# Patient Record
Sex: Male | Born: 1965 | Race: White | Hispanic: Yes | Marital: Married | Smoking: Former smoker
Health system: Southern US, Community
[De-identification: ages and names within clinical notes are randomized; demographics above are authoritative.]

## PROBLEM LIST (undated history)

## (undated) DIAGNOSIS — E785 Hyperlipidemia, unspecified: Secondary | ICD-10-CM

## (undated) DIAGNOSIS — Z952 Presence of prosthetic heart valve: Secondary | ICD-10-CM

## (undated) DIAGNOSIS — I350 Nonrheumatic aortic (valve) stenosis: Secondary | ICD-10-CM

## (undated) DIAGNOSIS — R011 Cardiac murmur, unspecified: Secondary | ICD-10-CM

## (undated) DIAGNOSIS — I209 Angina pectoris, unspecified: Secondary | ICD-10-CM

## (undated) DIAGNOSIS — E7800 Pure hypercholesterolemia, unspecified: Secondary | ICD-10-CM

## (undated) DIAGNOSIS — E119 Type 2 diabetes mellitus without complications: Secondary | ICD-10-CM

## (undated) DIAGNOSIS — E669 Obesity, unspecified: Secondary | ICD-10-CM

## (undated) HISTORY — DX: Presence of prosthetic heart valve: Z95.2

## (undated) HISTORY — DX: Cardiac murmur, unspecified: R01.1

## (undated) HISTORY — PX: CARDIAC CATHETERIZATION: SHX172

## (undated) HISTORY — DX: Type 2 diabetes mellitus without complications: E11.9

## (undated) HISTORY — PX: LEG SURGERY: SHX1003

## (undated) HISTORY — DX: Hyperlipidemia, unspecified: E78.5

## (undated) HISTORY — DX: Nonrheumatic aortic (valve) stenosis: I35.0

## (undated) HISTORY — DX: Obesity, unspecified: E66.9

---
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
## 2003-10-30 ENCOUNTER — Emergency Department (HOSPITAL_COMMUNITY): Admission: EM | Admit: 2003-10-30 | Discharge: 2003-10-30 | Payer: Self-pay | Admitting: Emergency Medicine

## 2012-10-26 ENCOUNTER — Encounter: Payer: Self-pay | Admitting: Cardiovascular Disease

## 2012-10-26 ENCOUNTER — Ambulatory Visit (INDEPENDENT_AMBULATORY_CARE_PROVIDER_SITE_OTHER): Payer: Self-pay | Admitting: Cardiovascular Disease

## 2012-10-26 VITALS — BP 100/62 | HR 62 | Ht 65.0 in | Wt 203.2 lb

## 2012-10-26 DIAGNOSIS — R011 Cardiac murmur, unspecified: Secondary | ICD-10-CM

## 2012-10-26 DIAGNOSIS — I359 Nonrheumatic aortic valve disorder, unspecified: Secondary | ICD-10-CM

## 2012-10-26 DIAGNOSIS — R079 Chest pain, unspecified: Secondary | ICD-10-CM | POA: Insufficient documentation

## 2012-10-26 DIAGNOSIS — I350 Nonrheumatic aortic (valve) stenosis: Secondary | ICD-10-CM

## 2012-10-26 NOTE — Assessment & Plan Note (Signed)
Somewhat worrisime in setting of AS but quality is atypical.  If AS not servere on echo can f/u with ETT since ECG is normal

## 2012-10-26 NOTE — Assessment & Plan Note (Signed)
Likely bicuspid valve. Moderate AS on exam  F/U echo Used model to discuss diagnosis with patient through intepreter

## 2012-10-26 NOTE — Progress Notes (Signed)
Patient ID: Kevin Barron, male   DOB: 09/06/1965, 47 y.o.   MRN: [id number] 47 [facility] and history of murmur.  SSCP last 8 months. No rhyme or reason to onset. Not always exertional. Center of chest and radiates to arms and neck.  Sharp quality.  No dyspnea. Occasional dizzyness.  Indicates history of murmur but no w/u or [place].  Seen at clinic 10/21/12 and given nitro and started on beta blocker.  Pains can be intermitant most of the day. 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  History done through spanish intepreter.  No syncope He enjoys golf and can do this without difficulty.  Has not tried anything to relieve pain  ROS: Denies fever, malais, weight loss, blurry vision, decreased visual acuity, cough, sputum, SOB, hemoptysis, pleuritic pain, palpitaitons, heartburn, abdominal pain, melena, lower extremity edema, claudication, or rash.  All other systems reviewed and negative   General: Affect appropriate Healthy:  appears stated age HEENT: normal Neck supple with no adenopathy JVP normal no bruits no thyromegaly Lungs clear with no wheezing and good diaphragmatic motion Heart:  S1/S2 oreserved niderate AS  murmur,rub, gallop or click PMI normal Abdomen: benighn, BS positve, no tenderness, no AAA no bruit.  No HSM or HJR Distal pulses intact with no bruits No edema Neuro non-focal Skin warm and dry No muscular weakness  Medications Current Outpatient Prescriptions  Medication Sig Dispense Refill  . metoprolol tartrate (LOPRESSOR) 25 MG tablet Take 25 mg by mouth 2 (two) times daily.      . nitroGLYCERIN (NITROSTAT) 0.4 MG SL tablet Place 0.4 mg under the tongue every 5 (five) minutes as needed for chest pain.       No current facility-administered medications for this visit.    Allergies Review of patient's allergies indicates not on file.  Family History: No family history on file.  Social History: History   Social  History  . Marital Status: Married    Spouse Name: N/A    Number of Children: N/A  . Years of Education: N/A   Occupational History  . Not on file.   Social History Main Topics  . Smoking status: Never Smoker   . Smokeless tobacco: Not on file  . Alcohol Use: Not on file  . Drug Use: Not on file  . Sexually Active: Not on file   Other Topics Concern  . Not on file   Social History Narrative  . No narrative on file    Electrocardiogram:  10/21/12  SR rate 69  normal  Assessment and Plan

## 2012-10-26 NOTE — Patient Instructions (Addendum)
Your physician has requested that you have an echocardiogram. Echocardiography is a painless test that uses sound waves to create images of your heart. It provides your doctor with information about the size and shape of your heart and how well your heart's chambers and valves are working. This procedure takes approximately one hour. There are no restrictions for this procedure.  Schedule treadmill 2 days after echo   Your physician wants you to follow-up in: 6months. You will receive a reminder letter in the mail two months in advance. If you don't receive a letter, please call our office to schedule the follow-up appointment.

## 2012-10-28 ENCOUNTER — Encounter (HOSPITAL_COMMUNITY): Payer: Self-pay

## 2012-10-28 ENCOUNTER — Ambulatory Visit (HOSPITAL_COMMUNITY)
Admission: RE | Admit: 2012-10-28 | Discharge: 2012-10-28 | Disposition: A | Discharge status: Home / Self Care | Payer: Self-pay | Source: Ambulatory Visit | Attending: Cardiovascular Disease | Admitting: Cardiovascular Disease

## 2012-10-28 DIAGNOSIS — I359 Nonrheumatic aortic valve disorder, unspecified: Secondary | ICD-10-CM

## 2012-10-28 DIAGNOSIS — I350 Nonrheumatic aortic (valve) stenosis: Secondary | ICD-10-CM

## 2012-10-28 DIAGNOSIS — R011 Cardiac murmur, unspecified: Secondary | ICD-10-CM | POA: Insufficient documentation

## 2012-10-28 DIAGNOSIS — Z87891 Personal history of nicotine dependence: Secondary | ICD-10-CM | POA: Insufficient documentation

## 2012-10-28 DIAGNOSIS — R079 Chest pain, unspecified: Secondary | ICD-10-CM | POA: Insufficient documentation

## 2012-10-28 NOTE — Progress Notes (Signed)
*  PRELIMINARY RESULTS* Echocardiogram 2D Echocardiogram  has been performed.  Kevin Barron 10/28/2012, 9:13 AM   Veda Canning, RDCS

## 2012-10-30 ENCOUNTER — Ambulatory Visit (HOSPITAL_COMMUNITY)
Admission: RE | Admit: 2012-10-30 | Discharge: 2012-10-30 | Disposition: A | Discharge status: Home / Self Care | Payer: Self-pay | Source: Ambulatory Visit | Attending: Cardiovascular Disease | Admitting: Cardiovascular Disease

## 2012-10-30 DIAGNOSIS — R079 Chest pain, unspecified: Secondary | ICD-10-CM

## 2012-10-30 DIAGNOSIS — I350 Nonrheumatic aortic (valve) stenosis: Secondary | ICD-10-CM

## 2012-10-30 DIAGNOSIS — R011 Cardiac murmur, unspecified: Secondary | ICD-10-CM | POA: Insufficient documentation

## 2012-10-30 NOTE — Progress Notes (Addendum)
Stress Lab Nurses Notes - Aniketh Huberty Hernandez-Garcia 10/30/2012 Reason for doing test: Chest Pain and & Murmur Type of test: Regular GTX Nurse performing test: Parke Poisson, RN Nuclear Medicine Tech: Not Applicable Echo Tech: Not Applicable MD performing test: R. Keely Drennan & Joni Reining NP Family [facility]: [facility] Test explained and consent signed: yes IV started: No IV started Symptoms: left leg pain Treatment/Intervention: None Reason test stopped: fatigue & Left leg pain After recovery IV was: na Patient to return to Nuc. Med at : NA Patient discharged: Home Patient's Condition upon discharge was: stable Comments: During test peak BP 160/79 & HR 133.  Recovery BP 121/66 & HR 89.  Symptoms resolved in recovery. Erskine Speed T  Graded Exercise Test-Interpretation      Graded exercise performed to a work load of 10.1 METs and a heart rate of 133, 77 % of age-predicted maximum.  Exercise discontinued due to generalized and leg fatigue; no chest discomfort reported.  Blood pressure increased from a resting value of 100/70 to 160/80 at peak exercise.  No significant arrhythmias noted-few PACs and PVCs.  EKG: Normal sinus rhythm; right ventricular conduction delay; modest J-point elevation; otherwise normal. Stress EKG:  No significant change Impression:  Negative graded exercise test at a somewhat submaximal heart rate but fairly good level of exercise  Other findings as noted.  Sabana Eneas Bing, M.D.

## 2013-05-20 ENCOUNTER — Encounter: Payer: Self-pay | Admitting: *Deleted

## 2013-05-24 ENCOUNTER — Encounter: Payer: Self-pay | Admitting: Cardiovascular Disease

## 2013-05-24 ENCOUNTER — Encounter: Payer: Self-pay | Admitting: *Deleted

## 2013-05-24 ENCOUNTER — Ambulatory Visit (INDEPENDENT_AMBULATORY_CARE_PROVIDER_SITE_OTHER): Payer: Self-pay | Admitting: Cardiovascular Disease

## 2013-05-24 VITALS — BP 110/80 | HR 66 | Ht 69.0 in | Wt 189.0 lb

## 2013-05-24 DIAGNOSIS — R011 Cardiac murmur, unspecified: Secondary | ICD-10-CM

## 2013-05-24 DIAGNOSIS — I350 Nonrheumatic aortic (valve) stenosis: Secondary | ICD-10-CM

## 2013-05-24 DIAGNOSIS — R079 Chest pain, unspecified: Secondary | ICD-10-CM

## 2013-05-24 DIAGNOSIS — I359 Nonrheumatic aortic valve disorder, unspecified: Secondary | ICD-10-CM

## 2013-05-24 NOTE — Progress Notes (Signed)
Patient ID: Kevin Barron, male   DOB: 1966/04/13, 47 y.o.   MRN: [id number] 46 yo [facility] April 2014  for chest pain and history of murmur. SSCP last 8 months. No rhyme or reason to onset. Not always exertional. Center of chest and radiates to arms and neck. Sharp quality. No dyspnea. Occasional dizzyness. Indicates history of murmur but no w/u or [place]. Seen at clinic 10/21/12 and given nitro and started on beta blocker. Pains can be intermitant most of the day. 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 History done through spanish intepreter. No syncope He enjoys golf and can do this without difficulty. Has not tried anything to relieve pain  F/U echo : with bicuspid AV with moderate AS mean gradient 16 and peak 31 4"14 10/30/12 Had normal ETT    - Left ventricle: The cavity size was normal. Wall thickness was at the upper limits of normal. Systolic function was normal. The estimated ejection fraction was in the range of 60% to 65%. Wall motion was normal; there were no regional wall motion abnormalities. Features are consistent with a pseudonormal left ventricular filling pattern, with concomitant abnormal relaxation and increased filling pressure (grade 2 diastolic dysfunction). - Aortic valve: Moderately calcified annulus. Bicuspid. There was moderate stenosis. Trivial regurgitation. Mean gradient: 16mm Hg (S). Peak gradient: 31mm Hg (S). Valve area: 0.91cm^2(VTI). Valve area 1.3 cm2 by planimetry - difficult to visualize. The LVOT/AV VTI ratio is 0.30. No prior study for comparison. - Mitral valve: Calcified annulus. Trivial regurgitation. - Left atrium: The atrium was at the upper limits of normal in size. - Right atrium: The atrium was mildly dilated. Central venous pressure: 8mm Hg (est). - Tricuspid valve: Trivial regurgitation. - Pulmonary arteries: PA peak pressure: 27mm Hg (S). - Pericardium, extracardiac: There was no  pericardial Effusion.   Needs f/u echo in April discussed natural history of bicuspid AV and AS   Has not had CXR to screen mediastinum and assess aortic root size   ROS: Denies fever, malais, weight loss, blurry vision, decreased visual acuity, cough, sputum, SOB, hemoptysis, pleuritic pain, palpitaitons, heartburn, abdominal pain, melena, lower extremity edema, claudication, or rash.  All other systems reviewed and negative  General: Affect appropriate Healthy:  appears stated age HEENT: normal Neck supple with no adenopathy JVP normal no bruits no thyromegaly Lungs clear with no wheezing and good diaphragmatic motion Heart:  S1/S2 preserved moderate AS  murmur, no rub, gallop or click PMI normal Abdomen: benighn, BS positve, no tenderness, no AAA no bruit.  No HSM or HJR Distal pulses intact with no bruits No edema Neuro non-focal Skin warm and dry No muscular weakness   Current Outpatient Prescriptions  Medication Sig Dispense Refill  . metoprolol tartrate (LOPRESSOR) 25 MG tablet Take 25 mg by mouth 2 (two) times daily.      . nitroGLYCERIN (NITROSTAT) 0.4 MG SL tablet Place 0.4 mg under the tongue every 5 (five) minutes as needed for chest pain.       No current facility-administered medications for this visit.    Allergies  Review of patient's allergies indicates not on file.  Electrocardiogram: 10/21/12 SR rate 66 LAD nonspecific ST/T wave changes  Assessment and Plan

## 2013-05-24 NOTE — Assessment & Plan Note (Signed)
F/U stress myovue given ST/ Twave changes and LAD.  AS not bad enough to need lexiscan

## 2013-05-24 NOTE — Assessment & Plan Note (Signed)
F/U echo in April S 2 still preserved.  CXR today to screen ascending root.

## 2013-05-24 NOTE — Patient Instructions (Signed)
Your physician recommends that you schedule a follow-up appointment in: 6 months in the [facility] OFFICE WITH Dr. Eden Emms   A chest x-ray takes a picture of the organs and structures inside the chest, including the heart, lungs, and blood vessels. This test can show several things, including, whether the heart is enlarges; whether fluid is building up in the lungs; and whether pacemaker / defibrillator leads are still in place.  Your physician has requested that you have an echocardiogram. Echocardiography is a painless test that uses sound waves to create images of your heart. It provides your doctor with information about the size and shape of your heart and how well your heart's chambers and valves are working. This procedure takes approximately one hour. There are no restrictions for this procedure.IN April WE WILL CALL YOU WITH THE APT DATE AND TIME TO BE SCHEDULED FOR THIS TEST  Your physician has requested that you have en exercise stress myoview. For further information please visit [email or web address] Please follow instruction sheet, as given.  WE WILL CALL YOU WITH YOUR TEST RESULTS/INSTRUCTIONS/NEXT STEPS ONCE RECEIVED BY THE PROVIDER

## 2013-05-26 ENCOUNTER — Encounter (HOSPITAL_COMMUNITY)
Admission: RE | Admit: 2013-05-26 | Discharge: 2013-05-26 | Disposition: A | Discharge status: Home / Self Care | Payer: Self-pay | Source: Ambulatory Visit | Attending: Cardiovascular Disease | Admitting: Cardiovascular Disease

## 2013-05-26 ENCOUNTER — Encounter (HOSPITAL_COMMUNITY): Payer: Self-pay

## 2013-05-26 DIAGNOSIS — R079 Chest pain, unspecified: Secondary | ICD-10-CM | POA: Insufficient documentation

## 2013-05-26 DIAGNOSIS — R011 Cardiac murmur, unspecified: Secondary | ICD-10-CM

## 2013-05-26 DIAGNOSIS — I350 Nonrheumatic aortic (valve) stenosis: Secondary | ICD-10-CM

## 2013-05-26 MED ORDER — SODIUM CHLORIDE 0.9 % IJ SOLN
INTRAMUSCULAR | Status: AC
  Administered 2013-05-26: 10 mL via INTRAVENOUS
  Filled 2013-05-26: qty 10

## 2013-05-26 MED ORDER — TECHNETIUM TC 99M SESTAMIBI - CARDIOLITE
10.0000 | Freq: Once | INTRAVENOUS | Status: AC | PRN
  Administered 2013-05-26: 10 via INTRAVENOUS

## 2013-05-26 MED ORDER — TECHNETIUM TC 99M SESTAMIBI - CARDIOLITE
30.0000 | Freq: Once | INTRAVENOUS | Status: AC | PRN
  Administered 2013-05-26: 12:00:00 30 via INTRAVENOUS

## 2013-05-26 MED ORDER — REGADENOSON 0.4 MG/5ML IV SOLN
INTRAVENOUS | Status: AC
  Filled 2013-05-26: qty 5

## 2013-05-26 NOTE — Progress Notes (Signed)
Stress Lab Nurses Notes - Brnadon Eoff Hernandez-Garcia 05/26/2013 Reason for doing test: Chest Pain Type of test: Stress Cardiolite Nurse performing test: Parke Poisson, RN Nuclear Medicine Tech: Lyndel Pleasure Echo Tech: Not Applicable MD performing test: Ival Bible /M. Geni Bers PA Family MD: Jacquelin Hawking PA Test explained and consent signed: yes IV started: 22g jelco, Saline lock flushed, No redness or edema and Saline lock started in radiology Symptoms: None Treatment/Intervention: None Reason test stopped: fatigue After recovery IV was: Discontinued via X-ray tech and No redness or edema Patient to return to Nuc. Med at : 1:15 Patient discharged: Home Patient's Condition upon discharge was: stable Comments: During test peak BP 165/83 & HR 166.  Recovery BP 115/78 & HR 90.  Symptoms resolved in recovery. Erskine Speed T

## 2013-05-27 ENCOUNTER — Encounter: Payer: Self-pay | Admitting: *Deleted

## 2014-03-12 ENCOUNTER — Emergency Department (HOSPITAL_COMMUNITY)
Admission: EM | Admit: 2014-03-12 | Discharge: 2014-03-13 | Disposition: A | Discharge status: Home / Self Care | Payer: Self-pay | Attending: Emergency Medicine | Admitting: Emergency Medicine

## 2014-03-12 ENCOUNTER — Encounter (HOSPITAL_COMMUNITY): Payer: Self-pay | Admitting: Emergency Medicine

## 2014-03-12 DIAGNOSIS — H9209 Otalgia, unspecified ear: Secondary | ICD-10-CM | POA: Insufficient documentation

## 2014-03-12 DIAGNOSIS — J029 Acute pharyngitis, unspecified: Secondary | ICD-10-CM | POA: Insufficient documentation

## 2014-03-12 DIAGNOSIS — J039 Acute tonsillitis, unspecified: Secondary | ICD-10-CM | POA: Insufficient documentation

## 2014-03-12 MED ORDER — IBUPROFEN 800 MG PO TABS
800.0000 mg | ORAL_TABLET | Freq: Once | ORAL | Status: AC
  Administered 2014-03-13: 800 mg via ORAL
  Filled 2014-03-12: qty 1

## 2014-03-13 LAB — RAPID STREP SCREEN (MED CTR MEBANE ONLY): Streptococcus, Group A Screen (Direct): NEGATIVE

## 2014-03-13 MED ORDER — AMOXICILLIN 500 MG PO CAPS: 500.0000 mg | ORAL_CAPSULE | Freq: Three times a day (TID) | ORAL | Status: AC

## 2014-03-13 MED ORDER — MAGIC MOUTHWASH W/LIDOCAINE: 10.0000 mL | Freq: Four times a day (QID) | ORAL | Status: DC | PRN

## 2014-03-13 NOTE — Discharge Instructions (Signed)
Tonsillitis °Tonsillitis is an infection of the throat. This infection causes the tonsils to become red, tender, and puffy (swollen). Tonsils are groups of tissue at the back of your throat. If bacteria caused your infection, antibiotic medicine will be given to you. Sometimes symptoms of tonsillitis can be relieved with the use of steroid medicine. If your tonsillitis is severe and happens often, you may need to get your tonsils removed (tonsillectomy). °HOME CARE  °· Rest and sleep often. °· Drink enough fluids to keep your pee (urine) clear or pale yellow. °· While your throat is sore, eat soft or liquid foods like: °¨ Soup. °¨ Ice cream. °¨ Instant breakfast drinks. °· Eat frozen ice pops. °· Gargle with a warm or cold liquid to help soothe the throat. Gargle with a water and salt mix. Mix 1/4 teaspoon of salt and 1/4 teaspoon of baking soda in 1 cup of water. °· Only take medicines as told by your doctor. °· If you are given medicines (antibiotics), take them as told. Finish them even if you start to feel better. °GET HELP IF: °· You have large, tender lumps in your neck. °· You have a rash. °· You cough up green, yellow-brown, or bloody fluid. °· You cannot swallow liquids or food for 24 hours. °· You notice that only one of your tonsils is swollen. °GET HELP RIGHT AWAY IF:  °· You throw up (vomit). °· You have a very bad headache. °· You have a stiff neck. °· You have chest pain. °· You have trouble breathing or swallowing. °· You have bad throat pain, drooling, or your voice changes. °· You have bad pain not helped by medicine. °· You cannot fully open your mouth. °· You have redness, puffiness, or bad pain in the neck. °· You have a fever. °MAKE SURE YOU:  °· Understand these instructions. °· Will watch your condition. °· Will get help right away if you are not doing well or get worse. °Document Released: 12/11/2007 Document Revised: 06/29/2013 Document Reviewed: 12/11/2012 °[employer]® Patient Information  ©2015 [employer]. This information is not intended to replace advice given to you by your health care provider. Make sure you discuss any questions you have with your health care provider. ° °

## 2014-03-14 NOTE — ED Provider Notes (Signed)
CSN: [id number]     Arrival date & time 03/12/14  2241 History   First MD Initiated Contact with Patient 03/12/14 2318     Chief Complaint  Patient presents with  . Sore Throat  . Fever  . Otalgia     (Consider location/radiation/quality/duration/timing/severity/associated sxs/prior Treatment) The history is provided by the patient and the spouse. The history is limited by a language barrier. A language interpreter was used.   Kevin Barron is a 48 y.o. male presenting with a 4 day history of uri type symptoms which includes sore throat, fever to 101 and left bilateral ear pain which is worsened with swallowing.  Symptoms due to not include shortness of breath, chest pain,  Nausea, vomiting or diarrhea.  The patient has taken no medications prior to arrival for his symptoms.  He has not been around any sick contacts to his knowledge, no recent foreign travel.  He works as a Dealer.     History reviewed. No pertinent past medical history. History reviewed. No pertinent past surgical history. History reviewed. No pertinent family history. History  Substance Use Topics  . Smoking status: Never Smoker   . Smokeless tobacco: Not on file  . Alcohol Use: No    Review of Systems  Constitutional: Positive for fever, chills and fatigue.  HENT: Positive for ear pain and sore throat. Negative for congestion, ear discharge, rhinorrhea, sinus pressure, trouble swallowing and voice change.   Eyes: Negative for discharge.  Respiratory: Negative for cough, shortness of breath, wheezing and stridor.   Cardiovascular: Negative for chest pain.  Gastrointestinal: Negative for abdominal pain.  Genitourinary: Negative.   Neurological: Negative for dizziness, facial asymmetry, weakness and headaches.      Allergies  Review of patient's allergies indicates no known allergies.  Home Medications   Prior to Admission medications   Medication Sig Start Date End Date Taking? Authorizing  Provider  Alum & Mag Hydroxide-Simeth (MAGIC MOUTHWASH W/LIDOCAINE) SOLN Take 10 mLs by mouth 4 (four) times daily as needed (throat pain). 03/13/14   Evalee Jefferson, PA-C  amoxicillin (AMOXIL) 500 MG capsule Take 1 capsule (500 mg total) by mouth 3 (three) times daily. 03/13/14 03/23/14  Evalee Jefferson, PA-C   BP 136/77  Pulse 95  Temp(Src) 99.9 F (37.7 C) (Oral)  Resp 20  Ht 5\' 7"  (1.702 m)  Wt 185 lb (83.915 kg)  BMI 28.97 kg/m2  SpO2 95% Physical Exam  Constitutional: He is oriented to person, place, and time. He appears well-developed and well-nourished.  HENT:  Head: Normocephalic and atraumatic.  Right Ear: Tympanic membrane, external ear and ear canal normal.  Left Ear: Tympanic membrane, external ear and ear canal normal.  Nose: No mucosal edema or rhinorrhea. Right sinus exhibits no maxillary sinus tenderness and no frontal sinus tenderness. Left sinus exhibits no maxillary sinus tenderness and no frontal sinus tenderness.  Mouth/Throat: Uvula is midline and mucous membranes are normal. No trismus in the jaw. No uvula swelling. Oropharyngeal exudate, posterior oropharyngeal edema and posterior oropharyngeal erythema present. No tonsillar abscesses.  2+ bilateral tonsillar hypertrophy  Eyes: Conjunctivae are normal.  Cardiovascular: Normal rate and normal heart sounds.   Pulmonary/Chest: Effort normal. No respiratory distress. He has no wheezes. He has no rales.  Abdominal: Soft. There is no tenderness.  Musculoskeletal: Normal range of motion.  Lymphadenopathy:       Head (right side): Tonsillar adenopathy present.       Head (left side): Tonsillar adenopathy present.  Neurological: He is alert  and oriented to person, place, and time.  Skin: Skin is warm and dry. No rash noted.  Psychiatric: He has a normal mood and affect.    ED Course  Procedures (including critical care time) Labs Review Labs Reviewed  RAPID STREP SCREEN  CULTURE, GROUP A STREP    Imaging Review No  results found.   EKG Interpretation None      MDM   Final diagnoses:  Acute tonsillitis    Patients labs and/or radiological studies were viewed and considered during the medical decision making and disposition process. Strep culture pending.  Pt with acute tonsillitis.  Will tx with amoxil, also encouraged ibuprofen for fever, pain relief.  Prescribed magic mouthwash for additonal pain relief.  Recheck here for any worsened sx or if not improving over the weekend.  Rest, increased fluid intake.    Evalee Jefferson, PA-C 03/14/14 2323

## 2014-03-15 LAB — CULTURE, GROUP A STREP

## 2014-03-15 NOTE — ED Provider Notes (Signed)
Medical screening examination/treatment/procedure(s) were performed by non-physician practitioner and as supervising physician I was immediately available for consultation/collaboration.    Delora Fuel, MD 94/07/68 0881

## 2014-03-16 ENCOUNTER — Encounter (HOSPITAL_COMMUNITY): Payer: Self-pay | Admitting: Emergency Medicine

## 2014-03-23 ENCOUNTER — Encounter (HOSPITAL_COMMUNITY): Payer: Self-pay | Admitting: Emergency Medicine

## 2014-03-23 ENCOUNTER — Emergency Department (HOSPITAL_COMMUNITY): Payer: Self-pay

## 2014-03-23 ENCOUNTER — Observation Stay (HOSPITAL_COMMUNITY)
Admission: EM | Admit: 2014-03-23 | Discharge: 2014-03-24 | Disposition: A | Discharge status: Home / Self Care | Payer: Self-pay | Attending: Family Medicine | Admitting: Family Medicine

## 2014-03-23 DIAGNOSIS — R112 Nausea with vomiting, unspecified: Secondary | ICD-10-CM

## 2014-03-23 DIAGNOSIS — E669 Obesity, unspecified: Secondary | ICD-10-CM | POA: Insufficient documentation

## 2014-03-23 DIAGNOSIS — Q251 Coarctation of aorta: Principal | ICD-10-CM | POA: Insufficient documentation

## 2014-03-23 DIAGNOSIS — R42 Dizziness and giddiness: Secondary | ICD-10-CM

## 2014-03-23 DIAGNOSIS — J039 Acute tonsillitis, unspecified: Secondary | ICD-10-CM

## 2014-03-23 DIAGNOSIS — I9589 Other hypotension: Secondary | ICD-10-CM

## 2014-03-23 DIAGNOSIS — Q231 Congenital insufficiency of aortic valve: Secondary | ICD-10-CM

## 2014-03-23 DIAGNOSIS — I350 Nonrheumatic aortic (valve) stenosis: Secondary | ICD-10-CM

## 2014-03-23 DIAGNOSIS — R55 Syncope and collapse: Secondary | ICD-10-CM | POA: Diagnosis present

## 2014-03-23 DIAGNOSIS — E860 Dehydration: Secondary | ICD-10-CM

## 2014-03-23 DIAGNOSIS — I959 Hypotension, unspecified: Secondary | ICD-10-CM | POA: Diagnosis present

## 2014-03-23 DIAGNOSIS — Q2529 Other atresia of aorta: Principal | ICD-10-CM

## 2014-03-23 DIAGNOSIS — R011 Cardiac murmur, unspecified: Secondary | ICD-10-CM

## 2014-03-23 LAB — COMPREHENSIVE METABOLIC PANEL
ALT: 30 U/L (ref 0–53)
AST: 19 U/L (ref 0–37)
Albumin: 4.1 g/dL (ref 3.5–5.2)
Alkaline Phosphatase: 73 U/L (ref 39–117)
Anion gap: 14 (ref 5–15)
BUN: 17 mg/dL (ref 6–23)
CALCIUM: 9.4 mg/dL (ref 8.4–10.5)
CHLORIDE: 98 meq/L (ref 96–112)
CO2: 26 meq/L (ref 19–32)
CREATININE: 0.81 mg/dL (ref 0.50–1.35)
GLUCOSE: 124 mg/dL — AB (ref 70–99)
Potassium: 4.4 mEq/L (ref 3.7–5.3)
Sodium: 138 mEq/L (ref 137–147)
Total Bilirubin: 0.8 mg/dL (ref 0.3–1.2)
Total Protein: 8.7 g/dL — ABNORMAL HIGH (ref 6.0–8.3)

## 2014-03-23 LAB — CBC WITH DIFFERENTIAL/PLATELET
Basophils Absolute: 0 10*3/uL (ref 0.0–0.1)
Basophils Relative: 0 % (ref 0–1)
EOS PCT: 0 % (ref 0–5)
Eosinophils Absolute: 0 10*3/uL (ref 0.0–0.7)
HEMATOCRIT: 43.4 % (ref 39.0–52.0)
HEMOGLOBIN: 15.1 g/dL (ref 13.0–17.0)
LYMPHS ABS: 1.3 10*3/uL (ref 0.7–4.0)
LYMPHS PCT: 13 % (ref 12–46)
MCH: 31.3 pg (ref 26.0–34.0)
MCHC: 34.8 g/dL (ref 30.0–36.0)
MCV: 90 fL (ref 78.0–100.0)
MONO ABS: 0.6 10*3/uL (ref 0.1–1.0)
MONOS PCT: 6 % (ref 3–12)
NEUTROS ABS: 8.3 10*3/uL — AB (ref 1.7–7.7)
Neutrophils Relative %: 81 % — ABNORMAL HIGH (ref 43–77)
Platelets: 500 10*3/uL — ABNORMAL HIGH (ref 150–400)
RBC: 4.82 MIL/uL (ref 4.22–5.81)
RDW: 13.1 % (ref 11.5–15.5)
WBC: 10.3 10*3/uL (ref 4.0–10.5)

## 2014-03-23 LAB — TROPONIN I

## 2014-03-23 LAB — LIPASE, BLOOD: Lipase: 52 U/L (ref 11–59)

## 2014-03-23 MED ORDER — SODIUM CHLORIDE 0.9 % IV SOLN
INTRAVENOUS | Status: DC
  Administered 2014-03-23: via INTRAVENOUS

## 2014-03-23 NOTE — ED Provider Notes (Signed)
CSN: [id number]     Arrival date & time 03/23/14  1858 History   First MD Initiated Contact with Patient 03/23/14 2300     Chief Complaint  Patient presents with  . Fatigue  . Nausea  . Emesis      HPI Pt was seen at 2300. Per pt and his family, c/o gradual onset and persistence of constant "dizziness" that began this morning. Pt describes his dizziness as "feeling like I'm going to pass out." Has been associated with several episodes of N/V. Pt states he was recently treated for a "sore throat with PCN" which has improved. Denies fevers, no diarrhea, no black or blood in emesis, no CP/palpitations, no cough/SOB, no abd pain, no back pain, no neck pain, no syncope, no focal motor weakness, no tingling/numbness in extremities.    Past Medical History  Diagnosis Date  . Murmur   . Obesity   . Hypertension    History reviewed. No pertinent past surgical history.  History  Substance Use Topics  . Smoking status: Never Smoker   . Smokeless tobacco: Not on file  . Alcohol Use: No    Review of Systems ROS: Statement: All systems negative except as marked or noted in the HPI; Constitutional: Negative for fever and chills. +generalized weakness/fatigue. ; ; Eyes: Negative for eye pain, redness and discharge. ; ; ENMT: Negative for ear pain, hoarseness, nasal congestion, sinus pressure and sore throat. ; ; Cardiovascular: Negative for chest pain, palpitations, diaphoresis, dyspnea and peripheral edema. ; ; Respiratory: Negative for cough, wheezing and stridor. ; ; Gastrointestinal: +N/V. Negative for diarrhea, abdominal pain, blood in stool, hematemesis, jaundice and rectal bleeding. . ; ; Genitourinary: Negative for dysuria, flank pain and hematuria. ; ; Musculoskeletal: Negative for back pain and neck pain. Negative for swelling and trauma.; ; Skin: Negative for pruritus, rash, abrasions, blisters, bruising and skin lesion.; ; Neuro: Negative for headache and neck stiffness. Negative for  altered level of consciousness , altered mental status, extremity weakness, paresthesias, involuntary movement, seizure and syncope.     Allergies  Review of patient's allergies indicates no known allergies.  Home Medications   Prior to Admission medications   Medication Sig Start Date End Date Taking? Authorizing Provider  Alum & Mag Hydroxide-Simeth (MAGIC MOUTHWASH W/LIDOCAINE) SOLN Take 10 mLs by mouth 4 (four) times daily as needed (throat pain). 03/13/14   Evalee Jefferson, PA-C  amoxicillin (AMOXIL) 500 MG capsule Take 1 capsule (500 mg total) by mouth 3 (three) times daily. 03/13/14 03/23/14  Evalee Jefferson, PA-C  nitroGLYCERIN (NITROSTAT) 0.4 MG SL tablet Place 0.4 mg under the tongue every 5 (five) minutes as needed for chest pain.    Historical Provider, MD   BP 101/73  Pulse 79  Temp(Src) 98.1 F (36.7 C) (Oral)  Resp 16  Wt 183 lb (83.008 kg)  SpO2 97% Physical Exam 2305: Physical examination:  Nursing notes reviewed; Vital signs and O2 SAT reviewed;  Constitutional: Well developed, Well nourished, In no acute distress; Head:  Normocephalic, atraumatic; Eyes: EOMI, PERRL, No scleral icterus; ENMT: TM's clear bilat. +edemetous nasal turbinates bilat with clear rhinorrhea. Mouth and pharynx without lesions. No tonsillar exudates. No intra-oral edema. No submandibular or sublingual edema. No hoarse voice, no drooling, no stridor. No pain with manipulation of larynx. No trismus. Mouth and pharynx normal, Mucous membranes dry; Neck: Supple, No meningeal signs. Full range of motion, No lymphadenopathy; Cardiovascular: Regular rate and rhythm, No gallop; Respiratory: Breath sounds clear & equal bilaterally, No  rales, rhonchi, wheezes.  Speaking full sentences with ease, Normal respiratory effort/excursion; Chest: Nontender, Movement normal; Abdomen: Soft, Nontender, Nondistended, Normal bowel sounds; Genitourinary: No CVA tenderness; Extremities: Pulses normal, No tenderness, No edema, No calf edema or  asymmetry.; Neuro: AA&Ox3, Major CN grossly intact.  Speech clear. No gross focal motor or sensory deficits in extremities.; Skin: Color normal, Warm, Dry.   ED Course  Procedures     EKG Interpretation None      MDM  MDM Reviewed: previous chart, nursing note and vitals Reviewed previous: labs and ECG Interpretation: labs, x-ray and ECG     Results for orders placed during the hospital encounter of 03/23/14  CBC WITH DIFFERENTIAL      Result Value Ref Range   WBC 10.3  4.0 - 10.5 K/uL   RBC 4.82  4.22 - 5.81 MIL/uL   Hemoglobin 15.1  13.0 - 17.0 g/dL   HCT 43.4  39.0 - 52.0 %   MCV 90.0  78.0 - 100.0 fL   MCH 31.3  26.0 - 34.0 pg   MCHC 34.8  30.0 - 36.0 g/dL   RDW 13.1  11.5 - 15.5 %   Platelets 500 (*) 150 - 400 K/uL   Neutrophils Relative % 81 (*) 43 - 77 %   Neutro Abs 8.3 (*) 1.7 - 7.7 K/uL   Lymphocytes Relative 13  12 - 46 %   Lymphs Abs 1.3  0.7 - 4.0 K/uL   Monocytes Relative 6  3 - 12 %   Monocytes Absolute 0.6  0.1 - 1.0 K/uL   Eosinophils Relative 0  0 - 5 %   Eosinophils Absolute 0.0  0.0 - 0.7 K/uL   Basophils Relative 0  0 - 1 %   Basophils Absolute 0.0  0.0 - 0.1 K/uL  COMPREHENSIVE METABOLIC PANEL      Result Value Ref Range   Sodium 138  137 - 147 mEq/L   Potassium 4.4  3.7 - 5.3 mEq/L   Chloride 98  96 - 112 mEq/L   CO2 26  19 - 32 mEq/L   Glucose, Bld 124 (*) 70 - 99 mg/dL   BUN 17  6 - 23 mg/dL   Creatinine, Ser 0.81  0.50 - 1.35 mg/dL   Calcium 9.4  8.4 - 10.5 mg/dL   Total Protein 8.7 (*) 6.0 - 8.3 g/dL   Albumin 4.1  3.5 - 5.2 g/dL   AST 19  0 - 37 U/L   ALT 30  0 - 53 U/L   Alkaline Phosphatase 73  39 - 117 U/L   Total Bilirubin 0.8  0.3 - 1.2 mg/dL   GFR calc non Af Amer >90  >90 mL/min   GFR calc Af Amer >90  >90 mL/min   Anion gap 14  5 - 15  TROPONIN I      Result Value Ref Range   Troponin I <0.30  <0.30 ng/mL  URINALYSIS, ROUTINE W REFLEX MICROSCOPIC      Result Value Ref Range   Color, Urine YELLOW  YELLOW    APPearance CLEAR  CLEAR   Specific Gravity, Urine 1.010  1.005 - 1.030   pH 7.0  5.0 - 8.0   Glucose, UA NEGATIVE  NEGATIVE mg/dL   Hgb urine dipstick NEGATIVE  NEGATIVE   Bilirubin Urine NEGATIVE  NEGATIVE   Ketones, ur NEGATIVE  NEGATIVE mg/dL   Protein, ur NEGATIVE  NEGATIVE mg/dL   Urobilinogen, UA 0.2  0.0 - 1.0 mg/dL  Nitrite NEGATIVE  NEGATIVE   Leukocytes, UA NEGATIVE  NEGATIVE  LIPASE, BLOOD      Result Value Ref Range   Lipase 52  11 - 59 U/L   Dg Chest 2 View 03/24/2014   CLINICAL DATA:  Weakness and severe dizziness.  Nausea.  EXAM: CHEST  2 VIEW  COMPARISON:  None.  FINDINGS: The lungs are well-aerated and clear. There is no evidence of focal opacification, pleural effusion or pneumothorax.  The heart is normal in size; the mediastinal contour is within normal limits. No acute osseous abnormalities are seen.  IMPRESSION: No acute cardiopulmonary process seen.   Electronically Signed   By: Garald Balding M.D.   On: 03/24/2014 00:43    0055:  Pt's BP continues low 100's despite IVF. Unable to stand due to feeling "like I'm going to pass out." Denies vertigo symptoms. Denies CP/palpitations/SOB. Neuro exam continues intact. Pt has hx of AS. Dx and testing d/w pt and family.  Questions answered.  Verb understanding, agreeable to observation admit. T/C to [facility]. [place] Comment, case discussed, including:  HPI, pertinent PM/SHx, VS/PE, dx testing, ED course and treatment:  Agreeable to admit, requests to write temporary orders, obtain tele bed.   Francine Graven, DO 03/26/14 1724

## 2014-03-23 NOTE — ED Notes (Signed)
Pt c/o dizziness and vomited once today.

## 2014-03-24 ENCOUNTER — Encounter (HOSPITAL_COMMUNITY): Payer: Self-pay | Admitting: Internal Medicine

## 2014-03-24 DIAGNOSIS — R112 Nausea with vomiting, unspecified: Secondary | ICD-10-CM

## 2014-03-24 DIAGNOSIS — J039 Acute tonsillitis, unspecified: Secondary | ICD-10-CM

## 2014-03-24 DIAGNOSIS — I359 Nonrheumatic aortic valve disorder, unspecified: Secondary | ICD-10-CM

## 2014-03-24 DIAGNOSIS — R42 Dizziness and giddiness: Secondary | ICD-10-CM

## 2014-03-24 DIAGNOSIS — R55 Syncope and collapse: Secondary | ICD-10-CM | POA: Diagnosis present

## 2014-03-24 DIAGNOSIS — E860 Dehydration: Secondary | ICD-10-CM

## 2014-03-24 DIAGNOSIS — I9589 Other hypotension: Secondary | ICD-10-CM

## 2014-03-24 DIAGNOSIS — I959 Hypotension, unspecified: Secondary | ICD-10-CM | POA: Diagnosis present

## 2014-03-24 DIAGNOSIS — R011 Cardiac murmur, unspecified: Secondary | ICD-10-CM

## 2014-03-24 DIAGNOSIS — Q231 Congenital insufficiency of aortic valve: Secondary | ICD-10-CM

## 2014-03-24 LAB — TROPONIN I

## 2014-03-24 LAB — TSH: TSH: 0.293 u[IU]/mL — ABNORMAL LOW (ref 0.350–4.500)

## 2014-03-24 LAB — URINALYSIS, ROUTINE W REFLEX MICROSCOPIC
Bilirubin Urine: NEGATIVE
GLUCOSE, UA: NEGATIVE mg/dL
Hgb urine dipstick: NEGATIVE
Ketones, ur: NEGATIVE mg/dL
LEUKOCYTES UA: NEGATIVE
Nitrite: NEGATIVE
Protein, ur: NEGATIVE mg/dL
Specific Gravity, Urine: 1.01 (ref 1.005–1.030)
UROBILINOGEN UA: 0.2 mg/dL (ref 0.0–1.0)
pH: 7 (ref 5.0–8.0)

## 2014-03-24 MED ORDER — ONDANSETRON HCL 4 MG/2ML IJ SOLN
4.0000 mg | INTRAMUSCULAR | Status: DC | PRN
  Administered 2014-03-24: 4 mg via INTRAVENOUS
  Filled 2014-03-24: qty 2

## 2014-03-24 MED ORDER — ENOXAPARIN SODIUM 40 MG/0.4ML ~~LOC~~ SOLN
40.0000 mg | SUBCUTANEOUS | Status: DC
  Administered 2014-03-24: 40 mg via SUBCUTANEOUS
  Filled 2014-03-24: qty 0.4

## 2014-03-24 MED ORDER — ACETAMINOPHEN 325 MG PO TABS
650.0000 mg | ORAL_TABLET | Freq: Four times a day (QID) | ORAL | Status: DC | PRN
  Administered 2014-03-24: 650 mg via ORAL
  Filled 2014-03-24: qty 2

## 2014-03-24 MED ORDER — SODIUM CHLORIDE 0.9 % IV SOLN
INTRAVENOUS | Status: AC
  Administered 2014-03-24: 06:00:00 via INTRAVENOUS

## 2014-03-24 MED ORDER — ASPIRIN EC 81 MG PO TBEC
81.0000 mg | DELAYED_RELEASE_TABLET | Freq: Every day | ORAL | Status: DC
  Administered 2014-03-24: 81 mg via ORAL
  Filled 2014-03-24: qty 1

## 2014-03-24 MED ORDER — SODIUM CHLORIDE 0.9 % IV SOLN
INTRAVENOUS | Status: AC
  Administered 2014-03-24: 02:00:00 via INTRAVENOUS

## 2014-03-24 NOTE — Progress Notes (Signed)
Utilization Review Completed.Chonda Baney T9/17/2015  

## 2014-03-24 NOTE — H&P (Signed)
[facility] History and Physical  Kevin Barron YQI:[id number] DOB: [birth year]    PCP:   [name], PA-C   Chief Complaint:  Lightheadedness.  HPI: Kevin Barron is an 48 y.o. male with hx of known moderate AS (ECHO March 14, Mean gradient 49mmHg, Peak 25mmHg, valve area 0.91 by VTI, hx of HTN, not on any meds, presents to the ER with lightheadedness and near syncope.  He did not have any chest pain or SOB.  Denied taking diuretics or having diarrhea.  Last week, he had URI and was placed on Amoxicillin.  Evalaution in the ER included a clear CXR and unremarkable serology.  His BP was a little low in the ER at 100.  He felt better with IVF.  EKG showed NSR with no acute ST T changes.  Rewiew of Systems:  Constitutional: Negative for malaise, fever and chills. No significant weight loss or weight gain Eyes: Negative for eye pain, redness and discharge, diplopia, visual changes, or flashes of light. ENMT: Negative for ear pain, hoarseness, nasal congestion, sinus pressure and sore throat. No headaches; tinnitus, drooling, or problem swallowing. Cardiovascular: Negative for chest pain, palpitations, diaphoresis, dyspnea and peripheral edema. ; No orthopnea, PND Respiratory: Negative for cough, hemoptysis, wheezing and stridor. No pleuritic chestpain. Gastrointestinal: Negative for nausea, vomiting, diarrhea, constipation, abdominal pain, melena, blood in stool, hematemesis, jaundice and rectal bleeding.    Genitourinary: Negative for frequency, dysuria, incontinence,flank pain and hematuria; Musculoskeletal: Negative for back pain and neck pain. Negative for swelling and trauma.;  Skin: . Negative for pruritus, rash, abrasions, bruising and skin lesion.; ulcerations Neuro: Negative for headache,  and neck stiffness. Negative for weakness, altered level of consciousness , altered mental status, extremity weakness, burning feet, involuntary movement, seizure and  syncope.  Psych: negative for anxiety, depression, insomnia, tearfulness, panic attacks, hallucinations, paranoia, suicidal or homicidal ideation    Past Medical History  Diagnosis Date  . Murmur   . Obesity   . Hypertension     History reviewed. No pertinent past surgical history.  Medications:  HOME MEDS: Prior to Admission medications   Medication Sig Start Date End Date Taking? Authorizing Provider  Alum & Mag Hydroxide-Simeth (MAGIC MOUTHWASH W/LIDOCAINE) SOLN Take 10 mLs by mouth 4 (four) times daily as needed (throat pain). 03/13/14   Evalee Jefferson, PA-C  nitroGLYCERIN (NITROSTAT) 0.4 MG SL tablet Place 0.4 mg under the tongue every 5 (five) minutes as needed for chest pain.    Historical Provider, MD     Allergies:  No Known Allergies  Social History:   reports that he has never smoked. He does not have any smokeless tobacco history on file. He reports that he does not drink alcohol or use illicit drugs.  Family History: No family history on file.   Physical Exam: Filed Vitals:   03/23/14 2200 03/23/14 2230 03/23/14 2300 03/23/14 2330  BP: 107/78 101/73 107/76 109/72  Pulse: 81 79 82 80  Temp:      TempSrc:      Resp:      Weight:      SpO2: 98% 97% 96% 95%   Blood pressure 109/72, pulse 80, temperature 98.1 F (36.7 C), temperature source Oral, resp. rate 16, weight 83.008 kg (183 lb), SpO2 95.00%.  GEN:  Pleasant  patient lying in the stretcher in no acute distress; cooperative with exam. PSYCH:  alert and oriented x4; does not appear anxious or depressed; affect is appropriate. HEENT: Mucous membranes pink and anicteric; PERRLA; EOM  intact; no cervical lymphadenopathy nor thyromegaly or carotid bruit; no JVD; There were no stridor. Neck is very supple. Breasts:: Not examined CHEST WALL: No tenderness CHEST: Normal respiration, clear to auscultation bilaterally.  HEART: Regular rate and rhythm.  There is a mid cycle crescendo decrescendo murmur, with audible  S2. BACK: No kyphosis or scoliosis; no CVA tenderness ABDOMEN: soft and non-tender; no masses, no organomegaly, normal abdominal bowel sounds; no pannus; no intertriginous candida. There is no rebound and no distention. Rectal Exam: Not done EXTREMITIES: No bone or joint deformity; age-appropriate arthropathy of the hands and knees; no edema; no ulcerations.  There is no calf tenderness. Genitalia: not examined PULSES: 2+ and symmetric SKIN: Normal hydration no rash or ulceration CNS: Cranial nerves 2-12 grossly intact no focal lateralizing neurologic deficit.  Speech is fluent; uvula elevated with phonation, facial symmetry and tongue midline. DTR are normal bilaterally, cerebella exam is intact, barbinski is negative and strengths are equaled bilaterally.  No sensory loss.   Labs on Admission:  Basic Metabolic Panel:  Recent Labs Lab 03/23/14 1938  NA 138  K 4.4  CL 98  CO2 26  GLUCOSE 124*  BUN 17  CREATININE 0.81  CALCIUM 9.4   Liver Function Tests:  Recent Labs Lab 03/23/14 1938  AST 19  ALT 30  ALKPHOS 73  BILITOT 0.8  PROT 8.7*  ALBUMIN 4.1    Recent Labs Lab 03/23/14 2321  LIPASE 52   No results found for this basename: AMMONIA,  in the last 168 hours CBC:  Recent Labs Lab 03/23/14 1938  WBC 10.3  NEUTROABS 8.3*  HGB 15.1  HCT 43.4  MCV 90.0  PLT 500*   Cardiac Enzymes:  Recent Labs Lab 03/23/14 1938  TROPONINI <0.30    CBG: No results found for this basename: GLUCAP,  in the last 168 hours   Radiological Exams on Admission: Dg Chest 2 View  03/24/2014   CLINICAL DATA:  Weakness and severe dizziness.  Nausea.  EXAM: CHEST  2 VIEW  COMPARISON:  None.  FINDINGS: The lungs are well-aerated and clear. There is no evidence of focal opacification, pleural effusion or pneumothorax.  The heart is normal in size; the mediastinal contour is within normal limits. No acute osseous abnormalities are seen.  IMPRESSION: No acute cardiopulmonary process  seen.   Electronically Signed   By: Garald Balding M.D.   On: 03/24/2014 00:43    Assessment/Plan Present on Admission:  . Near syncope . Aortic stenosis . Hypotension  PLAN:  Will admit him for IVF, r/out.  I am concerned about the moderate aortic stenosis causing the lightheadedness.  WIll repeat ECHO and consult cardiology.  Will give him an ASA 81mg  per day.  He had no LVH or CHF with his last ECHO.  Will avoid hypotension.  He is stable, full code, and will be admitted to hospitalist service. Thank you for allowing me to participate in his care.  Other plans as per orders.  Code Status: FULL Haskel Khan, MD. [facility] Pager [phone] 7pm to 7am.  03/24/2014, 1:35 AM

## 2014-03-24 NOTE — Progress Notes (Signed)
  Echocardiogram 2D Echocardiogram has been performed.  Kevin Barron, [place] 03/24/2014, 12:24 PM

## 2014-03-24 NOTE — Consult Note (Signed)
CARDIOLOGY CONSULT NOTE   Patient ID: Kevin Barron MRN: [id number] DOB/AGE: 50/31/1967 50 y.o.  Admit Date: 03/23/2014 Referring Physician: PTH Primary Physician: Kevin Mau, PA-C Consulting Cardiologist: Kevin Sable MD Primary Cardiologist Kevin Rouge MD Reason for Consultation: Syncope  Clinical Summary Kevin Barron is a non-English speaking Hispanic  48 y.o.male with known history of bicuspid AoV, moderate AoV stenosis, hypertension who presented to the ER with complaints of near syncope, staggering, weakness and diaphoresis. Hx is obtained via family memeber interpretor at the bedside.   He was recently treated for tonsillitis, group A strep via ER on 03/14/2014 with antibiotics, amoxicillin 500 mg TID for one week. Since recovering from the infection, he continued to feel weak. Last two days prior to admission he had worsening fatigue with periods of near syncope prompting ER evaluation. He denies chest pain,DOE, NVD, but has had episodes of racing heart rate,  excessive diaphoresis, and dizziness with headache. He states that in the past, he felt dizzy with exertion, i.e. Lifting heavy objects with heart racing. Now this occurs at rest.   We are asked to evaluate bicuspid AoV as etiology for his symptoms. He is not on any Rx for hypertension, bur review of past office visit with Kevin Barron on 05/24/2013, he was listed as taking metoprolol 25 mg  BID.  In ER, BP 143/83. HR 84, afebrile. Labs essentially unremarkable, with the exception of elevated platelets at 500, no leukocytosis. Troponin negative. CXR was without abnormalities, no mention of aortic root enlargement. EKG NSR without evidence of ACS   He is being treated with IV fluids. He continues to have headache and dizziness on exam.   No Known Allergies  Medications Scheduled Medications: . aspirin EC  81 mg Oral Daily  . enoxaparin (LOVENOX) injection  40 mg Subcutaneous Q24H       PRN Medications: acetaminophen, ondansetron (ZOFRAN) IV   Past Medical History  Diagnosis Date  . Murmur   . Obesity   . Hypertension     History reviewed. No pertinent past surgical history.  Family History  Problem Relation Age of Onset  . Diabetes Mother   . Diabetes Father     Social History Kevin Barron reports that he has never smoked. He does not have any smokeless tobacco history on file. Kevin Barron reports that he does not drink alcohol.  Review of Systems Otherwise reviewed and negative except as outlined.  Physical Examination Blood pressure 121/61, pulse 84, temperature 98.4 F (36.9 C), temperature source Oral, resp. rate 20, height 5\' 7"  (1.702 m), weight 182 lb 1.6 oz (82.6 kg), SpO2 98.00%.   GEN:Ill appearing  HEENT: Conjunctiva and lids normal, oropharynx clear with moist mucosa. Neck: Supple, no elevated JVP or carotid bruits, no thyromegaly. Lungs: Clear to auscultation, nonlabored breathing at rest. Cardiac: Regular rate and rhythm,holosystolic murmur, intermittent S2, radiating into the abdomen, , no pericardial rub. Abdomen: Soft, nontender, no hepatomegaly, bowel sounds present, no guarding or rebound. Extremities: No pitting edema, distal pulses 2+. Skin: Warm and dry. Musculoskeletal: No kyphosis. Neuropsychiatric: Alert and oriented x3, affect grossly appropriate.  Prior Cardiac Testing/Procedures  1. NM Stress Test 05/26/2014 Low risk exercise Cardiolite. No diagnostic ST segment changes were  seen at a maximum work load of 13.4 METS, no chest pain reported.  Perfusion imaging is most consistent with soft tissue attenuation  affecting the mid to basal inferolateral wall, no ischemia. LVEF is  66% with normal volumes and no wall motion abnormalities.  2.Echo 10/28/2012 Left ventricle: The cavity size was normal. Wall thickness was at the upper limits of normal. Systolic function was normal. The estimated ejection  fraction was in the range of 60% to 65%. Wall motion was normal; there were no regional wall motion abnormalities. Features are consistent with a pseudonormal left ventricular filling pattern, with concomitant abnormal relaxation and increased filling pressure (grade 2 diastolic dysfunction). - Aortic valve: Moderately calcified annulus. Bicuspid. There was moderate stenosis. Trivial regurgitation. Mean gradient: 85mm Hg (S). Peak gradient: 53mm Hg (S). Valve area: 0.91cm^2(VTI). Valve area 1.3 cm2 by planimetry - difficult to visualize. The LVOT/AV VTI ratio is 0.30. No prior study for comparison. - Mitral valve: Calcified annulus. Trivial regurgitation. - Left atrium: The atrium was at the upper limits of normal in size. - Right atrium: The atrium was mildly dilated. Central venous pressure: 36mm Hg (est). - Tricuspid valve: Trivial regurgitation. - Pulmonary arteries: PA peak pressure: 60mm Hg (S). - Pericardium, extracardiac: There was no pericardial effusion.   Lab Results  Basic Metabolic Panel:  Recent Labs Lab 03/23/14 1938  NA 138  K 4.4  CL 98  CO2 26  GLUCOSE 124*  BUN 17  CREATININE 0.81  CALCIUM 9.4    Liver Function Tests:  Recent Labs Lab 03/23/14 1938  AST 19  ALT 30  ALKPHOS 73  BILITOT 0.8  PROT 8.7*  ALBUMIN 4.1    CBC:  Recent Labs Lab 03/23/14 1938  WBC 10.3  NEUTROABS 8.3*  HGB 15.1  HCT 43.4  MCV 90.0  PLT 500*    Cardiac Enzymes:  Recent Labs Lab 03/23/14 1938 03/24/14 0221 03/24/14 0804  TROPONINI <0.30 <0.30 <0.30   Radiology: Dg Chest 2 View  03/24/2014   CLINICAL DATA:  Weakness and severe dizziness.  Nausea.  EXAM: CHEST  2 VIEW  COMPARISON:  None.  FINDINGS: The lungs are well-aerated and clear. There is no evidence of focal opacification, pleural effusion or pneumothorax.  The heart is normal in size; the mediastinal contour is within normal limits. No acute osseous abnormalities are seen.  IMPRESSION: No  acute cardiopulmonary process seen.   Electronically Signed   By: Kevin Barron M.D.   On: 03/24/2014 00:43     ECG: NSR with incomplete RBBB.    Impression and Recommendations  1.AoV stenosis with Bicuspid AoV: This was noted on echo in April of 2014. He has not been seen by cardiology since that time. He denies chest pain or DOE, but has overall sense of weakness and dizziness with near syncope. Agree with repeating echocardiogram for re-evaluation of AoV. I will start him back on low dose metoprolol 12.5 mg BID, as he was not found to be orthostatic or bradycardic.  Concerns that dizziness in related to worsening stenosis. More recommendation per Dr. Bronson Ing after review of echo.  2.  Hypertension: Currently well controlled ranging between 140's and 120's. He is not orthostatic on recorded vital signs.        Signed: Phill Myron. [place] NP  03/24/2014, 10:16 AM Co-Sign MD

## 2014-03-24 NOTE — Consult Note (Signed)
The patient was seen and examined, and I agree with the assessment and plan as documented above, with modifications as noted below. Pt admitted with dizziness, vomiting and near syncope. Recently diagnosed with tonsillitis with amoxicillin. BP noted to be low normal (101/73) in ED, and with symptom improvement after IV fluid repletion. Denies exertional chest pain, shortness of breath and dizziness in past year. Has h/o bicuspid aortic valve with moderate stenosis reported in 10/2012. Normal nuclear MPI study in 05/2013. While S2 is somewhat diminished, it is appreciated. I suspect his near syncopal episode was secondary to low normal BP and dehydration from secondary fluid losses due to infection (platelets and neutrophils elevated, the former reflective of a reactive thrombocytosis), in the context of AV stenosis. Currently hemodynamically stable. Will await repeat echocardiogram for further recommendations.

## 2014-03-24 NOTE — Progress Notes (Signed)
  PROGRESS NOTE  Kevin Barron VZC:[id number] DOB: 1966-02-28 DOA: 03/23/2014 PCP: [name], PA-C Primary Cardiologist Jenkins Rouge MD  Summary: 48 year old man with history of moderate aortic stenosis presented with lightheadedness and near syncope.   Assessment/Plan: 1. Near syncope. Thought to be secondary to a low normal blood pressure, dehydration. No orthostasis. Vertigo has resolved. No focal deficits. Consider BPPV based on history of vertigo.  2. Bicuspid aortic valve, moderate aortic stenosis. No significant change echocardiogram. Discussed with cardiology, no further evaluation at this time. Dr. [name] recommends not continuing metoprolol and can discharge home. 3. Recent treatment for tonsillitis.   Discharge home today.  Followup mildly depressed TSH with PCP.  I discussed results of testing, cardiology recommendations, further possible thyroid testing with the patient and his wife via Spanish interpreter by telephone.  Interview time 54 minutes  [name], MD  [facility]  Pager [phone] If 7PM-7AM, please contact night-coverage at [email or web address], password [facility] 03/24/2014, 3:09 PM  LOS: 1 day   Consultants:  Cardiology  Procedures:  2-D echocardiogram: LVEF 60-65%. Normal wall motion. Indeterminate diastolic function. Moderate aortic stenosis.  Antibiotics:    HPI/Subjective: History, examination, assessment and plan obtained and discussed via Spanish interpreter via telephone, wife present.  Patient feeling much better today. He has mild residual lightheadedness but vertigo he had yesterday has resolved. He has no focal weakness and no other complaints.   Objective: Filed Vitals:   03/23/14 2300 03/23/14 2330 03/24/14 0211 03/24/14 1502  BP: 107/76 109/72 121/61 110/70  Pulse: 82 80 84 80  Temp:   98.4 F (36.9 C) 98.5 F (36.9 C)  TempSrc:   Oral Oral  Resp:   20 20  Height:   5\' 7"  (1.702 m)   Weight:   82.6  kg (182 lb 1.6 oz)   SpO2: 96% 95% 98% 95%    Intake/Output Summary (Last 24 hours) at 03/24/14 1509 Last data filed at 03/24/14 1200  Gross per 24 hour  Intake   1110 ml  Output    375 ml  Net    735 ml     Filed Weights   03/23/14 1906 03/24/14 0211  Weight: 83.008 kg (183 lb) 82.6 kg (182 lb 1.6 oz)    Exam:     Afebrile, vital signs are stable. No hypoxia. Orthostatics were negative. Gen. Appears calm, comfortable.  Psych. Alert.  Cardiovascular. Regular rate and rhythm. 3/6 systolic murmur. No rub or gallop.  Respiratory. Clear to auscultation bilaterally. No wheezes, rales or rhonchi. Normal respiratory effort.  Musculoskeletal. Moves all extremities well. Ambulates without difficulty independently with normal gait, turns without difficulty. No focal deficits noted  Neurologic. Grossly unremarkable.  Data Reviewed:  Complete metabolic panel unremarkable on admission, lipase normal. Troponins negative. Mild thrombocytosis seen.  TSH slightly low.  Urinalysis negative.  Chest x-ray no acute disease.  Scheduled Meds: . aspirin EC  81 mg Oral Daily  . enoxaparin (LOVENOX) injection  40 mg Subcutaneous Q24H   Continuous Infusions:   Active Problems:   Aortic stenosis   Near syncope   Hypotension

## 2014-03-24 NOTE — Discharge Summary (Signed)
Physician Discharge Summary  Kevin Barron PYP:[id number] DOB: Oct 23, 1965 DOA: 03/23/2014  PCP: Kevin Mau, PA-C  Admit date: 03/23/2014 Discharge date: 03/24/2014  Recommendations for Outpatient Follow-up:  1. Near syncope, see discussion below 2. Mildly depressed TSH, consider T3, T4 testing as an outpatient.    Follow-up Information   Follow up with Kevin Mau, PA-C. Schedule an appointment as soon as possible for a visit in 1 week.   Specialty:  Physician Assistant   Contact information:   [facility] [place] [address] [phone]      Discharge Diagnoses:  1. Near syncope 2. Bicuspid aortic valve with moderate aortic stenosis 3. Mildly depressed TSH  Discharge Condition: improved Disposition: home  Diet recommendation: regular  Filed Weights   03/23/14 1906 03/24/14 0211  Weight: 83.008 kg (183 lb) 82.6 kg (182 lb 1.6 oz)    History of present illness:  48 year old man with history of moderate aortic stenosis presented with vertigo, lightheadedness and near syncope.   Hospital Course:  Patient was observed overnight. Telemetry was unremarkable, and troponins were negative, EKG nonacute. Repeat echocardiogram without significant change. In discussion with cardiology no further evaluation was recommended, no medications on discharge. In discussion with the patient he had an episode yesterday of feeling weak, lightheaded and had significant vertigo. Today the vertigo has resolved he is ambulating without difficulty. No focal neurologic deficits.   1. Near syncope. Thought to be secondary to a low normal blood pressure, dehydration. No orthostasis. Vertigo has resolved. No focal deficits. Consider BPPV based on history of vertigo.  2. Bicuspid aortic valve, moderate aortic stenosis. No significant change echocardiogram. Discussed with cardiology, no further evaluation at this time. Dr. [name]  recommends not continuing metoprolol and can discharge home. 3. Followup mildly depressed TSH with PCP. He is now clear her full symptoms to suggest hyperthyroidism. I discussed results of testing, cardiology recommendations, further possible thyroid testing with the patient and his wife via Spanish interpreter by telephone.   Discharge Instructions  Discharge Instructions   Diet general    Complete by:  As directed      Discharge instructions    Complete by:  As directed   Call your physician or seek immediate medical attention for dizziness, passing out, chest pain, shortness of breath or worsening of condition.     Increase activity slowly    Complete by:  As directed           Current Discharge Medication List    STOP taking these medications     Alum & Mag Hydroxide-Simeth (MAGIC MOUTHWASH W/LIDOCAINE) SOLN      amoxicillin (AMOXIL) 500 MG capsule      nitroGLYCERIN (NITROSTAT) 0.4 MG SL tablet        No Known Allergies  The results of significant diagnostics from this hospitalization (including imaging, microbiology, ancillary and laboratory) are listed below for reference.    Significant Diagnostic Studies: Dg Chest 2 View  03/24/2014   CLINICAL DATA:  Weakness and severe dizziness.  Nausea.  EXAM: CHEST  2 VIEW  COMPARISON:  None.  FINDINGS: The lungs are well-aerated and clear. There is no evidence of focal opacification, pleural effusion or pneumothorax.  The heart is normal in size; the mediastinal contour is within normal limits. No acute osseous abnormalities are seen.  IMPRESSION: No acute cardiopulmonary process seen.   Electronically Signed   By: Garald Balding M.D.   On: 03/24/2014 00:43  Labs: Basic Metabolic Panel:  Recent Labs Lab 03/23/14 1938  NA 138  K 4.4  CL 98  CO2 26  GLUCOSE 124*  BUN 17  CREATININE 0.81  CALCIUM 9.4   Liver Function Tests:  Recent Labs Lab 03/23/14 1938  AST 19  ALT 30  ALKPHOS 73  BILITOT 0.8  PROT 8.7*    ALBUMIN 4.1    Recent Labs Lab 03/23/14 2321  LIPASE 52   CBC:  Recent Labs Lab 03/23/14 1938  WBC 10.3  NEUTROABS 8.3*  HGB 15.1  HCT 43.4  MCV 90.0  PLT 500*   Cardiac Enzymes:  Recent Labs Lab 03/23/14 1938 03/24/14 0221 03/24/14 0804 03/24/14 1413  TROPONINI <0.30 <0.30 <0.30 <0.30     Active Problems:   Aortic stenosis   Near syncope   Hypotension   Time coordinating discharge: 60 minutes  Signed:  [name], MD [facility] 03/24/2014, 6:56 PM

## 2014-03-25 LAB — URINE CULTURE
Colony Count: NO GROWTH
Culture: NO GROWTH

## 2016-12-09 ENCOUNTER — Ambulatory Visit: Payer: Self-pay | Admitting: Physician Assistant

## 2016-12-17 NOTE — Congregational Nurse Program (Signed)
Congregational Nurse Program Note  Date of Encounter: 12/17/2016  Past Medical History: Past Medical History:  Diagnosis Date  . Hypertension   . Murmur   . Obesity     Encounter Details:     CNP Questionnaire - 12/17/16 0900      Patient Demographics   Is this a new or existing patient? New   Patient is considered a/an Immigrant   Race Latino/Hispanic     Patient Assistance   Location of Patient Assistance [employer],    Patient's financial/insurance status Low Income;Self-Pay (Uninsured)   Uninsured Patient ([place]) Yes   Interventions Counseled to make appt. with provider;Assisted patient in making appt.   Patient referred to apply for the following financial assistance Not Applicable   Food insecurities addressed Not Applicable   Transportation assistance No   Assistance securing medications No   Educational health offerings Navigating the healthcare system;Other     Encounter Details   Primary purpose of visit Education/Health Concerns;Navigating the Healthcare System   Was an Emergency Department visit averted? Not Applicable   Does patient have a medical provider? No   Patient referred to Establish PCP;Clinic   Was a mental health screening completed? (GAINS tool) No   Does patient have dental issues? No   Does patient have vision issues? No   Does your patient have an abnormal blood pressure today? No   Since previous encounter, have you referred patient for abnormal blood pressure that resulted in a new diagnosis or medication change? No   Does your patient have an abnormal blood glucose today? No   Since previous encounter, have you referred patient for abnormal blood glucose that resulted in a new diagnosis or medication change? No   Was there a life-saving intervention made? No     Client that had previously been screened and referred into the free clinic > 2 years ago, but client has not been to the [facility] in over 2  years. 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 He has no health insurance.  No Known Drug allergies  Medications : reports he is not taking any at this time including OTC's.  Past surgeries: left leg surgery years ago due to an accident.  Alert and oriented , interview assisted by certified Medical interpreter Demetrius Revel. Client is Spanish speaking. He does not appear anxious or nervous.  His chief concern today is white itchy patches of rash that has appeared on the right cheek area of his face and on his forehead. He reports that he has had this now for a couple weeks and is concerned. He reports he has not changed any shaving creams and that he changes his razor regularly.  Discussed with client that referral would be for primary care and continued preventative care. Client states understanding and verbalizes he would like to be referred back into the [facility]. Referral made and appointment secured for 12/23/16 at 11:00 am . Appointment and contact information given to client.  Will follow as needed.

## 2016-12-23 ENCOUNTER — Encounter: Payer: Self-pay | Admitting: Physician Assistant

## 2016-12-23 ENCOUNTER — Ambulatory Visit: Payer: Self-pay | Admitting: Physician Assistant

## 2016-12-23 VITALS — BP 130/80 | HR 86 | Temp 97.3°F | Ht 63.25 in | Wt 203.5 lb

## 2016-12-23 DIAGNOSIS — E785 Hyperlipidemia, unspecified: Secondary | ICD-10-CM

## 2016-12-23 DIAGNOSIS — D75839 Thrombocytosis, unspecified: Secondary | ICD-10-CM

## 2016-12-23 DIAGNOSIS — R7989 Other specified abnormal findings of blood chemistry: Secondary | ICD-10-CM

## 2016-12-23 DIAGNOSIS — Z131 Encounter for screening for diabetes mellitus: Secondary | ICD-10-CM

## 2016-12-23 DIAGNOSIS — L989 Disorder of the skin and subcutaneous tissue, unspecified: Secondary | ICD-10-CM

## 2016-12-23 DIAGNOSIS — D473 Essential (hemorrhagic) thrombocythemia: Secondary | ICD-10-CM

## 2016-12-23 DIAGNOSIS — Q231 Congenital insufficiency of aortic valve: Secondary | ICD-10-CM

## 2016-12-23 DIAGNOSIS — E669 Obesity, unspecified: Secondary | ICD-10-CM

## 2016-12-23 DIAGNOSIS — I350 Nonrheumatic aortic (valve) stenosis: Secondary | ICD-10-CM

## 2016-12-23 NOTE — Progress Notes (Signed)
BP 130/80 (BP Location: Left Arm, Patient Position: Sitting, Cuff Size: Normal)   Pulse 86   Temp 97.3 F (36.3 C)   Ht 5' 3.25" (1.607 m)   Wt 203 lb 8 oz (92.3 kg)   SpO2 96%   BMI 35.76 kg/m    Subjective:    Patient ID: Ferrel Simington, male    DOB: Nov 19, 1965, 51 y.o.   MRN: [id number]  HPI: Carel [place] is a 51 y.o. male presenting on 12/23/2016 for New Patient (Initial Visit) (Previous patient of [facility] pt states does not recall seeing any other PCP after he stopped coming here)   HPI   Chief Complaint  Patient presents with  . New Patient (Initial Visit)    Previous patient of [facility] pt states does not recall seeing any other PCP after he stopped coming here     Last seen here 03/18/2013 . He was referred to cards and seen due to CP.  Had nl ETT 10/30/12.  Normal cardiolite 05/26/13.  He did not f/u with cards in 6 mo per their rec.   Pt was admitted 2015 for CP and had echo at that time.  Leaflets not reported.    Pt says he came back to office for a place on his face that he is worried about. He says it has been there for a month.  He says he hasn't been picking at it.  He says it sometimes itches a little bit.   Relevant past medical, surgical, family and social history reviewed and updated as indicated. Interim medical history since our last visit reviewed. Allergies and medications reviewed and updated.  No current outpatient prescriptions on file.   Review of Systems  Constitutional: Negative for appetite change, chills, diaphoresis, fatigue, fever and unexpected weight change.  HENT: Negative for congestion, dental problem, drooling, ear pain, facial swelling, hearing loss, mouth sores, sneezing, sore throat, trouble swallowing and voice change.   Eyes: Negative for pain, discharge, redness, itching and visual disturbance.  Respiratory: Negative for cough, choking, shortness of breath and wheezing.   Cardiovascular: Negative for chest pain,  palpitations and leg swelling.  Gastrointestinal: Negative for abdominal pain, blood in stool, constipation, diarrhea and vomiting.  Endocrine: Negative for cold intolerance, heat intolerance and polydipsia.  Genitourinary: Negative for decreased urine volume, dysuria and hematuria.  Musculoskeletal: Negative for arthralgias, back pain and gait problem.  Skin: Negative for rash.  Allergic/Immunologic: Negative for environmental allergies.  Neurological: Negative for seizures, syncope, light-headedness and headaches.  Hematological: Negative for adenopathy.  Psychiatric/Behavioral: Negative for agitation, dysphoric mood and suicidal ideas. The patient is not nervous/anxious.     Per HPI unless specifically indicated above     Objective:    BP 130/80 (BP Location: Left Arm, Patient Position: Sitting, Cuff Size: Normal)   Pulse 86   Temp 97.3 F (36.3 C)   Ht 5' 3.25" (1.607 m)   Wt 203 lb 8 oz (92.3 kg)   SpO2 96%   BMI 35.76 kg/m   Wt Readings from Last 3 Encounters:  12/23/16 203 lb 8 oz (92.3 kg)  03/24/14 182 lb 1.6 oz (82.6 kg)  03/12/14 185 lb (83.9 kg)    Physical Exam  Constitutional: He is oriented to person, place, and time. He appears well-developed and well-nourished.  HENT:  Head: Normocephalic and atraumatic.  Mouth/Throat: Oropharynx is clear and moist. No oropharyngeal exudate.  Eyes: Conjunctivae and EOM are normal. Pupils are equal, round, and reactive to light.  Neck: Neck  supple. No thyromegaly present.  Cardiovascular: Normal rate and regular rhythm.   Pulmonary/Chest: Effort normal and breath sounds normal. He has no wheezes. He has no rales.  Abdominal: Soft. Bowel sounds are normal. He exhibits no mass. There is no hepatosplenomegaly. There is no tenderness.  Musculoskeletal: He exhibits no edema.  Lymphadenopathy:    He has no cervical adenopathy.  Neurological: He is alert and oriented to person, place, and time.  Skin: Skin is warm and dry. No  rash noted.  Psychiatric: He has a normal mood and affect. His behavior is normal. Thought content normal.  Vitals reviewed.        Assessment & Plan:    Encounter Diagnoses  Name Primary?  Marland Kitchen Aortic valve stenosis, etiology of cardiac valve disease unspecified Yes  . Obesity, unspecified classification, unspecified obesity type, unspecified whether serious comorbidity present   . Abnormal thyroid blood test   . Thrombocytosis ([place])   . Abnormality, skin   . Hyperlipidemia, unspecified hyperlipidemia type   . Screening for diabetes mellitus     -pt to get Fasting labs tomorrow morning -pt is given [facility] discount application -will Refer pt back to cardiology for monitoring of bicuspid AV and aortic stenosis -will Refer to dermatology for opinion on facial lesions.  Discussed with pt they appear to be due to sun damage but will get specialist input -pt to follow up in 1 month. RTO sooner prn

## 2016-12-26 ENCOUNTER — Other Ambulatory Visit (HOSPITAL_COMMUNITY)
Admission: RE | Admit: 2016-12-26 | Discharge: 2016-12-26 | Disposition: A | Discharge status: Home / Self Care | Payer: MEDICAID | Source: Ambulatory Visit | Attending: Physician Assistant | Admitting: Physician Assistant

## 2016-12-26 LAB — COMPREHENSIVE METABOLIC PANEL
ALK PHOS: 47 U/L (ref 38–126)
ALT: 32 U/L (ref 17–63)
AST: 24 U/L (ref 15–41)
Albumin: 4 g/dL (ref 3.5–5.0)
Anion gap: 8 (ref 5–15)
BUN: 14 mg/dL (ref 6–20)
CALCIUM: 8.6 mg/dL — AB (ref 8.9–10.3)
CO2: 27 mmol/L (ref 22–32)
CREATININE: 0.78 mg/dL (ref 0.61–1.24)
Chloride: 102 mmol/L (ref 101–111)
GFR calc Af Amer: >60 mL/min (ref >60)
Glucose, Bld: 110 mg/dL — ABNORMAL HIGH (ref 65–99)
POTASSIUM: 3.7 mmol/L (ref 3.5–5.1)
Sodium: 137 mmol/L (ref 135–145)
TOTAL PROTEIN: 7.1 g/dL (ref 6.5–8.1)
Total Bilirubin: 0.5 mg/dL (ref 0.3–1.2)

## 2016-12-26 LAB — CBC WITH DIFFERENTIAL/PLATELET
BASOS ABS: 0 10*3/uL (ref 0.0–0.1)
Basophils Relative: 0 %
EOS ABS: 0.2 10*3/uL (ref 0.0–0.7)
EOS PCT: 3 %
HCT: 42.6 % (ref 39.0–52.0)
Hemoglobin: 14.7 g/dL (ref 13.0–17.0)
LYMPHS PCT: 27 %
Lymphs Abs: 1.9 10*3/uL (ref 0.7–4.0)
MCH: 31.2 pg (ref 26.0–34.0)
MCHC: 34.5 g/dL (ref 30.0–36.0)
MCV: 90.4 fL (ref 78.0–100.0)
MONO ABS: 0.6 10*3/uL (ref 0.1–1.0)
Monocytes Relative: 9 %
Neutro Abs: 4.3 10*3/uL (ref 1.7–7.7)
Neutrophils Relative %: 61 %
PLATELETS: 294 10*3/uL (ref 150–400)
RBC: 4.71 MIL/uL (ref 4.22–5.81)
RDW: 13.2 % (ref 11.5–15.5)
WBC: 7.1 10*3/uL (ref 4.0–10.5)

## 2016-12-26 LAB — LIPID PANEL
CHOLESTEROL: 226 mg/dL — AB (ref 0–200)
HDL: 35 mg/dL — ABNORMAL LOW (ref >40)
LDL Cholesterol: 166 mg/dL — ABNORMAL HIGH (ref 0–99)
Total CHOL/HDL Ratio: 6.5 RATIO
Triglycerides: 127 mg/dL (ref <150)
VLDL: 25 mg/dL (ref 0–40)

## 2016-12-26 LAB — TSH: TSH: 1.998 u[IU]/mL (ref 0.350–4.500)

## 2016-12-27 LAB — HEMOGLOBIN A1C
Hgb A1c MFr Bld: 6.4 % — ABNORMAL HIGH (ref 4.8–5.6)
MEAN PLASMA GLUCOSE: 137 mg/dL

## 2017-01-21 ENCOUNTER — Ambulatory Visit: Payer: Self-pay | Admitting: Physician Assistant

## 2017-02-04 ENCOUNTER — Encounter: Payer: Self-pay | Admitting: Physician Assistant

## 2017-03-31 ENCOUNTER — Encounter: Payer: Self-pay | Admitting: Physician Assistant

## 2017-03-31 ENCOUNTER — Ambulatory Visit: Payer: Self-pay | Admitting: Physician Assistant

## 2017-03-31 VITALS — BP 126/74 | HR 70 | Temp 97.7°F | Ht 63.25 in | Wt 211.8 lb

## 2017-03-31 DIAGNOSIS — E669 Obesity, unspecified: Secondary | ICD-10-CM

## 2017-03-31 DIAGNOSIS — I350 Nonrheumatic aortic (valve) stenosis: Secondary | ICD-10-CM

## 2017-03-31 DIAGNOSIS — E785 Hyperlipidemia, unspecified: Secondary | ICD-10-CM

## 2017-03-31 DIAGNOSIS — R7303 Prediabetes: Secondary | ICD-10-CM

## 2017-03-31 MED ORDER — SIMVASTATIN 20 MG PO TABS: ORAL_TABLET | ORAL | 4 refills | Status: DC

## 2017-03-31 NOTE — Progress Notes (Signed)
BP 126/74 (BP Location: Left Arm, Patient Position: Sitting, Cuff Size: Normal)   Pulse 70   Temp 97.7 F (36.5 C)   Ht 5' 3.25" (1.607 m)   Wt 211 lb 12 oz (96 kg)   SpO2 97%   BMI 37.21 kg/m    Subjective:    Patient ID: Kevin Barron, male    DOB: January 23, 1966, 51 y.o.   MRN: [id number]  HPI: Kevin Barron is a 51 y.o. male presenting on 03/31/2017 for Follow-up (to review labs)   HPI  Pt says he no longer wants to see dermatologist.  He says the place on his face is no longer bothering him.   Pt says he feels good today  Relevant past medical, surgical, family and social history reviewed and updated as indicated. Interim medical history since our last visit reviewed. Allergies and medications reviewed and updated.  No current outpatient prescriptions on file.   Review of Systems  Constitutional: Negative for appetite change, chills, diaphoresis, fatigue, fever and unexpected weight change.  HENT: Negative for congestion, dental problem, drooling, ear pain, facial swelling, hearing loss, mouth sores, sneezing, sore throat, trouble swallowing and voice change.   Eyes: Negative for pain, discharge, redness, itching and visual disturbance.  Respiratory: Negative for cough, choking, shortness of breath and wheezing.   Cardiovascular: Negative for chest pain, palpitations and leg swelling.  Gastrointestinal: Negative for abdominal pain, blood in stool, constipation, diarrhea and vomiting.  Endocrine: Negative for cold intolerance, heat intolerance and polydipsia.  Genitourinary: Negative for decreased urine volume, dysuria and hematuria.  Musculoskeletal: Negative for arthralgias, back pain and gait problem.  Skin: Negative for rash.  Allergic/Immunologic: Negative for environmental allergies.  Neurological: Negative for seizures, syncope, light-headedness and headaches.  Hematological: Negative for adenopathy.  Psychiatric/Behavioral: Negative for  agitation, dysphoric mood and suicidal ideas. The patient is not nervous/anxious.     Per HPI unless specifically indicated above     Objective:    BP 126/74 (BP Location: Left Arm, Patient Position: Sitting, Cuff Size: Normal)   Pulse 70   Temp 97.7 F (36.5 C)   Ht 5' 3.25" (1.607 m)   Wt 211 lb 12 oz (96 kg)   SpO2 97%   BMI 37.21 kg/m   Wt Readings from Last 3 Encounters:  03/31/17 211 lb 12 oz (96 kg)  12/23/16 203 lb 8 oz (92.3 kg)  03/24/14 182 lb 1.6 oz (82.6 kg)    Physical Exam  Constitutional: He is oriented to person, place, and time. He appears well-developed and well-nourished.  HENT:  Head: Normocephalic and atraumatic.  Neck: Neck supple.  Cardiovascular: Normal rate and regular rhythm.   Murmur heard. Pulmonary/Chest: Effort normal and breath sounds normal. He has no wheezes.  Abdominal: Soft. Bowel sounds are normal. There is no hepatosplenomegaly. There is no tenderness.  Musculoskeletal: He exhibits no edema.  Lymphadenopathy:    He has no cervical adenopathy.  Neurological: He is alert and oriented to person, place, and time.  Skin: Skin is warm and dry.  Psychiatric: He has a normal mood and affect. His behavior is normal.  Vitals reviewed.   Results for orders placed or performed during the hospital encounter of 12/26/16  Hemoglobin A1c  Result Value Ref Range   Hgb A1c MFr Bld 6.4 (H) 4.8 - 5.6 %   Mean Plasma Glucose 137 mg/dL  TSH  Result Value Ref Range   TSH 1.998 0.350 - 4.500 uIU/mL  Comprehensive metabolic panel  Result Value Ref  Range   Sodium 137 135 - 145 mmol/L   Potassium 3.7 3.5 - 5.1 mmol/L   Chloride 102 101 - 111 mmol/L   CO2 27 22 - 32 mmol/L   Glucose, Bld 110 (H) 65 - 99 mg/dL   BUN 14 6 - 20 mg/dL   Creatinine, Ser 0.78 0.61 - 1.24 mg/dL   Calcium 8.6 (L) 8.9 - 10.3 mg/dL   Total Protein 7.1 6.5 - 8.1 g/dL   Albumin 4.0 3.5 - 5.0 g/dL   AST 24 15 - 41 U/L   ALT 32 17 - 63 U/L   Alkaline Phosphatase 47 38 - 126  U/L   Total Bilirubin 0.5 0.3 - 1.2 mg/dL   GFR calc non Af Amer >60 >60 mL/min   GFR calc Af Amer >60 >60 mL/min   Anion gap 8 5 - 15  CBC with Differential/Platelet  Result Value Ref Range   WBC 7.1 4.0 - 10.5 K/uL   RBC 4.71 4.22 - 5.81 MIL/uL   Hemoglobin 14.7 13.0 - 17.0 g/dL   HCT 42.6 39.0 - 52.0 %   MCV 90.4 78.0 - 100.0 fL   MCH 31.2 26.0 - 34.0 pg   MCHC 34.5 30.0 - 36.0 g/dL   RDW 13.2 11.5 - 15.5 %   Platelets 294 150 - 400 K/uL   Neutrophils Relative % 61 %   Neutro Abs 4.3 1.7 - 7.7 K/uL   Lymphocytes Relative 27 %   Lymphs Abs 1.9 0.7 - 4.0 K/uL   Monocytes Relative 9 %   Monocytes Absolute 0.6 0.1 - 1.0 K/uL   Eosinophils Relative 3 %   Eosinophils Absolute 0.2 0.0 - 0.7 K/uL   Basophils Relative 0 %   Basophils Absolute 0.0 0.0 - 0.1 K/uL  Lipid panel  Result Value Ref Range   Cholesterol 226 (H) 0 - 200 mg/dL   Triglycerides 127 <150 mg/dL   HDL 35 (L) >40 mg/dL   Total CHOL/HDL Ratio 6.5 RATIO   VLDL 25 0 - 40 mg/dL   LDL Cholesterol 166 (H) 0 - 99 mg/dL      Assessment & Plan:   Encounter Diagnoses  Name Primary?  . Hyperlipidemia, unspecified hyperlipidemia type Yes  . Prediabetes   . Aortic valve stenosis, etiology of cardiac valve disease unspecified   . Obesity, unspecified classification, unspecified obesity type, unspecified whether serious comorbidity present      -rx simvastatin and counseled on low fat diet and regular exercise for lipids -Gave [facility] discount application -will refer to cards for f/u valvular disease- he hasn't been seen for 3 years  -Counseled on prediabetes -pt to follow up 6 wk to check medication tolerance and that he has cards appt. RTO sooner prn

## 2017-03-31 NOTE — Patient Instructions (Signed)
Dieta restringida en grasas y colesterol (Fat and Cholesterol Restricted Diet) Los niveles altos de grasa y colesterol en la sangre pueden causar varios problemas de salud, como enfermedades del corazn, de los vasos sanguneos, de la [place], del hgado y del pncreas. Las grasas son fuentes de Teacher, early years/pre concentrada que existen en varias formas. Consumir en exceso ciertos tipos de grasa, incluidas las grasas saturadas, puede ser perjudicial. El colesterol es una sustancia que el organismo necesita en pequeas cantidades. El cuerpo [place] todo el colesterol que necesita. El exceso de colesterol proviene de los alimentos que come. Si sus niveles de colesterol y grasas saturadas en la sangre son elevados, puede tener problemas de salud, dado que el exceso de grasa y Research officer, trade union se acumula en las paredes de los vasos sanguneos, provocando su Public librarian. Elegir los Retail buyer su ingesta de grasa y [place]. Esto le ayudar a [employer] de estas sustancias en la sangre dentro de los lmites normales y a reducir el riesgo de Public librarian. EN QU CONSISTE EL PLAN? El mdico le recomienda que:  Limite la ingesta de grasas a alrededor del _______% del total de caloras por da.  Limite la cantidad de colesterol en su dieta a menos de _________mg Darlin Coco.  Consuma entre 73 y 20gramos de [employer]. QU TIPOS DE GRASAS DEBO ELEGIR?  Elija grasas saludables con mayor frecuencia. Elija las grasas monoinsaturadas y poliinsaturadas, como el aceite de oliva y canola, las semillas de [place], las nueces, las almendras y las semillas.  Consuma ms grasas omega-3. Las mejores opciones incluyen salmn, caballa, sardinas, atn, aceite de lino y semillas de lino molidas. Trate de consumir pescado al [employer] veces por semana.  Limite el consumo de grasas saturadas. Estas se encuentran principalmente en los productos de origen animal, como las carnes,  la Henry y la crema. Las grasas saturadas de origen vegetal incluyen aceite de palma, de palmiste y de coco.  Evite los alimentos con aceites parcialmente hidrogenados. Estos contienen grasas trans. Entre los ejemplos de alimentos con grasas trans se incluyen margarinas en barra, algunas margarinas untables, galletas dulces o saladas y otros productos horneados.  [place]? Estas pautas para una alimentacin saludable le ayudarn a controlar su ingesta de grasa y colesterol:  Lea las etiquetas de los alimentos detenidamente para identificar los que contienen grasas trans o altas cantidades de grasas saturadas.  Llene la mitad del plato con verduras y ensaladas de hojas verdes.  Llene un cuarto del plato con cereales integrales. Busque la palabra "integral" en Equities trader de la lista de ingredientes.  Llene un cuarto del plato con alimentos con protenas magras.  Mitchell a dos porciones por da. Elija frutas en lugar de jugos.  Coma ms alimentos que contienen fibra, como [place], [place], [place], frijoles, guisantes y [place].  Consuma ms comida casera y menos de restaurante, de buf y comida rpida.  Limite o evite el alcohol.  Limite los alimentos con alto contenido de almidn y Location manager.  Limite el consumo de alimentos fritos.  Cocine los alimentos utilizando mtodos que no sean la fritura. Las opciones de coccin ms [place] son Teacher, music, Adult nurse, Interior and spatial designer y asar a Administrator, arts.  Baje de peso si es necesario. Perder solo del 5 al 10% de su peso inicial puede ayudarle a mejorar su estado de salud general y a Producer, television/film/video, como la diabetes y las enfermedades cardacas. QU ALIMENTOS PUEDO COMER? Cereales Cereales integrales,  como los panes de salvado o [place], las [place], los cereales y las pastas. Avena sin endulzar, trigo, [place], quinua o arroz integral. Tortillas de harina de maz o de salvado. Verduras Verduras  frescas o congeladas (crudas, al vapor, asadas o grilladas). Ensaladas de hojas verdes. Lambert Mody Lambert Mody frescas, en conserva (en su jugo natural) o frutas congeladas. Carnes y otros productos con protenas Carne de res molida (al 85% o ms [place]), carne de res de animales alimentados con pastos o carne de res sin la grasa. Pollo o pavo sin piel. Carne de pollo o de [place]. Cerdo sin la grasa. Todos los pescados y frutos de mar. Huevos. Porotos, guisantes o lentejas secos. Frutos secos o semillas sin sal. Frijoles secos o en lata sin sal. Lcteos Productos lcteos con bajo contenido de grasas, como [place] o al 1%, quesos reducidos en grasas o al 2%, ricota con bajo contenido de grasas o [employer], o yogur natural con bajo contenido de [place]. Grasas y [employer] Express untables que no contengan grasas trans. [place] y condimentos para ensaladas livianos o reducidos en grasas. [place]. Aceites de oliva, canola, ssamo o crtamo. [place] natural de cacahuate o almendra (elija la que no tenga agregado de aceite o azcar). Los artculos mencionados arriba pueden no ser [employer] Foods Company de las bebidas o los alimentos recomendados. Comunquese con el nutricionista para conocer ms opciones. QU ALIMENTOS NO SE RECOMIENDAN? Cereales Pan blanco. Pastas blancas. Arroz blanco. Pan de maz. Bagels, pasteles y croissants. Galletas saladas que contengan grasas trans. Verduras Papas blancas. [place] con crema o fritas. Verduras en [place]. Lambert Mody Frutas secas. Fruta enlatada en almbar liviano o espeso. Jugo de frutas. Carnes y otros productos con protenas [place] de carne con Lobbyist. Costillas, alas de pollo, tocineta, salchicha, mortadela, salame, chinchulines, tocino, perros calientes, salchichas alemanas y embutidos envasados. Hgado y otros rganos. Lcteos Leche entera o al 2%, crema, mezcla de Mack y crema, y queso crema. Quesos enteros. Yogur entero o  endulzado. Quesos con toda su grasa. Cremas no lcteas y coberturas batidas. Quesos procesados, quesos para untar o cuajadas. Dulces y postres Jarabe de maz, azcares, miel y Control and instrumentation engineer. Caramelos. Mermelada y [place]. Chrissie Noa. Cereales endulzados. Galletas, pasteles, bizcochuelos, donas, muffins y helado. Grasas y aceites [place], [place] en barra, [place] de [place], [place], [place] clarificada o grasa de tocino. Aceites de coco, de palmiste o de palma. Bebidas Alcohol. Bebidas endulzadas (como refrescos, limonadas y bebidas frutales o ponches). Los artculos mencionados arriba pueden no ser [employer] Foods Company de las bebidas y los alimentos que se Higher education careers adviser. Comunquese con el nutricionista para obtener ms informacin. Esta informacin no tiene Marine scientist el consejo del mdico. Asegrese de hacerle al mdico cualquier pregunta que tenga. Document Released: 06/24/2005 Document Revised: 07/15/2014 Document Reviewed: 09/22/2013 Elsevier Interactive Patient Education  2017 Elsevier Inc.    Prediabetes (Prediabetes) QU ES LA PREDIABETES? La prediabetes es la enfermedad que presenta un nivel de azcar en la sangre (glucemia) ms alto de lo normal, pero no lo suficientemente alto como para que le diagnostiquen diabetes tipo2. El hecho de ser prediabtico lo pone en riesgo de desarrollar diabetes tipo2 (diabetes mellitus tipo2). La prediabetes tambin se puede llamar intolerancia a la glucosa o glucosa alterada en ayunas. Generalmente, la prediabetes no causa sntomas. El mdico puede diagnosticar esta enfermedad por los anlisis de [place]. Los anlisis para Hydrographic surveyor la prediabetes se pueden realizar si usted tiene sobrepeso y si presenta al menos un factor de riesgo ms  de prediabetes. Entre los factores de riesgo de prediabetes, se incluyen los siguientes:  Best boy un familiar con diabetes tipo2.  Sobrepeso u obesidad.  Tener ms de 39 aos.  Ser descendiente de indgenas  norteamericanos, afroamericanos, hispanos o latinos, o asiticos o isleos del Pacfico.  Tener un estilo de vida inactivo (sedentario).  Tener antecedentes de diabetes gestacional o sndrome de ovario poliqustico (SOP).  Tener niveles bajos del colesterol bueno (HDL-C) o niveles altos de grasas en la sangre (triglicridos).  Tener hipertensin arterial. QU ES LA GLUCEMIA Y CMO SE MIDE? La glucemia hace referencia a la cantidad de glucosa que tiene en el torrente sanguneo. La glucosa proviene de los alimentos que contienen azcar y almidn (carbohidratos) que el organismo descompone para formar glucosa. El nivel de glucemia se puede medir en mg/dl (miligramos por decilitro) o mmol/l (milimoles por litro).La glucemia puede controlarse con uno o ms de los siguientes anlisis de sangre:  Medicin de la glucemia en [place]. No se le permitir comer (tendr que [employer]) durante al menos 8horas antes de que se tome una [place] de [place]. ? Un rango normal de glucemia en ayunas es de 70 a 100mg /dl (de 3,9 a 5,74mmol/l).  Un anlisis de sangre de A1c (hemoglobina A1c). Este anlisis proporciona informacin sobre el control de la glucemia durante los ltimos 2 o 75meses.  Prueba de tolerancia a la glucosa oral (PTGO). Esta prueba mide la glucemia dos veces: ? Despus del ayuno. Este es el valor inicial. ? Dos horas despus de ingerir una bebida que contiene glucosa. Pueden diagnosticarle prediabetes en los siguientes casos:  Si la glucemia en ayunas es de 100 a 125mg /dl (de 5,6 a 6,11mmol/l).  Si el nivel de A1c es del 5,7% al 6,4%.  Si el resultado de la PTGO es de 140 a 199mg /dl (de 7,8 a 41mmol/l). Estos anlisis de sangre se pueden repetir para Physicist, medical diagnstico. QU SUCEDE SI LA GLUCEMIA ES DEMASIADO ALTA? El pncreas produce una hormona (insulina) que ayuda a Civil Service fast streamer glucosa desde el torrente sanguneo hacia las clulas. Cuando las clulas no responden de forma  [place] a la insulina que el organismo produce (resistencia a la insulina), el exceso de glucosa se acumula en la sangre en vez de dirigirse hacia las clulas. Como consecuencia, se puede desarrollar glucemia alta (hiperglucemia), que puede causar muchas complicaciones. Este es uno de los sntomas de la prediabetes. QU PUEDE SUCEDER SI LA GLUCEMIA PERMANECE MS ALTA DE LO NORMAL DURANTE MUCHO TIEMPO? Es peligroso Systems analyst glucemia alta durante mucho tiempo. Demasiada glucosa en la sangre puede daar los nervios y los vasos sanguneos. El dao a largo plazo puede provocar complicaciones de la diabetes, por ejemplo:  Cardiopata.  Ictus.  Ceguera.  Enfermedad renal.  Depresin.  Mala circulacin en los pies y en las piernas, que podra llevar a la extraccin quirrgica (amputacin) en casos graves. CMO SE PUEDE EVITAR QUE LA PREDIABETES SE CONVIERTA EN DIABETES TIPO2? Para prevenir la diabetes tipo2, tome las siguientes medidas:  Haga actividad fsica. ? Haga actividad fsica de intensidad moderada durante al menos 91minutos como mnimo 5das por Entergy Corporation, o tanto como le haya indicado el mdico. Podra hacer caminatas dinmicas, ciclismo o [place]. ? Pregntele al mdico qu actividades son seguras para usted. Una combinacin de actividades puede ser la mejor opcin, por ejemplo, caminar, practicar natacin, andar en bicicleta y hacer entrenamiento de fuerza.  Baje de Charles Schwab se lo haya indicado el mdico. ? Domingo Sep el 5% y  el 7% del Engineer, site puede revertir la resistencia a la insulina. ? El mdico puede determinar cuntos kilos tiene que bajar y [place] a que adelgace de Geographical information systems officer segura.  Siga un plan de alimentacin saludable. Este incluye consumir protenas magras, hidratos de carbono complejos, frutas y verduras frescas, productos lcteos con bajo contenido de grasa y grasas saludables. ? Siga las indicaciones del mdico respecto de las restricciones para  las comidas o las bebidas. ? Programe una cita con un especialista en alimentacin y nutricin (nutricionista certificado) para que lo ayude a Manufacturing engineer plan de alimentacin saludable adecuado para usted.  No fume ni consuma ningn producto que contenga tabaco, lo que incluye cigarrillos, tabaco de Higher education careers adviser y Psychologist, sport and exercise. Si necesita ayuda para dejar de fumar, consulte al [employer].  [employer] de venta libre y los recetados como se lo haya indicado el mdico. Es posible que le receten medicamentos que ayuden a disminuir el riesgo de tener diabetes tipo2. Esta informacin no tiene Marine scientist el consejo del mdico. Asegrese de hacerle al mdico cualquier pregunta que tenga. Document Released: 08/15/2015 Document Revised: 08/15/2015 Document Reviewed: 08/15/2015 Elsevier Interactive Patient Education  [employer].

## 2017-04-22 ENCOUNTER — Ambulatory Visit: Payer: Self-pay | Admitting: Physician Assistant

## 2017-05-12 ENCOUNTER — Encounter: Payer: Self-pay | Admitting: Physician Assistant

## 2017-05-12 ENCOUNTER — Other Ambulatory Visit (HOSPITAL_COMMUNITY)
Admission: RE | Admit: 2017-05-12 | Discharge: 2017-05-12 | Disposition: A | Discharge status: Home / Self Care | Payer: MEDICAID | Source: Ambulatory Visit | Attending: Physician Assistant | Admitting: Physician Assistant

## 2017-05-12 ENCOUNTER — Ambulatory Visit: Payer: Self-pay | Admitting: Physician Assistant

## 2017-05-12 VITALS — BP 146/70 | HR 70 | Temp 97.5°F | Ht 63.25 in | Wt 207.8 lb

## 2017-05-12 DIAGNOSIS — R7303 Prediabetes: Secondary | ICD-10-CM

## 2017-05-12 DIAGNOSIS — I10 Essential (primary) hypertension: Secondary | ICD-10-CM | POA: Insufficient documentation

## 2017-05-12 DIAGNOSIS — D75839 Thrombocytosis, unspecified: Secondary | ICD-10-CM

## 2017-05-12 DIAGNOSIS — D473 Essential (hemorrhagic) thrombocythemia: Secondary | ICD-10-CM | POA: Insufficient documentation

## 2017-05-12 DIAGNOSIS — Z91199 Patient's noncompliance with other medical treatment and regimen due to unspecified reason: Secondary | ICD-10-CM

## 2017-05-12 DIAGNOSIS — E785 Hyperlipidemia, unspecified: Secondary | ICD-10-CM | POA: Insufficient documentation

## 2017-05-12 DIAGNOSIS — R7989 Other specified abnormal findings of blood chemistry: Secondary | ICD-10-CM | POA: Insufficient documentation

## 2017-05-12 DIAGNOSIS — E669 Obesity, unspecified: Secondary | ICD-10-CM

## 2017-05-12 DIAGNOSIS — Z9119 Patient's noncompliance with other medical treatment and regimen: Secondary | ICD-10-CM

## 2017-05-12 LAB — CBC WITH DIFFERENTIAL/PLATELET
BASOS ABS: 0 10*3/uL (ref 0.0–0.1)
BASOS PCT: 0 %
EOS ABS: 0.2 10*3/uL (ref 0.0–0.7)
EOS PCT: 3 %
HCT: 45 % (ref 39.0–52.0)
Hemoglobin: 15.3 g/dL (ref 13.0–17.0)
LYMPHS PCT: 28 %
Lymphs Abs: 2 10*3/uL (ref 0.7–4.0)
MCH: 31.1 pg (ref 26.0–34.0)
MCHC: 34 g/dL (ref 30.0–36.0)
MCV: 91.5 fL (ref 78.0–100.0)
MONO ABS: 0.6 10*3/uL (ref 0.1–1.0)
Monocytes Relative: 8 %
Neutro Abs: 4.2 10*3/uL (ref 1.7–7.7)
Neutrophils Relative %: 61 %
PLATELETS: 307 10*3/uL (ref 150–400)
RBC: 4.92 MIL/uL (ref 4.22–5.81)
RDW: 13.1 % (ref 11.5–15.5)
WBC: 7.1 10*3/uL (ref 4.0–10.5)

## 2017-05-12 LAB — LIPID PANEL
CHOL/HDL RATIO: 6.4 ratio
Cholesterol: 237 mg/dL — ABNORMAL HIGH (ref 0–200)
HDL: 37 mg/dL — AB (ref >40)
LDL CALC: 179 mg/dL — AB (ref 0–99)
Triglycerides: 104 mg/dL (ref <150)
VLDL: 21 mg/dL (ref 0–40)

## 2017-05-12 LAB — COMPREHENSIVE METABOLIC PANEL
ALT: 39 U/L (ref 17–63)
AST: 26 U/L (ref 15–41)
Albumin: 4.1 g/dL (ref 3.5–5.0)
Alkaline Phosphatase: 50 U/L (ref 38–126)
Anion gap: 14 (ref 5–15)
BILIRUBIN TOTAL: 0.7 mg/dL (ref 0.3–1.2)
BUN: 14 mg/dL (ref 6–20)
CO2: 26 mmol/L (ref 22–32)
Calcium: 9.3 mg/dL (ref 8.9–10.3)
Chloride: 98 mmol/L — ABNORMAL LOW (ref 101–111)
Creatinine, Ser: 0.73 mg/dL (ref 0.61–1.24)
GFR calc non Af Amer: >60 mL/min (ref >60)
Glucose, Bld: 118 mg/dL — ABNORMAL HIGH (ref 65–99)
Potassium: 3.9 mmol/L (ref 3.5–5.1)
Sodium: 138 mmol/L (ref 135–145)
TOTAL PROTEIN: 7.5 g/dL (ref 6.5–8.1)

## 2017-05-12 LAB — TSH: TSH: 1.544 u[IU]/mL (ref 0.350–4.500)

## 2017-05-12 LAB — HEMOGLOBIN A1C
HEMOGLOBIN A1C: 6.3 % — AB (ref 4.8–5.6)
Mean Plasma Glucose: 134.11 mg/dL

## 2017-05-12 NOTE — Progress Notes (Signed)
BP (!) 146/70 (BP Location: Left Arm, Patient Position: Sitting, Cuff Size: Normal)   Pulse 70   Temp (!) 97.5 F (36.4 C)   Ht 5' 3.25" (1.607 m)   Wt 207 lb 12 oz (94.2 kg)   SpO2 95%   BMI 36.51 kg/m    Subjective:    Patient ID: Kevin Barron, male    DOB: 05-08-1966, 51 y.o.   MRN: [id number]  HPI: Kevin Barron is a 51 y.o. male presenting on 05/12/2017 for Follow-up   HPI   He is not taking his simvastatin.  Pt is using lemon water and aloe vera instead  Relevant past medical, surgical, family and social history reviewed and updated as indicated. Interim medical history since our last visit reviewed. Allergies and medications reviewed and updated.  CURRENT MEDS: None  Review of Systems  Constitutional: Negative for appetite change, chills, diaphoresis, fatigue, fever and unexpected weight change.  HENT: Negative for congestion, dental problem, drooling, ear pain, facial swelling, hearing loss, mouth sores, sneezing, sore throat, trouble swallowing and voice change.   Eyes: Negative for pain, discharge, redness, itching and visual disturbance.  Respiratory: Negative for cough, choking, shortness of breath and wheezing.   Cardiovascular: Negative for chest pain, palpitations and leg swelling.  Gastrointestinal: Negative for abdominal pain, blood in stool, constipation, diarrhea and vomiting.  Endocrine: Negative for cold intolerance, heat intolerance and polydipsia.  Genitourinary: Negative for decreased urine volume, dysuria and hematuria.  Musculoskeletal: Negative for arthralgias, back pain and gait problem.  Skin: Negative for rash.  Allergic/Immunologic: Negative for environmental allergies.  Neurological: Negative for seizures, syncope, light-headedness and headaches.  Hematological: Negative for adenopathy.  Psychiatric/Behavioral: Negative for agitation, dysphoric mood and suicidal ideas. The patient is not nervous/anxious.     Per HPI  unless specifically indicated above     Objective:    BP (!) 146/70 (BP Location: Left Arm, Patient Position: Sitting, Cuff Size: Normal)   Pulse 70   Temp (!) 97.5 F (36.4 C)   Ht 5' 3.25" (1.607 m)   Wt 207 lb 12 oz (94.2 kg)   SpO2 95%   BMI 36.51 kg/m   Wt Readings from Last 3 Encounters:  05/12/17 207 lb 12 oz (94.2 kg)  03/31/17 211 lb 12 oz (96 kg)  12/23/16 203 lb 8 oz (92.3 kg)    Physical Exam  Constitutional: He is oriented to person, place, and time. He appears well-developed and well-nourished.  HENT:  Head: Normocephalic and atraumatic.  Neck: Neck supple.  Cardiovascular: Normal rate and regular rhythm.  Pulmonary/Chest: Effort normal and breath sounds normal. He has no wheezes.  Abdominal: Soft. Bowel sounds are normal. There is no hepatosplenomegaly. There is no tenderness.  Musculoskeletal: He exhibits no edema.  Lymphadenopathy:    He has no cervical adenopathy.  Neurological: He is alert and oriented to person, place, and time.  Skin: Skin is warm and dry.  Psychiatric: He has a normal mood and affect. His behavior is normal.  Vitals reviewed.       Assessment & Plan:   Encounter Diagnoses  Name Primary?  . Hyperlipidemia, unspecified hyperlipidemia type Yes  . Prediabetes   . Elevated blood pressure reading in office with diagnosis of hypertension   . Personal history of noncompliance with medical treatment, presenting hazards to health   . Obesity, unspecified classification, unspecified obesity type, unspecified whether serious comorbidity present     -will Check fasting labs -pt to follow up 1 month to recheck  bp

## 2017-06-11 ENCOUNTER — Encounter: Payer: Self-pay | Admitting: Physician Assistant

## 2017-06-11 ENCOUNTER — Ambulatory Visit: Payer: Self-pay | Admitting: Physician Assistant

## 2017-06-11 VITALS — BP 126/80 | HR 70 | Temp 97.3°F | Ht 63.25 in | Wt 204.0 lb

## 2017-06-11 DIAGNOSIS — E785 Hyperlipidemia, unspecified: Secondary | ICD-10-CM

## 2017-06-11 DIAGNOSIS — E669 Obesity, unspecified: Secondary | ICD-10-CM

## 2017-06-11 DIAGNOSIS — R7303 Prediabetes: Secondary | ICD-10-CM

## 2017-06-11 NOTE — Progress Notes (Signed)
BP 126/80 (BP Location: Left Arm, Patient Position: Sitting, Cuff Size: Normal)   Pulse 70   Temp (!) 97.3 F (36.3 C)   Ht 5' 3.25" (1.607 m)   Wt 204 lb (92.5 kg)   SpO2 97%   BMI 35.85 kg/m    Subjective:    Patient ID: Kevin Barron, male    DOB: [birth year], 51 y.o.   MRN: [id number]  HPI: Kevin Barron is a 51 y.o. male presenting on 06/11/2017 for Hypertension   HPI   Pt feels well and has no complaints  Relevant past medical, surgical, family and social history reviewed and updated as indicated. Interim medical history since our last visit reviewed. Allergies and medications reviewed and updated.  Review of Systems  Constitutional: Negative for appetite change, chills, diaphoresis, fatigue, fever and unexpected weight change.  HENT: Negative for congestion, dental problem, drooling, ear pain, facial swelling, hearing loss, mouth sores, sneezing, sore throat, trouble swallowing and voice change.   Eyes: Negative for pain, discharge, redness, itching and visual disturbance.  Respiratory: Negative for cough, choking, shortness of breath and wheezing.   Cardiovascular: Negative for chest pain, palpitations and leg swelling.  Gastrointestinal: Negative for abdominal pain, blood in stool, constipation, diarrhea and vomiting.  Endocrine: Negative for cold intolerance, heat intolerance and polydipsia.  Genitourinary: Negative for decreased urine volume, dysuria and hematuria.  Musculoskeletal: Negative for arthralgias, back pain and gait problem.  Skin: Negative for rash.  Allergic/Immunologic: Negative for environmental allergies.  Neurological: Negative for seizures, syncope, light-headedness and headaches.  Hematological: Negative for adenopathy.  Psychiatric/Behavioral: Negative for agitation, dysphoric mood and suicidal ideas. The patient is not nervous/anxious.     Per HPI unless specifically indicated above     Objective:    BP 126/80 (BP  Location: Left Arm, Patient Position: Sitting, Cuff Size: Normal)   Pulse 70   Temp (!) 97.3 F (36.3 C)   Ht 5' 3.25" (1.607 m)   Wt 204 lb (92.5 kg)   SpO2 97%   BMI 35.85 kg/m   Wt Readings from Last 3 Encounters:  06/11/17 204 lb (92.5 kg)  05/12/17 207 lb 12 oz (94.2 kg)  03/31/17 211 lb 12 oz (96 kg)    Physical Exam  Constitutional: He is oriented to person, place, and time. He appears well-developed and well-nourished.  HENT:  Head: Normocephalic and atraumatic.  Neck: Neck supple.  Cardiovascular: Normal rate and regular rhythm.  Pulmonary/Chest: Effort normal and breath sounds normal. He has no wheezes.  Abdominal: Soft. Bowel sounds are normal. There is no hepatosplenomegaly. There is no tenderness.  Musculoskeletal: He exhibits no edema.  Lymphadenopathy:    He has no cervical adenopathy.  Neurological: He is alert and oriented to person, place, and time.  Skin: Skin is warm and dry.  Psychiatric: He has a normal mood and affect. His behavior is normal.  Vitals reviewed.   Results for orders placed or performed during the hospital encounter of 05/12/17  HgB A1c  Result Value Ref Range   Hgb A1c MFr Bld 6.3 (H) 4.8 - 5.6 %   Mean Plasma Glucose 134.11 mg/dL  Comprehensive metabolic panel  Result Value Ref Range   Sodium 138 135 - 145 mmol/L   Potassium 3.9 3.5 - 5.1 mmol/L   Chloride 98 (L) 101 - 111 mmol/L   CO2 26 22 - 32 mmol/L   Glucose, Bld 118 (H) 65 - 99 mg/dL   BUN 14 6 - 20 mg/dL   Creatinine,  Ser 0.73 0.61 - 1.24 mg/dL   Calcium 9.3 8.9 - 10.3 mg/dL   Total Protein 7.5 6.5 - 8.1 g/dL   Albumin 4.1 3.5 - 5.0 g/dL   AST 26 15 - 41 U/L   ALT 39 17 - 63 U/L   Alkaline Phosphatase 50 38 - 126 U/L   Total Bilirubin 0.7 0.3 - 1.2 mg/dL   GFR calc non Af Amer >60 >60 mL/min   GFR calc Af Amer >60 >60 mL/min   Anion gap 14 5 - 15  Lipid panel  Result Value Ref Range   Cholesterol 237 (H) 0 - 200 mg/dL   Triglycerides 104 <150 mg/dL   HDL 37 (L)  >40 mg/dL   Total CHOL/HDL Ratio 6.4 RATIO   VLDL 21 0 - 40 mg/dL   LDL Cholesterol 179 (H) 0 - 99 mg/dL  TSH  Result Value Ref Range   TSH 1.544 0.350 - 4.500 uIU/mL  CBC w/Diff/Platelet  Result Value Ref Range   WBC 7.1 4.0 - 10.5 K/uL   RBC 4.92 4.22 - 5.81 MIL/uL   Hemoglobin 15.3 13.0 - 17.0 g/dL   HCT 45.0 39.0 - 52.0 %   MCV 91.5 78.0 - 100.0 fL   MCH 31.1 26.0 - 34.0 pg   MCHC 34.0 30.0 - 36.0 g/dL   RDW 13.1 11.5 - 15.5 %   Platelets 307 150 - 400 K/uL   Neutrophils Relative % 61 %   Neutro Abs 4.2 1.7 - 7.7 K/uL   Lymphocytes Relative 28 %   Lymphs Abs 2.0 0.7 - 4.0 K/uL   Monocytes Relative 8 %   Monocytes Absolute 0.6 0.1 - 1.0 K/uL   Eosinophils Relative 3 %   Eosinophils Absolute 0.2 0.0 - 0.7 K/uL   Basophils Relative 0 %   Basophils Absolute 0.0 0.0 - 0.1 K/uL      Assessment & Plan:    Encounter Diagnoses  Name Primary?  . Hyperlipidemia, unspecified hyperlipidemia type Yes  . Prediabetes   . Obesity, unspecified classification, unspecified obesity type, unspecified whether serious comorbidity present      -reviewed labs with pt -pt to continue simvastation -counseled pt on prediabetes, proper diet for prediabetes and low-fat diet -recommended regular exercise and weight management -pt to follow up in 3 months.  RTO sooner prn

## 2017-09-05 ENCOUNTER — Other Ambulatory Visit (HOSPITAL_COMMUNITY)
Admission: RE | Admit: 2017-09-05 | Discharge: 2017-09-05 | Disposition: A | Discharge status: Home / Self Care | Payer: Self-pay | Source: Ambulatory Visit | Attending: Physician Assistant | Admitting: Physician Assistant

## 2017-09-05 DIAGNOSIS — E785 Hyperlipidemia, unspecified: Secondary | ICD-10-CM

## 2017-09-05 LAB — COMPREHENSIVE METABOLIC PANEL
ALBUMIN: 4.1 g/dL (ref 3.5–5.0)
ALT: 38 U/L (ref 17–63)
AST: 30 U/L (ref 15–41)
Alkaline Phosphatase: 44 U/L (ref 38–126)
Anion gap: 11 (ref 5–15)
BUN: 15 mg/dL (ref 6–20)
CHLORIDE: 101 mmol/L (ref 101–111)
CO2: 24 mmol/L (ref 22–32)
Calcium: 8.8 mg/dL — ABNORMAL LOW (ref 8.9–10.3)
Creatinine, Ser: 0.67 mg/dL (ref 0.61–1.24)
GFR calc Af Amer: >60 mL/min (ref >60)
GFR calc non Af Amer: >60 mL/min (ref >60)
GLUCOSE: 128 mg/dL — AB (ref 65–99)
POTASSIUM: 3.9 mmol/L (ref 3.5–5.1)
Sodium: 136 mmol/L (ref 135–145)
Total Bilirubin: 0.7 mg/dL (ref 0.3–1.2)
Total Protein: 7.5 g/dL (ref 6.5–8.1)

## 2017-09-05 LAB — LIPID PANEL
CHOLESTEROL: 170 mg/dL (ref 0–200)
HDL: 35 mg/dL — ABNORMAL LOW (ref >40)
LDL CALC: 113 mg/dL — AB (ref 0–99)
Total CHOL/HDL Ratio: 4.9 RATIO
Triglycerides: 109 mg/dL (ref <150)
VLDL: 22 mg/dL (ref 0–40)

## 2017-09-09 ENCOUNTER — Encounter: Payer: Self-pay | Admitting: Physician Assistant

## 2017-09-09 ENCOUNTER — Ambulatory Visit: Payer: Self-pay | Admitting: Physician Assistant

## 2017-09-09 VITALS — BP 100/65 | HR 66 | Temp 97.1°F | Ht 63.25 in | Wt 210.5 lb

## 2017-09-09 DIAGNOSIS — I350 Nonrheumatic aortic (valve) stenosis: Secondary | ICD-10-CM

## 2017-09-09 DIAGNOSIS — E785 Hyperlipidemia, unspecified: Secondary | ICD-10-CM

## 2017-09-09 DIAGNOSIS — E669 Obesity, unspecified: Secondary | ICD-10-CM

## 2017-09-09 DIAGNOSIS — R7303 Prediabetes: Secondary | ICD-10-CM

## 2017-09-09 DIAGNOSIS — B360 Pityriasis versicolor: Secondary | ICD-10-CM

## 2017-09-09 MED ORDER — KETOCONAZOLE 2 % EX CREA: TOPICAL_CREAM | CUTANEOUS | 0 refills | Status: DC

## 2017-09-09 NOTE — Progress Notes (Signed)
BP 100/65 (BP Location: Right Arm, Patient Position: Sitting, Cuff Size: Large)   Pulse 66   Temp (!) 97.1 F (36.2 C) (Oral)   Ht 5' 3.25" (1.607 m)   Wt 210 lb 8 oz (95.5 kg)   SpO2 97%   BMI 36.99 kg/m    Subjective:    Patient ID: Kevin Barron, male    DOB: Mar 06, 1966, 52 y.o.   MRN: [id number]  HPI: Kevin Barron is a 52 y.o. male presenting on 09/09/2017 for Hyperlipidemia   HPI   Pt concerned about places on forehead. They started about 3 or 4 months and are getting bigger  Relevant past medical, surgical, family and social history reviewed and updated as indicated. Interim medical history since our last visit reviewed. Allergies and medications reviewed and updated.   Current Outpatient Medications:  .  simvastatin (ZOCOR) 20 MG tablet, 1 po qhs for cholesterol.  Tome una tableta por boca al dormir (Patient taking differently: Take 20 mg by mouth at bedtime. 1 po qhs for cholesterol.  Tome una tableta por boca al dormir), Disp: 30 tablet, Rfl: 4   Review of Systems  Constitutional: Negative for appetite change, chills, diaphoresis, fatigue, fever and unexpected weight change.  HENT: Negative for congestion, dental problem, drooling, ear pain, facial swelling, hearing loss, mouth sores, sneezing, sore throat, trouble swallowing and voice change.   Eyes: Negative for pain, discharge, redness, itching and visual disturbance.  Respiratory: Negative for cough, choking, shortness of breath and wheezing.   Cardiovascular: Negative for chest pain, palpitations and leg swelling.  Gastrointestinal: Negative for abdominal pain, blood in stool, constipation, diarrhea and vomiting.  Endocrine: Negative for cold intolerance, heat intolerance and polydipsia.  Genitourinary: Negative for decreased urine volume, dysuria and hematuria.  Musculoskeletal: Negative for arthralgias, back pain and gait problem.  Skin: Negative for rash.  Allergic/Immunologic: Negative  for environmental allergies.  Neurological: Negative for seizures, syncope, light-headedness and headaches.  Hematological: Negative for adenopathy.  Psychiatric/Behavioral: Negative for agitation, dysphoric mood and suicidal ideas. The patient is not nervous/anxious.     Per HPI unless specifically indicated above     Objective:    BP 100/65 (BP Location: Right Arm, Patient Position: Sitting, Cuff Size: Large)   Pulse 66   Temp (!) 97.1 F (36.2 C) (Oral)   Ht 5' 3.25" (1.607 m)   Wt 210 lb 8 oz (95.5 kg)   SpO2 97%   BMI 36.99 kg/m   Wt Readings from Last 3 Encounters:  09/09/17 210 lb 8 oz (95.5 kg)  06/11/17 204 lb (92.5 kg)  05/12/17 207 lb 12 oz (94.2 kg)    Physical Exam  Constitutional: He is oriented to person, place, and time. He appears well-developed and well-nourished.  HENT:  Head: Normocephalic and atraumatic.  Neck: Neck supple.  Cardiovascular: Normal rate and regular rhythm.  Murmur heard. Pulmonary/Chest: Effort normal and breath sounds normal. He has no wheezes.  Abdominal: Soft. Bowel sounds are normal. There is no hepatosplenomegaly. There is no tenderness.  Musculoskeletal: He exhibits no edema.  Lymphadenopathy:    He has no cervical adenopathy.  Neurological: He is alert and oriented to person, place, and time.  Skin: Skin is warm and dry. Rash noted.  5 white round spots on face  Psychiatric: He has a normal mood and affect. His behavior is normal.  Vitals reviewed.   Results for orders placed or performed during the hospital encounter of 09/05/17  Lipid panel  Result Value Ref Range  Cholesterol 170 0 - 200 mg/dL   Triglycerides 109 <150 mg/dL   HDL 35 (L) >40 mg/dL   Total CHOL/HDL Ratio 4.9 RATIO   VLDL 22 0 - 40 mg/dL   LDL Cholesterol 113 (H) 0 - 99 mg/dL  Comprehensive metabolic panel  Result Value Ref Range   Sodium 136 135 - 145 mmol/L   Potassium 3.9 3.5 - 5.1 mmol/L   Chloride 101 101 - 111 mmol/L   CO2 24 22 - 32 mmol/L    Glucose, Bld 128 (H) 65 - 99 mg/dL   BUN 15 6 - 20 mg/dL   Creatinine, Ser 0.67 0.61 - 1.24 mg/dL   Calcium 8.8 (L) 8.9 - 10.3 mg/dL   Total Protein 7.5 6.5 - 8.1 g/dL   Albumin 4.1 3.5 - 5.0 g/dL   AST 30 15 - 41 U/L   ALT 38 17 - 63 U/L   Alkaline Phosphatase 44 38 - 126 U/L   Total Bilirubin 0.7 0.3 - 1.2 mg/dL   GFR calc non Af Amer >60 >60 mL/min   GFR calc Af Amer >60 >60 mL/min   Anion gap 11 5 - 15      Assessment & Plan:   Encounter Diagnoses  Name Primary?  . Hyperlipidemia, unspecified hyperlipidemia type Yes  . Tinea versicolor   . Prediabetes   . Aortic valve stenosis, etiology of cardiac valve disease unspecified   . Obesity, unspecified classification, unspecified obesity type, unspecified whether serious comorbidity present     -reviewed labs with pt -pt to continue simvastatin -rx topical nizoral for spots on face and gave handout -pt to follow up in 3 months.  RTO sooner prn

## 2017-09-09 NOTE — Patient Instructions (Signed)
Pitiriasis versicolor (Tinea Versicolor) La pitiriasis versicolor es una infeccin frecuente por hongos en la piel. Produce una erupcin cutnea que se manifiesta como manchas claras u oscuras. Es ms frecuente que aparezca en la piel del pecho, la espalda, el cuello o la parte superior de [place]. Esta afeccin es ms frecuente durante climas clidos. Con excepcin del aspecto de la piel, la pitiriasis versicolor por lo general no causa otros problemas. En la [employer], la infeccin desaparece en unas pocas semanas con un tratamiento. Pueden pasar algunos meses para que las Economist. CAUSAS La pitiriasis versicolor se produce cuando comienza a proliferar un tipo de hongo que normalmente est presente en la piel. Este hongo es una clase de Passenger transport manager. Se desconoce la causa exacta de la proliferacin. Esta afeccin no puede transmitirse de [place] persona a otra (es no contagiosa). FACTORES DE RIESGO Esta afeccin es ms probable en presencia de determinados factores, como los siguientes:  Calor y humedad.  Sudoracin excesiva.  Cambios hormonales.  Denita Lung.  El sistema de defensa (inmunitario) dbil. SNTOMAS Los sntomas de esta afeccin pueden incluir lo siguiente:  Una erupcin cutnea formada por manchas claras u oscuras. La erupcin cutnea puede consistir en lo siguiente: ? Zonas con manchas de color rosa o canela sobre piel clara. ? Zonas con manchas de color blanco o marrn sobre [employer]. ? Zonas de piel con manchas que no se broncean. ? Bordes bien definidos. ? Escamas en las zonas pigmentadas.  Picazn leve. DIAGNSTICO Por lo general, el mdico puede diagnosticar esta afeccin al observarle la piel. Durante el examen, el mdico puede usar una luz ultravioleta como ayuda para determinar el grado de la infeccin. En algunos casos, puede tomarse [place] de piel rascando la zona de la erupcin cutnea. Esta muestra se analizar con el microscopio  para Product manager proliferacin de Teacher, music. TRATAMIENTO El tratamiento de esta infeccin puede incluir lo siguiente:  [place] anticaspa que se aplica sobre la piel afectada al tomar una ducha o un bao de inmersin.  Jabones, lociones o cremas medicinales para la piel, de venta libre.  Medicamentos antimicticos recetados en forma de comprimidos o crema para la piel.  Medicamentos que ayudan a Barrister's clerk. Tequesta los medicamentos solamente como se lo haya indicado el mdico.  Aplique el champ anticaspa en la zona afectada si se lo ha indicado el mdico. Pueden indicarle que se frote la zona de piel afectada durante varios minutos cada da.  No se rasque la zona de piel afectada.  Evite el clima clido y hmedo.  No use cabinas de bronceado.  Trate de no transpirar demasiado. SOLICITE ATENCIN MDICA SI:  Los sntomas empeoran.  Tiene fiebre.  Presenta enrojecimiento, hinchazn o dolor en la zona de la erupcin cutnea.  Observa lquido, sangre o pus que salen de la erupcin cutnea.  La erupcin cutnea reaparece despus del tratamiento. Esta informacin no tiene Marine scientist el consejo del mdico. Asegrese de hacerle al mdico cualquier pregunta que tenga. Document Released: 04/03/2005 Document Revised: 07/15/2014 Document Reviewed: 04/05/2014 Elsevier Interactive Patient Education  [employer].

## 2017-11-11 ENCOUNTER — Ambulatory Visit: Payer: Self-pay | Admitting: Physician Assistant

## 2017-11-11 ENCOUNTER — Encounter: Payer: Self-pay | Admitting: Physician Assistant

## 2017-11-11 VITALS — BP 118/82 | HR 72 | Temp 97.7°F | Ht 63.25 in | Wt 212.5 lb

## 2017-11-11 DIAGNOSIS — B360 Pityriasis versicolor: Secondary | ICD-10-CM

## 2017-11-11 MED ORDER — SIMVASTATIN 20 MG PO TABS
ORAL_TABLET | ORAL | 4 refills | Status: DC
Start: 2017-11-11 — End: 2019-04-21

## 2017-11-11 NOTE — Progress Notes (Signed)
BP 118/82 (BP Location: Left Arm, Patient Position: Sitting, Cuff Size: Large)   Pulse 72   Temp 97.7 F (36.5 C) (Other (Comment))   Ht 5' 3.25" (1.607 m)   Wt 212 lb 8 oz (96.4 kg)   SpO2 93%   BMI 37.35 kg/m    Subjective:    Patient ID: Kevin Barron, male    DOB: [birth year], 52 y.o.   MRN: [id number]  HPI: Kevin Barron is a 52 y.o. male presenting on 11/11/2017 for No chief complaint on file.   HPI   Pt here for spots on face.  He was seen for this in march and given topical nizoral.  He says it didn't help any.  He is still using the cream.  Spots present for about 5 or 6 months  Relevant past medical, surgical, family and social history reviewed and updated as indicated. Interim medical history since our last visit reviewed. Allergies and medications reviewed and updated.   Current Outpatient Medications:  .  ketoconazole (NIZORAL) 2 % cream, Apply to affected area one daily for two weeks.   Aplique a la area afectada una ves diaria por [employer] Disp: 30 g, Rfl: 0 .  simvastatin (ZOCOR) 20 MG tablet, 1 po qhs for cholesterol.  Tome una tableta por boca al dormir (Patient taking differently: Take 20 mg by mouth at bedtime. 1 po qhs for cholesterol.  Tome una tableta por boca al dormir), Disp: 30 tablet, Rfl: 4   Review of Systems  Constitutional: Negative for appetite change, chills, diaphoresis, fatigue, fever and unexpected weight change.  HENT: Negative for congestion, dental problem, drooling, ear pain, facial swelling, hearing loss, mouth sores, sneezing, sore throat, trouble swallowing and voice change.   Eyes: Negative for pain, discharge, redness, itching and visual disturbance.  Respiratory: Negative for cough, choking, shortness of breath and wheezing.   Cardiovascular: Negative for chest pain, palpitations and leg swelling.  Gastrointestinal: Negative for abdominal pain, blood in stool, constipation, diarrhea and vomiting.  Endocrine:  Negative for cold intolerance, heat intolerance and polydipsia.  Genitourinary: Negative for decreased urine volume, dysuria and hematuria.  Musculoskeletal: Negative for arthralgias, back pain and gait problem.  Skin: Positive for rash.  Allergic/Immunologic: Negative for environmental allergies.  Neurological: Negative for seizures, syncope, light-headedness and headaches.  Hematological: Negative for adenopathy.  Psychiatric/Behavioral: Negative for agitation, dysphoric mood and suicidal ideas. The patient is not nervous/anxious.     Per HPI unless specifically indicated above     Objective:    BP 118/82 (BP Location: Left Arm, Patient Position: Sitting, Cuff Size: Large)   Pulse 72   Temp 97.7 F (36.5 C) (Other (Comment))   Ht 5' 3.25" (1.607 m)   Wt 212 lb 8 oz (96.4 kg)   SpO2 93%   BMI 37.35 kg/m   Wt Readings from Last 3 Encounters:  11/11/17 212 lb 8 oz (96.4 kg)  09/09/17 210 lb 8 oz (95.5 kg)  06/11/17 204 lb (92.5 kg)    Physical Exam  Constitutional: He appears well-developed and well-nourished.  HENT:  Head: Normocephalic and atraumatic.  Pulmonary/Chest: Effort normal. No respiratory distress.  Skin: Skin is warm and dry.  Psychiatric: He has a normal mood and affect. His behavior is normal.  Pt with 7 or 8 spots on face that are dry and without pigmentation  The largest spot is approximately 71mm  Nursing note and vitals reviewed.       Assessment & Plan:   Encounter Diagnosis  Name Primary?  . Tinea versicolor Yes     -will Refer to dermatology -Follow up next month as scheduled for lipids.  RTO sooner prn

## 2017-12-03 ENCOUNTER — Other Ambulatory Visit (HOSPITAL_COMMUNITY)
Admission: RE | Admit: 2017-12-03 | Discharge: 2017-12-03 | Disposition: A | Discharge status: Home / Self Care | Payer: Self-pay | Source: Ambulatory Visit | Attending: Physician Assistant | Admitting: Physician Assistant

## 2017-12-03 DIAGNOSIS — E785 Hyperlipidemia, unspecified: Secondary | ICD-10-CM

## 2017-12-03 LAB — COMPREHENSIVE METABOLIC PANEL
ALK PHOS: 46 U/L (ref 38–126)
ALT: 39 U/L (ref 17–63)
AST: 30 U/L (ref 15–41)
Albumin: 4.1 g/dL (ref 3.5–5.0)
Anion gap: 7 (ref 5–15)
BUN: 17 mg/dL (ref 6–20)
CALCIUM: 9.1 mg/dL (ref 8.9–10.3)
CHLORIDE: 101 mmol/L (ref 101–111)
CO2: 27 mmol/L (ref 22–32)
CREATININE: 0.71 mg/dL (ref 0.61–1.24)
GFR calc non Af Amer: >60 mL/min (ref >60)
Glucose, Bld: 126 mg/dL — ABNORMAL HIGH (ref 65–99)
Potassium: 3.9 mmol/L (ref 3.5–5.1)
SODIUM: 135 mmol/L (ref 135–145)
Total Bilirubin: 0.9 mg/dL (ref 0.3–1.2)
Total Protein: 7.6 g/dL (ref 6.5–8.1)

## 2017-12-03 LAB — LIPID PANEL
Cholesterol: 161 mg/dL (ref 0–200)
HDL: 33 mg/dL — AB (ref >40)
LDL CALC: 111 mg/dL — AB (ref 0–99)
TRIGLYCERIDES: 87 mg/dL (ref <150)
Total CHOL/HDL Ratio: 4.9 RATIO
VLDL: 17 mg/dL (ref 0–40)

## 2017-12-10 ENCOUNTER — Ambulatory Visit: Payer: Self-pay | Admitting: Physician Assistant

## 2017-12-18 ENCOUNTER — Encounter: Payer: Self-pay | Admitting: Physician Assistant

## 2017-12-22 ENCOUNTER — Encounter: Payer: Self-pay | Admitting: Physician Assistant

## 2019-03-18 ENCOUNTER — Other Ambulatory Visit: Payer: Self-pay

## 2019-03-18 DIAGNOSIS — Z20822 Contact with and (suspected) exposure to covid-19: Secondary | ICD-10-CM

## 2019-03-20 LAB — NOVEL CORONAVIRUS, NAA: SARS-CoV-2, NAA: DETECTED — AB

## 2019-03-22 ENCOUNTER — Telehealth: Payer: Self-pay | Admitting: Internal Medicine

## 2019-03-22 NOTE — Telephone Encounter (Signed)
Patient called, no answer. Left message to call back. Patient already aware of positive COVID test result, this is an MD follow up call. Will try again.  Angelica Chessman, MD

## 2019-03-27 ENCOUNTER — Emergency Department (HOSPITAL_COMMUNITY)
Admission: EM | Admit: 2019-03-27 | Discharge: 2019-03-27 | Disposition: A | Discharge status: Home / Self Care | Payer: Self-pay | Attending: Emergency Medicine | Admitting: Emergency Medicine

## 2019-03-27 ENCOUNTER — Other Ambulatory Visit: Payer: Self-pay

## 2019-03-27 ENCOUNTER — Encounter (HOSPITAL_COMMUNITY): Payer: Self-pay | Admitting: Emergency Medicine

## 2019-03-27 ENCOUNTER — Emergency Department (HOSPITAL_COMMUNITY): Payer: Self-pay

## 2019-03-27 DIAGNOSIS — E860 Dehydration: Secondary | ICD-10-CM

## 2019-03-27 DIAGNOSIS — R42 Dizziness and giddiness: Secondary | ICD-10-CM

## 2019-03-27 DIAGNOSIS — U071 COVID-19: Secondary | ICD-10-CM | POA: Insufficient documentation

## 2019-03-27 DIAGNOSIS — Z87891 Personal history of nicotine dependence: Secondary | ICD-10-CM | POA: Insufficient documentation

## 2019-03-27 LAB — CBC
HCT: 49.6 % (ref 39.0–52.0)
Hemoglobin: 16.5 g/dL (ref 13.0–17.0)
MCH: 30.8 pg (ref 26.0–34.0)
MCHC: 33.3 g/dL (ref 30.0–36.0)
MCV: 92.5 fL (ref 80.0–100.0)
Platelets: 272 10*3/uL (ref 150–400)
RBC: 5.36 MIL/uL (ref 4.22–5.81)
RDW: 13.1 % (ref 11.5–15.5)
WBC: 6.6 10*3/uL (ref 4.0–10.5)
nRBC: 0 % (ref 0.0–0.2)

## 2019-03-27 LAB — URINALYSIS, ROUTINE W REFLEX MICROSCOPIC
Bilirubin Urine: NEGATIVE
Glucose, UA: NEGATIVE mg/dL
Hgb urine dipstick: NEGATIVE
Ketones, ur: NEGATIVE mg/dL
Leukocytes,Ua: NEGATIVE
Nitrite: NEGATIVE
Protein, ur: NEGATIVE mg/dL
Specific Gravity, Urine: 1.016 (ref 1.005–1.030)
pH: 6 (ref 5.0–8.0)

## 2019-03-27 LAB — BASIC METABOLIC PANEL
Anion gap: 6 (ref 5–15)
BUN: 17 mg/dL (ref 6–20)
CO2: 31 mmol/L (ref 22–32)
Calcium: 9 mg/dL (ref 8.9–10.3)
Chloride: 101 mmol/L (ref 98–111)
Creatinine, Ser: 0.72 mg/dL (ref 0.61–1.24)
GFR calc Af Amer: >60 mL/min (ref >60)
GFR calc non Af Amer: >60 mL/min (ref >60)
Glucose, Bld: 101 mg/dL — ABNORMAL HIGH (ref 70–99)
Potassium: 3.7 mmol/L (ref 3.5–5.1)
Sodium: 138 mmol/L (ref 135–145)

## 2019-03-27 LAB — CBG MONITORING, ED: Glucose-Capillary: 96 mg/dL (ref 70–99)

## 2019-03-27 LAB — D-DIMER, QUANTITATIVE (NOT AT ARMC): D-Dimer, Quant: <0.27 ug/mL-FEU (ref 0.00–0.50)

## 2019-03-27 LAB — TROPONIN I (HIGH SENSITIVITY): Troponin I (High Sensitivity): 16 ng/L (ref <18)

## 2019-03-27 MED ORDER — MECLIZINE HCL 25 MG PO TABS: 25.0000 mg | ORAL_TABLET | Freq: Three times a day (TID) | ORAL | 0 refills | Status: DC | PRN

## 2019-03-27 MED ORDER — MECLIZINE HCL 12.5 MG PO TABS
25.0000 mg | ORAL_TABLET | Freq: Once | ORAL | Status: AC
  Administered 2019-03-27: 22:00:00 25 mg via ORAL
  Filled 2019-03-27: qty 2

## 2019-03-27 MED ORDER — ONDANSETRON HCL 4 MG/2ML IJ SOLN
4.0000 mg | Freq: Once | INTRAMUSCULAR | Status: AC
  Administered 2019-03-27: 22:00:00 4 mg via INTRAVENOUS
  Filled 2019-03-27: qty 2

## 2019-03-27 MED ORDER — ONDANSETRON 4 MG PO TBDP: 4.0000 mg | ORAL_TABLET | Freq: Three times a day (TID) | ORAL | 0 refills | Status: DC | PRN

## 2019-03-27 MED ORDER — SODIUM CHLORIDE 0.9 % IV BOLUS
1000.0000 mL | Freq: Once | INTRAVENOUS | Status: AC
  Administered 2019-03-27: 22:00:00 1000 mL via INTRAVENOUS

## 2019-03-27 NOTE — ED Triage Notes (Addendum)
Dizziness x 2 days.  Denies any pain.  Pt had this same problem approx 6 months ago.  Missed appointment with pcp. Ambulatory to triage.  NIH scale negative.   Pt family reports pt had some chest tightness last night.

## 2019-03-27 NOTE — Discharge Instructions (Addendum)
Your COVID-19 test was positive.  This is likely the cause of your symptoms.  This is a virus that should be treated symptomatically. Make sure stay well-hydrated with water. Use zofran as needed for nausea or vomiting.  Use meclizine as needed for dizziness. Monitor for worsening respiratory symptoms.  If you are having difficulty breathing, will need to be seen for further evaluation. Return to the emergency room with any new, worsening, concerning symptoms.

## 2019-03-28 NOTE — ED Provider Notes (Signed)
[facility] EMERGENCY DEPARTMENT Provider Note   CSN: [id number] Arrival date & time: 03/27/19  1543     History   Chief Complaint Chief Complaint  Patient presents with  . Dizziness    HPI Kevin Barron is a 53 y.o. male presenting for evaluation of dizziness and nausea.  Patient states for the past 2 days, he has been having dizziness and nausea.  He had some chest pressure last night, which is worse when he takes a deep breath in.  He states his dizziness is worse upon standing.  He has not been feeling well for the past week, had cough and low-grade fever a week ago.  He was tested for COVID, but does not know what the results were.  He reports cough and fever have since resolved.  He denies shortness of breath.  He denies sore throat, chest pain, abdominal pain, urinary symptoms, normal bowel movements.  He reports decreased appetite.  He has not been taking anything for his symptoms.  He denies sick contacts or contact with known COVID-19 positive person.  He has no other medical problems other than high cholesterol, takes no medications daily.  Additional history obtained from chart review.  Patient's COVID test was positive on 03/18/2019.      HPI  Past Medical History:  Diagnosis Date  . High cholesterol   . Murmur   . Obesity     Patient Active Problem List   Diagnosis Date Noted  . Near syncope 03/24/2014  . Hypotension 03/24/2014  . Aortic stenosis 05/24/2013  . Chest pain 10/26/2012  . Murmur 10/26/2012    Past Surgical History:  Procedure Laterality Date  . LEG SURGERY Left         Home Medications    Prior to Admission medications   Medication Sig Start Date End Date Taking? Authorizing Provider  ketoconazole (NIZORAL) 2 % cream Apply to affected area one daily for two weeks.   Aplique a la area [employer] ves diaria por [employer]. 09/09/17  Yes Soyla Dryer, PA-C  simvastatin (ZOCOR) 20 MG tablet 1 po qhs for cholesterol.  Tome  una tableta por boca al dormir para el colesterol 11/11/17  Yes Soyla Dryer, PA-C  meclizine (ANTIVERT) 25 MG tablet Take 1 tablet (25 mg total) by mouth 3 (three) times daily as needed for dizziness. 03/27/19   Temperance Kelemen, PA-C  ondansetron (ZOFRAN ODT) 4 MG disintegrating tablet Take 1 tablet (4 mg total) by mouth every 8 (eight) hours as needed for nausea or vomiting. 03/27/19   Adeline Petitfrere, PA-C    Family History Family History  Problem Relation Age of Onset  . Asthma Mother   . Diabetes Father   . Hypertension Father     Social History Social History   Tobacco Use  . Smoking status: Former Smoker    Packs/day: 1.00    Years: 14.00    Pack years: 14.00    Types: Cigarettes    Quit date: 07/08/1993    Years since quitting: 25.7  . Smokeless tobacco: Never Used  Substance Use Topics  . Alcohol use: No    Comment: Quit drinking 2007. Pt would drink beer socailly  . Drug use: No     Allergies   Patient has no known allergies.   Review of Systems Review of Systems  Constitutional: Positive for appetite change and fever (resolved).  Respiratory: Positive for chest tightness.   Gastrointestinal: Positive for nausea.  Neurological: Positive for dizziness.  All  other systems reviewed and are negative.    Physical Exam Updated Vital Signs BP 140/83 (BP Location: Right Arm)   Pulse 65   Temp 98.6 F (37 C) (Oral)   Resp 18   Ht 5\' 2"  (1.575 m)   Wt 89.8 kg   SpO2 96%   BMI 36.21 kg/m   Physical Exam Vitals signs and nursing note reviewed.  Constitutional:      General: He is not in acute distress.    Appearance: He is well-developed.     Comments: Appears nontoxic  HENT:     Head: Normocephalic and atraumatic.  Eyes:     Conjunctiva/sclera: Conjunctivae normal.     Pupils: Pupils are equal, round, and reactive to light.  Neck:     Musculoskeletal: Normal range of motion and neck supple.  Cardiovascular:     Rate and Rhythm: Normal rate and  regular rhythm.     Pulses: Normal pulses.  Pulmonary:     Effort: Pulmonary effort is normal. No respiratory distress.     Breath sounds: Normal breath sounds. No wheezing.     Comments: Speaking in full sentences.  Clear lung sounds in all fields. Abdominal:     General: There is no distension.     Palpations: Abdomen is soft. There is no mass.     Tenderness: There is no abdominal tenderness. There is no guarding or rebound.     Comments: No tenderness palpation of the abdomen.  Musculoskeletal: Normal range of motion.     Comments: Strength and sensation intact x4.  No lower extremity swelling or edema  Skin:    General: Skin is warm and dry.     Capillary Refill: Capillary refill takes less than 2 seconds.  Neurological:     General: No focal deficit present.     Mental Status: He is alert and oriented to person, place, and time.     GCS: GCS eye subscore is 4. GCS verbal subscore is 5. GCS motor subscore is 6.     Cranial Nerves: Cranial nerves are intact.     Sensory: Sensation is intact.     Motor: Motor function is intact.      ED Treatments / Results  Labs (all labs ordered are listed, but only abnormal results are displayed) Labs Reviewed  BASIC METABOLIC PANEL - Abnormal; Notable for the following components:      Result Value   Glucose, Bld 101 (*)    All other components within normal limits  CBC  URINALYSIS, ROUTINE W REFLEX MICROSCOPIC  D-DIMER, QUANTITATIVE (NOT AT [facility])  CBG MONITORING, ED  TROPONIN I (HIGH SENSITIVITY)    EKG EKG Interpretation  Date/Time:  Saturday March 27 2019 16:11:30 EDT Ventricular Rate:  67 PR Interval:  170 QRS Duration: 90 QT Interval:  406 QTC Calculation: 429 R Axis:   -35 Text Interpretation:  Normal sinus rhythm Left axis deviation Minimal voltage criteria for LVH, may be normal variant ST & T wave abnormality, consider lateral ischemia Abnormal ECG Since last tracing subtle lateral ST/T wave abnormality is new  Confirmed by Daleen Bo [phone]) on 03/27/2019 7:53:56 PM   Radiology Dg Chest 2 View  Result Date: 03/27/2019 CLINICAL DATA:  Chest tightness EXAM: CHEST - 2 VIEW COMPARISON:  03/23/2014 FINDINGS: The heart size and mediastinal contours are within normal limits. Diffuse bilateral interstitial pulmonary opacity. The visualized skeletal structures are unremarkable. IMPRESSION: Diffuse bilateral interstitial pulmonary opacity, consistent with edema or infection. No focal airspace  opacity. Electronically Signed   By: Eddie Candle M.D.   On: 03/27/2019 17:12    Procedures Procedures (including critical care time)  Medications Ordered in ED Medications  sodium chloride 0.9 % bolus 1,000 mL (0 mLs Intravenous Stopped 03/27/19 2313)  ondansetron (ZOFRAN) injection 4 mg (4 mg Intravenous Given 03/27/19 2204)  meclizine (ANTIVERT) tablet 25 mg (25 mg Oral Given 03/27/19 2204)     Initial Impression / Assessment and Plan / ED Course  I have reviewed the triage vital signs and the nursing notes.  Pertinent labs & imaging results that were available during my care of the patient were reviewed by me and considered in my medical decision making (see chart for details).        Patient presenting for evaluation of dizziness and nausea.  Physical exam reassuring, he appears nontoxic.  No focal neurologic deficits, low suspicion for stroke.  Dems likely related to COVID viral illness.  However, will obtain basic labs.  We will also obtain dimer to ensure no blood clot, as this is a higher incidence and COVID.  EKG shows changes in ST since last ekg. As such, will obtain trop, although doubt ACS at this time. Fluids, zofran, and meclizine for sx control.   Labs reassuring.  Electrolytes stable. Dimer negative, doubt PE.  Chest x-ray viewed interpreted by me, consistent with pneumonia.  As he has a positive COVID test, no fever, and no leukocytosis, doubt bacterial pneumonia, likely viral pneumonia.  As  such, will hold off on antibiotic treatment at this time.   On reassessment, patient reports dizziness is improved.  Discussed symptoms are likely due to viral illness, to be treated symptomatically.  As patient is 10 days out from initial symptoms, and he has been fever free for 3 of them, he does not need to quarantine further per CDC guidelines.  I discussed that patient's contacts will need to monitor for symptoms, and if they develop symptoms they will need to stay home with presumed COVID.  At this time, patient appears safe for discharge.  Return precautions given.  Patient states he understands and agrees to plan.  Final Clinical Impressions(s) / ED Diagnoses   Final diagnoses:  COVID-19  Dizziness  Dehydration    ED Discharge Orders         Ordered    ondansetron (ZOFRAN ODT) 4 MG disintegrating tablet  Every 8 hours PRN     03/27/19 2239    meclizine (ANTIVERT) 25 MG tablet  3 times daily PRN     03/27/19 2239           Franchot Heidelberg, PA-C 03/28/19 0100    Daleen Bo, MD 03/30/19 1339

## 2019-04-06 ENCOUNTER — Telehealth: Payer: Self-pay

## 2019-04-06 NOTE — Telephone Encounter (Signed)
Pt. Eligibility is 04/06/2019 till 04/05/2020 with [employer] [facility]. Did not fax over [employer] application because pt. Did give any proof of income verification. Will give pt. A self-employment form to fill out.  Drema Halon

## 2019-04-12 ENCOUNTER — Ambulatory Visit: Payer: Self-pay | Admitting: Physician Assistant

## 2019-04-21 ENCOUNTER — Encounter: Payer: Self-pay | Admitting: Physician Assistant

## 2019-04-21 ENCOUNTER — Ambulatory Visit: Payer: Self-pay | Admitting: Physician Assistant

## 2019-04-21 DIAGNOSIS — Z789 Other specified health status: Secondary | ICD-10-CM

## 2019-04-21 DIAGNOSIS — R7303 Prediabetes: Secondary | ICD-10-CM

## 2019-04-21 DIAGNOSIS — Z125 Encounter for screening for malignant neoplasm of prostate: Secondary | ICD-10-CM

## 2019-04-21 DIAGNOSIS — I350 Nonrheumatic aortic (valve) stenosis: Secondary | ICD-10-CM

## 2019-04-21 DIAGNOSIS — Z7689 Persons encountering health services in other specified circumstances: Secondary | ICD-10-CM

## 2019-04-21 DIAGNOSIS — E785 Hyperlipidemia, unspecified: Secondary | ICD-10-CM

## 2019-04-21 NOTE — Progress Notes (Signed)
   There were no vitals taken for this visit.   Subjective:    Patient ID: Kevin Barron, male    DOB: 1965/12/30, 53 y.o.   MRN: [id number]  HPI: Kevin Barron is a 53 y.o. male presenting on 04/21/2019 for New Patient (Initial Visit) (previous [facility] pt. pt states he has not has another PCP since being here.)   HPI   This is a telemedicine appointment through [employer] due to coronavirus pandemic  I connected with  Kevin Barron on 04/21/19 by a video enabled telemedicine application and verified that I am speaking with the correct person using two identifiers.   I discussed the limitations of evaluation and management by telemedicine. The patient expressed understanding and agreed to proceed.  Pt is at home.  Provider is at office.     Pt is a returning patient.  Patient has not been seen since may 2019.  He was diagnosed with covid on 03/18/19 but is feeling well now.    He doesn't have a steady job now  He is not exercising regularly but he says he is active.   Patient has no complaints today.    Relevant past medical, surgical, family and social history reviewed and updated as indicated. Interim medical history since our last visit reviewed. Allergies and medications reviewed and updated.  No current outpatient medications on file.     Review of Systems  Per HPI unless specifically indicated above     Objective:    There were no vitals taken for this visit.  Wt Readings from Last 3 Encounters:  03/27/19 198 lb (89.8 kg)  11/11/17 212 lb 8 oz (96.4 kg)  09/09/17 210 lb 8 oz (95.5 kg)    Physical Exam Constitutional:      General: He is not in acute distress.    Appearance: He is not ill-appearing.  HENT:     Head: Normocephalic and atraumatic.  Pulmonary:     Effort: No respiratory distress.  Neurological:     Mental Status: He is alert and oriented to person, place, and time.  Psychiatric:        Attention and Perception:  Attention normal.        Speech: Speech normal.        Behavior: Behavior is cooperative.           Assessment & Plan:    Encounter Diagnoses  Name Primary?  . Encounter to establish care Yes  . Hyperlipidemia, unspecified hyperlipidemia type   . Prediabetes   . Screening for prostate cancer   . Aortic valve stenosis, etiology of cardiac valve disease unspecified   . Non-English speaking patient      -Will update fasting labs -He already got influenza vaccination this season -Patient to follow-up in 1 month in office-will review labs check bp.  Patient is to contact office sooner if needed

## 2019-05-20 ENCOUNTER — Other Ambulatory Visit (HOSPITAL_COMMUNITY)
Admission: RE | Admit: 2019-05-20 | Discharge: 2019-05-20 | Disposition: A | Discharge status: Home / Self Care | Payer: Self-pay | Source: Ambulatory Visit | Attending: Physician Assistant | Admitting: Physician Assistant

## 2019-05-20 DIAGNOSIS — Z125 Encounter for screening for malignant neoplasm of prostate: Secondary | ICD-10-CM | POA: Insufficient documentation

## 2019-05-20 DIAGNOSIS — R7303 Prediabetes: Secondary | ICD-10-CM | POA: Insufficient documentation

## 2019-05-20 DIAGNOSIS — E785 Hyperlipidemia, unspecified: Secondary | ICD-10-CM | POA: Insufficient documentation

## 2019-05-20 LAB — COMPREHENSIVE METABOLIC PANEL
ALT: 47 U/L — ABNORMAL HIGH (ref 0–44)
AST: 34 U/L (ref 15–41)
Albumin: 3.9 g/dL (ref 3.5–5.0)
Alkaline Phosphatase: 46 U/L (ref 38–126)
Anion gap: 9 (ref 5–15)
BUN: 22 mg/dL — ABNORMAL HIGH (ref 6–20)
CO2: 27 mmol/L (ref 22–32)
Calcium: 8.9 mg/dL (ref 8.9–10.3)
Chloride: 103 mmol/L (ref 98–111)
Creatinine, Ser: 0.88 mg/dL (ref 0.61–1.24)
GFR calc Af Amer: >60 mL/min (ref >60)
GFR calc non Af Amer: >60 mL/min (ref >60)
Glucose, Bld: 124 mg/dL — ABNORMAL HIGH (ref 70–99)
Potassium: 4.3 mmol/L (ref 3.5–5.1)
Sodium: 139 mmol/L (ref 135–145)
Total Bilirubin: 0.6 mg/dL (ref 0.3–1.2)
Total Protein: 7.1 g/dL (ref 6.5–8.1)

## 2019-05-20 LAB — HEMOGLOBIN A1C
Hgb A1c MFr Bld: 6.3 % — ABNORMAL HIGH (ref 4.8–5.6)
Mean Plasma Glucose: 134.11 mg/dL

## 2019-05-20 LAB — LIPID PANEL
Cholesterol: 229 mg/dL — ABNORMAL HIGH (ref 0–200)
HDL: 37 mg/dL — ABNORMAL LOW (ref >40)
LDL Cholesterol: 170 mg/dL — ABNORMAL HIGH (ref 0–99)
Total CHOL/HDL Ratio: 6.2 RATIO
Triglycerides: 110 mg/dL (ref <150)
VLDL: 22 mg/dL (ref 0–40)

## 2019-05-20 LAB — PSA: Prostatic Specific Antigen: 1.01 ng/mL (ref 0.00–4.00)

## 2019-05-25 ENCOUNTER — Encounter: Payer: Self-pay | Admitting: Physician Assistant

## 2019-05-25 ENCOUNTER — Ambulatory Visit: Payer: Self-pay | Admitting: Physician Assistant

## 2019-05-25 DIAGNOSIS — R7303 Prediabetes: Secondary | ICD-10-CM

## 2019-05-25 DIAGNOSIS — Z1211 Encounter for screening for malignant neoplasm of colon: Secondary | ICD-10-CM

## 2019-05-25 DIAGNOSIS — I350 Nonrheumatic aortic (valve) stenosis: Secondary | ICD-10-CM

## 2019-05-25 DIAGNOSIS — E785 Hyperlipidemia, unspecified: Secondary | ICD-10-CM

## 2019-05-25 DIAGNOSIS — Z789 Other specified health status: Secondary | ICD-10-CM

## 2019-05-25 NOTE — Patient Instructions (Signed)
Colesterol elevado High Cholesterol  El colesterol elevado es una afeccin en la que la sangre tiene niveles altos de una sustancia blanca, cerosa y parecida a la grasa (colesterol). El organismo humano necesita una pequea cantidad de colesterol. El hgado fabrica todo el colesterol que el organismo necesita. El exceso de colesterol proviene de los alimentos que comemos. La sangre transporta el colesterol desde el hgado a travs de los vasos sanguneos. Si tiene el colesterol elevado, este puede depositarse (formar placas) en las paredes de los vasos sanguneos (arterias). Las placas provocan el estrechamiento y la rigidez de las arterias. Las placas de colesterol aumentan el riesgo de sufrir un infarto de miocardio y un accidente cerebrovascular. Trabaje con el mdico para mantener las concentraciones de colesterol en un rango saludable. Qu incrementa el riesgo? Es ms probable que esta afeccin se manifieste en las personas que:  Consumen alimentos con alto contenido de grasa animal (grasa saturada) o colesterol.  Tienen sobrepeso.  No hacen suficiente ejercicio fsico.  Tienen antecedentes familiares de colesterol elevado. Cules son los signos o los sntomas? Esta afeccin no presenta sntomas. Cmo se diagnostica? Esta afeccin podra diagnosticarse a partir de los resultados de anlisis de sangre.  Si es mayor de 20aos, es posible que el mdico le controle el colesterol cada 4a6aos.  Los controles pueden ser ms frecuentes si ya tuvo el colesterol elevado u otros factores de riesgo de enfermedades cardacas. En el anlisis de sangre de colesterol, se determina lo siguiente:  El colesterol "malo" (colesterol LDL). Este es el principal tipo de colesterol que causa enfermedades cardacas. El nivel recomendado de LDL es de menos de100.  El colesterol "bueno" (colesterol HDL). Este tipo ayuda a proteger contra las enfermedades cardacas limpiando las arterias y arrastrando el  LDL. El nivel recomendado de HDL es de60 o superior.  Triglicridos. Estos son grasas que el organismo puede almacenar o quemar como fuente de energa. El nivel recomendado de triglicridos es de menos de 150.  Colesterol total. Esta es una medicin de la cantidad total de colesterol en la sangre, que incluye el colesterol LDL, el colesterol HDL y los triglicridos. El valor saludable es de menos de200. Cmo se trata? Esta afeccin se trata con cambios en la dieta y en el estilo de vida, y con medicamentos. Cambios en la dieta  Entre ellos, la ingesta de una mayor cantidad de cereales integrales, frutas, verduras, frutos secos y pescado.  Tambin podran incluir la reduccin del consumo de carnes rojas y alimentos con mucho azcar agregado. Cambios en el estilo de vida  Entre ellos, realizar sesiones de ejercicios aerbicos durante, por lo menos, 40minutos, 3veces por semana. Por ejemplo, caminar, andar en bicicleta y nadar. Los ejercicios aerbicos junto con una dieta sana pueden ayudar a que se mantenga en un peso saludable.  Los cambios tambin podran incluir dejar de fumar. Medicamentos  Por lo general, se administran medicamentos si con los cambios en la alimentacin y en el estilo de vida no se logra reducir el colesterol hasta niveles saludables.  El mdico podra recetarle estatinas. Se ha demostrado que las estatinas disminuyen los niveles de colesterol, lo que puede reducir el riesgo de padecer una enfermedad cardaca. Siga estas indicaciones en su casa: Comida y bebida Si se lo indic el mdico:  Coma pollo (sin piel), pescado, ternera, mariscos, pechuga de pavo molida y cortes de carne roja de pulpa o de lomo.  No coma alimentos fritos ni carnes grasosas, como salchichas y salame.  Coma muchas   frutas, como manzanas.  Coma gran cantidad de verduras, como brcoli, papas y zanahorias.  Coma porotos, guisantes secos y lentejas.  Coma cereales, como cebada, arroz,  cuscs y trigo burgol.  Coma pastas sin salsas con crema.  Tome leche descremada o semidescremada, y coma yogures y quesos descremados o semidescremados.  No coma ni beba leche entera, crema, helado, yemas de huevo ni quesos duros.  No coma margarinas en barra ni untables que contengan grasas trans (que tambin se conocen como aceites parcialmente hidrogenados).  No coma aceites tropicales saturados, como el de coco y el de palma.  No coma tortas, galletas, galletitas ni otros productos horneados que contengan grasas trans.  Instrucciones generales  Haga ejercicio segn las indicaciones del mdico. Aumente la cantidad de ejercicio fsico que realiza mediante actividades como jardinera, salir a caminar o usar las escaleras.  Tome los medicamentos de venta libre y los recetados solamente como se lo haya indicado el mdico.  No consuma ningn producto que contenga nicotina o tabaco, como cigarrillos y cigarrillos electrnicos. Si necesita ayuda para dejar de fumar, consulte al mdico.  Concurra a todas las visitas de control como se lo haya indicado el mdico. Esto es importante. Comunquese con un mdico si:  Tiene dificultad para seguir una dieta sana o mantener un peso saludable.  Necesita ayuda para comenzar un programa de ejercicios.  Necesita ayuda para dejar de fumar. Solicite ayuda de inmediato si:  Siente dolor en el pecho.  Tiene dificultad para respirar. Esta informacin no tiene como fin reemplazar el consejo del mdico. Asegrese de hacerle al mdico cualquier pregunta que tenga. Document Released: 06/24/2005 Document Revised: 09/30/2016 Document Reviewed: 12/23/2015 Elsevier Patient Education  2020 Elsevier Inc.  

## 2019-05-25 NOTE — Progress Notes (Signed)
There were no vitals taken for this visit.   Subjective:    Patient ID: Kevin Barron, male    DOB: 1965-12-05, 53 y.o.   MRN: [id number]  HPI: Kevin Barron is a 53 y.o. male presenting on 05/25/2019 for Follow-up   HPI  This is a telemedicine appointment through [employer] due to coronavirus pandemic.  I connected with  Abeer Hernandez-Garcia on 05/25/19 by a video enabled telemedicine application and verified that I am speaking with the correct person using two identifiers.   I discussed the limitations of evaluation and management by telemedicine. The patient expressed understanding and agreed to proceed.    Pt says he is having no lingering effect from recent covid infection.  He is working- doing odd jobs/handywork  He says he is having No cp or sob     Relevant past medical, surgical, family and social history reviewed and updated as indicated. Interim medical history since our last visit reviewed. Allergies and medications reviewed and updated.   No current outpatient medications on file.   Review of Systems  Per HPI unless specifically indicated above     Objective:    There were no vitals taken for this visit.  Wt Readings from Last 3 Encounters:  03/27/19 198 lb (89.8 kg)  11/11/17 212 lb 8 oz (96.4 kg)  09/09/17 210 lb 8 oz (95.5 kg)    Physical Exam Constitutional:      General: He is not in acute distress.    Appearance: Normal appearance. He is not ill-appearing.  HENT:     Head: Normocephalic and atraumatic.  Pulmonary:     Effort: Pulmonary effort is normal. No respiratory distress.  Neurological:     Mental Status: He is alert and oriented to person, place, and time.  Psychiatric:        Attention and Perception: Attention normal.        Speech: Speech normal.        Behavior: Behavior is cooperative.     Results for orders placed or performed during the hospital encounter of 05/20/19  Hemoglobin A1c  Result  Value Ref Range   Hgb A1c MFr Bld 6.3 (H) 4.8 - 5.6 %   Mean Plasma Glucose 134.11 mg/dL  PSA  Result Value Ref Range   Prostatic Specific Antigen 1.01 0.00 - 4.00 ng/mL  Lipid panel  Result Value Ref Range   Cholesterol 229 (H) 0 - 200 mg/dL   Triglycerides 110 <150 mg/dL   HDL 37 (L) >40 mg/dL   Total CHOL/HDL Ratio 6.2 RATIO   VLDL 22 0 - 40 mg/dL   LDL Cholesterol 170 (H) 0 - 99 mg/dL  Comprehensive metabolic panel  Result Value Ref Range   Sodium 139 135 - 145 mmol/L   Potassium 4.3 3.5 - 5.1 mmol/L   Chloride 103 98 - 111 mmol/L   CO2 27 22 - 32 mmol/L   Glucose, Bld 124 (H) 70 - 99 mg/dL   BUN 22 (H) 6 - 20 mg/dL   Creatinine, Ser 0.88 0.61 - 1.24 mg/dL   Calcium 8.9 8.9 - 10.3 mg/dL   Total Protein 7.1 6.5 - 8.1 g/dL   Albumin 3.9 3.5 - 5.0 g/dL   AST 34 15 - 41 U/L   ALT 47 (H) 0 - 44 U/L   Alkaline Phosphatase 46 38 - 126 U/L   Total Bilirubin 0.6 0.3 - 1.2 mg/dL   GFR calc non Af Amer >60 >60 mL/min   GFR calc  Af Amer >60 >60 mL/min   Anion gap 9 5 - 15      Assessment & Plan:    Encounter Diagnoses  Name Primary?  . Hyperlipidemia, unspecified hyperlipidemia type Yes  . Prediabetes   . Aortic valve stenosis, etiology of cardiac valve disease unspecified   . Screening for colon cancer   . Non-English speaking patient      -reviewed labs with pt -pt counseled to Watch diet and encouraged to Exercise regularly -Discussed updating echo-   Last done 03/24/14- pt wants to wait; he does not want to get the echo done at this time   -Pt to pick up ifobt test for colon cancer screening -counseled pt on prediabetes -discussed starting stating for lipids but pt wants to try lowing cholesterol with diet and exercise.  Pt is counseled on low fat diet and given reading information.  Will recheckc lipids 4 months.  Pt to contact office sooner prn

## 2019-06-03 ENCOUNTER — Other Ambulatory Visit: Payer: Self-pay | Admitting: Physician Assistant

## 2019-06-03 DIAGNOSIS — Z1211 Encounter for screening for malignant neoplasm of colon: Secondary | ICD-10-CM

## 2019-09-17 ENCOUNTER — Other Ambulatory Visit (HOSPITAL_COMMUNITY)
Admission: RE | Admit: 2019-09-17 | Discharge: 2019-09-17 | Disposition: A | Discharge status: Home / Self Care | Payer: Self-pay | Source: Ambulatory Visit | Attending: Physician Assistant | Admitting: Physician Assistant

## 2019-09-17 ENCOUNTER — Other Ambulatory Visit: Payer: Self-pay

## 2019-09-17 DIAGNOSIS — E785 Hyperlipidemia, unspecified: Secondary | ICD-10-CM | POA: Insufficient documentation

## 2019-09-17 LAB — COMPREHENSIVE METABOLIC PANEL
ALT: 25 U/L (ref 0–44)
AST: 22 U/L (ref 15–41)
Albumin: 4.1 g/dL (ref 3.5–5.0)
Alkaline Phosphatase: 45 U/L (ref 38–126)
Anion gap: 8 (ref 5–15)
BUN: 19 mg/dL (ref 6–20)
CO2: 25 mmol/L (ref 22–32)
Calcium: 8.7 mg/dL — ABNORMAL LOW (ref 8.9–10.3)
Chloride: 104 mmol/L (ref 98–111)
Creatinine, Ser: 0.7 mg/dL (ref 0.61–1.24)
GFR calc Af Amer: >60 mL/min (ref >60)
GFR calc non Af Amer: >60 mL/min (ref >60)
Glucose, Bld: 124 mg/dL — ABNORMAL HIGH (ref 70–99)
Potassium: 3.9 mmol/L (ref 3.5–5.1)
Sodium: 137 mmol/L (ref 135–145)
Total Bilirubin: 0.8 mg/dL (ref 0.3–1.2)
Total Protein: 7.6 g/dL (ref 6.5–8.1)

## 2019-09-17 LAB — LIPID PANEL
Cholesterol: 222 mg/dL — ABNORMAL HIGH (ref 0–200)
HDL: 40 mg/dL — ABNORMAL LOW (ref >40)
LDL Cholesterol: 167 mg/dL — ABNORMAL HIGH (ref 0–99)
Total CHOL/HDL Ratio: 5.6 RATIO
Triglycerides: 76 mg/dL (ref <150)
VLDL: 15 mg/dL (ref 0–40)

## 2019-09-20 ENCOUNTER — Ambulatory Visit: Payer: Self-pay | Admitting: Physician Assistant

## 2019-09-28 ENCOUNTER — Encounter: Payer: Self-pay | Admitting: Physician Assistant

## 2019-09-28 ENCOUNTER — Ambulatory Visit: Payer: Self-pay | Admitting: Physician Assistant

## 2019-09-28 DIAGNOSIS — E785 Hyperlipidemia, unspecified: Secondary | ICD-10-CM

## 2019-09-28 DIAGNOSIS — I350 Nonrheumatic aortic (valve) stenosis: Secondary | ICD-10-CM

## 2019-09-28 DIAGNOSIS — Z1211 Encounter for screening for malignant neoplasm of colon: Secondary | ICD-10-CM

## 2019-09-28 DIAGNOSIS — Z789 Other specified health status: Secondary | ICD-10-CM

## 2019-09-28 DIAGNOSIS — R7303 Prediabetes: Secondary | ICD-10-CM

## 2019-09-28 MED ORDER — SIMVASTATIN 20 MG PO TABS: 20.0000 mg | ORAL_TABLET | Freq: Every day | ORAL | 4 refills | Status: DC

## 2019-09-28 NOTE — Patient Instructions (Signed)
Colesterol elevado High Cholesterol  El colesterol elevado es una afeccin en la que la sangre tiene niveles altos de una sustancia blanca, cerosa y parecida a la grasa (colesterol). El organismo humano necesita una pequea cantidad de colesterol. El hgado fabrica todo el colesterol que el organismo necesita. El exceso de colesterol proviene de los alimentos que comemos. La sangre transporta el colesterol desde el hgado a travs de los vasos sanguneos. Si tiene el colesterol elevado, este puede depositarse (formar placas) en las paredes de los vasos sanguneos (arterias). Las placas provocan el estrechamiento y la rigidez de las arterias. Las placas de colesterol aumentan el riesgo de sufrir un infarto de miocardio y un accidente cerebrovascular. Trabaje con el mdico para mantener las concentraciones de colesterol en un rango saludable. Qu incrementa el riesgo? Es ms probable que esta afeccin se manifieste en las personas que:  Consumen alimentos con alto contenido de grasa animal (grasa saturada) o colesterol.  Tienen sobrepeso.  No hacen suficiente ejercicio fsico.  Tienen antecedentes familiares de colesterol elevado. Cules son los signos o los sntomas? Esta afeccin no presenta sntomas. Cmo se diagnostica? Esta afeccin podra diagnosticarse a partir de los resultados de anlisis de sangre.  Si es mayor de 20aos, es posible que el mdico le controle el colesterol cada 4a6aos.  Los controles pueden ser ms frecuentes si ya tuvo el colesterol elevado u otros factores de riesgo de enfermedades cardacas. En el anlisis de sangre de colesterol, se determina lo siguiente:  El colesterol "malo" (colesterol LDL). Este es el principal tipo de colesterol que causa enfermedades cardacas. El nivel recomendado de LDL es de menos de100.  El colesterol "bueno" (colesterol HDL). Este tipo ayuda a proteger contra las enfermedades cardacas limpiando las arterias y arrastrando el  LDL. El nivel recomendado de HDL es de60 o superior.  Triglicridos. Estos son grasas que el organismo puede almacenar o quemar como fuente de energa. El nivel recomendado de triglicridos es de menos de 150.  Colesterol total. Esta es una medicin de la cantidad total de colesterol en la sangre, que incluye el colesterol LDL, el colesterol HDL y los triglicridos. El valor saludable es de menos de200. Cmo se trata? Esta afeccin se trata con cambios en la dieta y en el estilo de vida, y con medicamentos. Cambios en la dieta  Entre ellos, la ingesta de una mayor cantidad de cereales integrales, frutas, verduras, frutos secos y pescado.  Tambin podran incluir la reduccin del consumo de carnes rojas y alimentos con mucho azcar agregado. Cambios en el estilo de vida  Entre ellos, realizar sesiones de ejercicios aerbicos durante, por lo menos, 40minutos, 3veces por semana. Por ejemplo, caminar, andar en bicicleta y nadar. Los ejercicios aerbicos junto con una dieta sana pueden ayudar a que se mantenga en un peso saludable.  Los cambios tambin podran incluir dejar de fumar. Medicamentos  Por lo general, se administran medicamentos si con los cambios en la alimentacin y en el estilo de vida no se logra reducir el colesterol hasta niveles saludables.  El mdico podra recetarle estatinas. Se ha demostrado que las estatinas disminuyen los niveles de colesterol, lo que puede reducir el riesgo de padecer una enfermedad cardaca. Siga estas indicaciones en su casa: Comida y bebida Si se lo indic el mdico:  Coma pollo (sin piel), pescado, ternera, mariscos, pechuga de pavo molida y cortes de carne roja de pulpa o de lomo.  No coma alimentos fritos ni carnes grasosas, como salchichas y salame.  Coma muchas   frutas, como manzanas.  Coma gran cantidad de verduras, como brcoli, papas y zanahorias.  Coma porotos, guisantes secos y lentejas.  Coma cereales, como cebada, arroz,  cuscs y trigo burgol.  Coma pastas sin salsas con crema.  Tome leche descremada o semidescremada, y coma yogures y quesos descremados o semidescremados.  No coma ni beba leche entera, crema, helado, yemas de huevo ni quesos duros.  No coma margarinas en barra ni untables que contengan grasas trans (que tambin se conocen como aceites parcialmente hidrogenados).  No coma aceites tropicales saturados, como el de coco y el de palma.  No coma tortas, galletas, galletitas ni otros productos horneados que contengan grasas trans.  Instrucciones generales  Haga ejercicio segn las indicaciones del mdico. Aumente la cantidad de ejercicio fsico que realiza mediante actividades como jardinera, salir a caminar o usar las escaleras.  Tome los medicamentos de venta libre y los recetados solamente como se lo haya indicado el mdico.  No consuma ningn producto que contenga nicotina o tabaco, como cigarrillos y cigarrillos electrnicos. Si necesita ayuda para dejar de fumar, consulte al mdico.  Concurra a todas las visitas de control como se lo haya indicado el mdico. Esto es importante. Comunquese con un mdico si:  Tiene dificultad para seguir una dieta sana o mantener un peso saludable.  Necesita ayuda para comenzar un programa de ejercicios.  Necesita ayuda para dejar de fumar. Solicite ayuda de inmediato si:  Siente dolor en el pecho.  Tiene dificultad para respirar. Esta informacin no tiene como fin reemplazar el consejo del mdico. Asegrese de hacerle al mdico cualquier pregunta que tenga. Document Revised: 09/30/2016 Document Reviewed: 12/23/2015 Elsevier Patient Education  2020 Elsevier Inc.   ------------------------------------------   Prediabetes Prediabetes La prediabetes es la afeccin de tener un nivel de azcar en la sangre (glucemia ms alto de lo normal, aunque no lo suficientemente alto para recibir un diagnstico de diabetes tipo2. El hecho de ser  prediabtico lo pone en riesgo de desarrollar diabetes tipo 2 (diabetes mellitus tipo2). La prediabetes puede denominarse intolerancia a la glucosa o alteracin de la glucosa en ayunas. Generalmente, la prediabetes no causa sntomas. El mdico puede diagnosticar esta afeccin por los anlisis de sangre. Es posible que le realicen un anlisis para determinar si tiene prediabetes si tiene sobrepeso y al menos algn otro factor de riesgo de prediabetes. Qu es la glucemia y cmo se mide? La glucemia es la cantidad de glucosa presente en el torrente sanguneo. La glucosa proviene de los alimentos que ingiere que contienen azcares y almidones (hidratos de carbono), que el cuerpo descompone en glucosa. El nivel de glucemia se puede medir en mg/dl (miligramos por decilitro) o mmol/l (milimoles por litro). La glucemia puede comprobarse con uno o ms de los siguientes anlisis de sangre:  Prueba de la glucemia en ayunas. No se le permitir comer (tendr que hacer ayuno) durante 8horas o ms antes de que se le tome una muestra de sangre. ? El rango normal de glucemia en ayunas es de 70 a 100mg/dl (3,9 a 5,6mmol/l).  Una prueba de sangre de A1c (hemoglobina A1c). Esta prueba proporciona informacin sobre el control de la glucemia durante los ltimos 2 o 3meses.  Prueba de tolerancia a la glucosa oral (PTGO). Esta prueba mide la glucemia en dos momentos: ? Despus de ayunar. Este es el valor inicial. ? Dos horas despus de tomar una bebida que contiene glucosa. Pueden diagnosticarle prediabetes en los siguientes casos:  Si su glucemia en ayunas es de   100 a 125 mg/dl (5,6 a 6,9 mmol/l).  Si su nivel de A1c es de 5,7 a 6,4%.  Si su resultado de la PTGO es de 140 a 199mg/dl (7,8 a 11mmol/l). Estas pruebas de sangre se pueden repetir para confirmar el diagnstico. Cmo puede afectarme esta enfermedad? El pncreas produce una hormona (insulina) que ayuda a mover la glucosa del torrente sanguneo al  interior de las clulas. Cuando las clulas del cuerpo no responden adecuadamente a la insulina que el cuerpo produce (resistencia a la insulina), se acumula exceso de glucosa en la sangre en lugar de entrar en las clulas. Como resultado, se puede producir un nivel alto de glucemia (hiperglucemia), lo cual puede causar muchas complicaciones. La hiperglucemia es un sntoma de prediabetes. Tener la glucemia alta durante mucho tiempo es peligroso. Demasiada glucosa en la sangre puede daar los nervios y los vasos sanguneos. El dao prolongado puede derivar en complicaciones de la diabetes, que pueden incluir:  Enfermedad cardaca.  Accidente cerebrovascular.  Ceguera.  Enfermedad renal.  Depresin.  Circulacin deficiente en los pies y las piernas, lo cual podra ocasionar una extirpacin quirrgica (amputacin) en casos graves. Qu puede aumentar el riesgo? Algunos de los factores de riesgo de prediabetes son los siguientes:  Tener un familiar con diabetes tipo2.  Tener exceso de peso u obesidad.  Ser mayor de 45aos de edad.  Ser descendiente de indgenas norteamericanos, afroamericanos, hispanos o latinos, o asiticos o isleos del [place].  Tener un estilo de vida inactivo (sedentario).  Tener antecedentes de enfermedades cardacas.  En las mujeres, tener antecedentes de diabetes gestacional o sndrome del ovario poliqustico (SOP).  Tener niveles bajos del colesterol bueno (HDL-C) o niveles altos de grasas en la sangre (triglicridos).  Tener presin arterial alta. Qu puedo hacer para prevenir la diabetes?      Haga actividad fsica. ? Haga actividad fsica de intensidad moderada durante 30minutos o ms 5das por semana o con la frecuencia que le indique su mdico. Esto podra incluir caminatas dinmicas, ciclismo o gimnasia acutica. ? Pregntele al mdico qu actividades son seguras para usted. Una combinacin de actividades puede ser la mejor opcin, por  ejemplo, caminar, practicar natacin, andar en bicicleta y hacer entrenamiento de fuerza.  Baje de peso como se lo haya indicado el mdico. ? Bajar entre el 5% y el 7% del peso corporal puede revertir la resistencia a la insulina. ? El mdico puede determinar cunto peso tiene que perder y ayudarlo a que adelgace de manera segura.  Siga un plan de alimentacin saludable. Este incluye consumir protenas magras, hidratos de carbono complejos, frutas y verduras frescas, productos lcteos con bajo contenido de grasa y grasas saludables. ? Siga las indicaciones del mdico respecto de las restricciones de comidas o bebidas. ? Programe una cita con un especialista en alimentacin y nutricin (nutricionista certificado) para que lo ayude a elaborar un plan de alimentacin saludable adecuado para usted.  No fume ni consuma ningn producto que contenga tabaco, lo que incluye cigarrillos, tabaco de mascar y cigarrillos electrnicos. Si necesita ayuda para dejar de fumar, consulte al mdico.  Tome los medicamentos recetados y de venta libre como se lo haya indicado el mdico. Posiblemente le receten medicamentos para ayudarlo a reducir el riesgo de tener diabetes tipo2.  Concurra a todas las visitas de seguimiento como se lo haya indicado el mdico. Esto es importante. Resumen  La prediabetes es la afeccin de tener un nivel de azcar en la sangre (glucemia ms alto de lo normal, aunque   no lo suficientemente alto para recibir un diagnstico de diabetes tipo2.  El hecho de ser prediabtico lo pone en riesgo de desarrollar diabetes tipo 2 (diabetes mellitus tipo2).  Para ayudar a prevenir la diabetes tipo2, haga cambios en el estilo de vida, como realizar actividad fsica y comer alimentos saludables. Baje de peso como se lo haya indicado el mdico. Esta informacin no tiene como fin reemplazar el consejo del mdico. Asegrese de hacerle al mdico cualquier pregunta que tenga. Document Revised:  09/22/2017 Document Reviewed: 08/15/2015 Elsevier Patient Education  2020 Elsevier Inc.  

## 2019-09-28 NOTE — Progress Notes (Signed)
There were no vitals taken for this visit.   Subjective:    Patient ID: Kevin Barron, male    DOB: 11/22/1965, 54 y.o.   MRN: [id number]  HPI: Kevin Barron is a 54 y.o. male presenting on 09/28/2019 for No chief complaint on file.   HPI   This is a telemedicine appointment due to coronavirus pandemic.  It is via Telephone as pt was having difficulty connecting through [employer] today.  I connected with  Kevin Barron on 09/28/19 by a video enabled telemedicine application and verified that I am speaking with the correct person using two identifiers.   I discussed the limitations of evaluation and management by telemedicine. The patient expressed understanding and agreed to proceed.   Pt is 2yoM with dyslipidemia.   Pt never picked up ifobt and reading information after last visit  He says he is doing well and he has no complaints today He says he isn't really watching his diet or exercising.     Relevant past medical, surgical, family and social history reviewed and updated as indicated. Interim medical history since our last visit reviewed. Allergies and medications reviewed and updated.  No current outpatient medications on file.     Review of Systems  Per HPI unless specifically indicated above     Objective:    There were no vitals taken for this visit.  Wt Readings from Last 3 Encounters:  03/27/19 198 lb (89.8 kg)  11/11/17 212 lb 8 oz (96.4 kg)  09/09/17 210 lb 8 oz (95.5 kg)    Physical Exam Pulmonary:     Effort: Pulmonary effort is normal. No respiratory distress.  Neurological:     Mental Status: He is alert and oriented to person, place, and time.  Psychiatric:        Attention and Perception: Attention normal.        Speech: Speech normal.        Behavior: Behavior is cooperative.     Results for orders placed or performed during the hospital encounter of 09/17/19  Lipid panel  Result Value Ref Range   Cholesterol 222 (H) 0 - 200 mg/dL   Triglycerides 76 <150 mg/dL   HDL 40 (L) >40 mg/dL   Total CHOL/HDL Ratio 5.6 RATIO   VLDL 15 0 - 40 mg/dL   LDL Cholesterol 167 (H) 0 - 99 mg/dL  Comprehensive metabolic panel  Result Value Ref Range   Sodium 137 135 - 145 mmol/L   Potassium 3.9 3.5 - 5.1 mmol/L   Chloride 104 98 - 111 mmol/L   CO2 25 22 - 32 mmol/L   Glucose, Bld 124 (H) 70 - 99 mg/dL   BUN 19 6 - 20 mg/dL   Creatinine, Ser 0.70 0.61 - 1.24 mg/dL   Calcium 8.7 (L) 8.9 - 10.3 mg/dL   Total Protein 7.6 6.5 - 8.1 g/dL   Albumin 4.1 3.5 - 5.0 g/dL   AST 22 15 - 41 U/L   ALT 25 0 - 44 U/L   Alkaline Phosphatase 45 38 - 126 U/L   Total Bilirubin 0.8 0.3 - 1.2 mg/dL   GFR calc non Af Amer >60 >60 mL/min   GFR calc Af Amer >60 >60 mL/min   Anion gap 8 5 - 15      Assessment & Plan:   Encounter Diagnoses  Name Primary?  . Hyperlipidemia, unspecified hyperlipidemia type Yes  . Prediabetes   . Screening for colon cancer   . Aortic valve stenosis,  etiology of cardiac valve disease unspecified   . Non-English speaking patient       -reviewed labs with pt -will start Simvastatin.  Encouraged pt to follow lowfat diet and exercise regularly.  He is printed reading information on this. -pt is reminded that he needs to pick up his ifobt for colon cancer screening.   -discussed prediabetes and encouraged healthy diet and regular exercise.  He is printed reading information on this -pt says he will stop by office today to pick up test and reading information -pt to follow up 3 months.  He will contact office sooner prn

## 2019-12-27 ENCOUNTER — Other Ambulatory Visit (HOSPITAL_COMMUNITY)
Admission: RE | Admit: 2019-12-27 | Discharge: 2019-12-27 | Disposition: A | Discharge status: Home / Self Care | Payer: Self-pay | Source: Ambulatory Visit | Attending: Physician Assistant | Admitting: Physician Assistant

## 2019-12-27 DIAGNOSIS — E785 Hyperlipidemia, unspecified: Secondary | ICD-10-CM | POA: Insufficient documentation

## 2019-12-27 LAB — COMPREHENSIVE METABOLIC PANEL
ALT: 29 U/L (ref 0–44)
AST: 21 U/L (ref 15–41)
Albumin: 4 g/dL (ref 3.5–5.0)
Alkaline Phosphatase: 45 U/L (ref 38–126)
Anion gap: 7 (ref 5–15)
BUN: 20 mg/dL (ref 6–20)
CO2: 26 mmol/L (ref 22–32)
Calcium: 8.6 mg/dL — ABNORMAL LOW (ref 8.9–10.3)
Chloride: 104 mmol/L (ref 98–111)
Creatinine, Ser: 0.74 mg/dL (ref 0.61–1.24)
GFR calc Af Amer: >60 mL/min (ref >60)
GFR calc non Af Amer: >60 mL/min (ref >60)
Glucose, Bld: 122 mg/dL — ABNORMAL HIGH (ref 70–99)
Potassium: 3.8 mmol/L (ref 3.5–5.1)
Sodium: 137 mmol/L (ref 135–145)
Total Bilirubin: 0.7 mg/dL (ref 0.3–1.2)
Total Protein: 7.2 g/dL (ref 6.5–8.1)

## 2019-12-27 LAB — LIPID PANEL
Cholesterol: 162 mg/dL (ref 0–200)
HDL: 36 mg/dL — ABNORMAL LOW (ref >40)
LDL Cholesterol: 107 mg/dL — ABNORMAL HIGH (ref 0–99)
Total CHOL/HDL Ratio: 4.5 RATIO
Triglycerides: 94 mg/dL (ref <150)
VLDL: 19 mg/dL (ref 0–40)

## 2019-12-29 ENCOUNTER — Encounter: Payer: Self-pay | Admitting: Physician Assistant

## 2019-12-29 ENCOUNTER — Other Ambulatory Visit: Payer: Self-pay

## 2019-12-29 ENCOUNTER — Ambulatory Visit: Payer: Self-pay | Admitting: Physician Assistant

## 2019-12-29 VITALS — BP 120/80 | HR 87 | Temp 97.7°F | Ht 63.75 in | Wt 213.5 lb

## 2019-12-29 DIAGNOSIS — Z789 Other specified health status: Secondary | ICD-10-CM

## 2019-12-29 DIAGNOSIS — R7303 Prediabetes: Secondary | ICD-10-CM

## 2019-12-29 DIAGNOSIS — E785 Hyperlipidemia, unspecified: Secondary | ICD-10-CM

## 2019-12-29 DIAGNOSIS — E669 Obesity, unspecified: Secondary | ICD-10-CM

## 2019-12-29 NOTE — Progress Notes (Signed)
BP 120/80   Pulse 87   Temp 97.7 F (36.5 C)   Ht 5' 3.75" (1.619 m)   Wt 213 lb 8 oz (96.8 kg)   SpO2 94%   BMI 36.94 kg/m    Subjective:    Patient ID: Kevin Barron, male    DOB: December 21, 1965, 54 y.o.   MRN: [id number]  HPI: Kevin Barron is a 54 y.o. male presenting on 12/29/2019 for Hyperlipidemia   HPI   Pt had a negative covid 19 screening questionnaire.     Pt is a 51yoM with dyslipidemia and prediabetes.  He is doing well and has no complaints.     Relevant past medical, surgical, family and social history reviewed and updated as indicated. Interim medical history since our last visit reviewed. Allergies and medications reviewed and updated.   Current Outpatient Medications:  .  simvastatin (ZOCOR) 20 MG tablet, Take 1 tablet (20 mg total) by mouth at bedtime. Tome una tableta por boca al dormir, Disp: 30 tablet, Rfl: 4    Review of Systems  Per HPI unless specifically indicated above     Objective:    BP 120/80   Pulse 87   Temp 97.7 F (36.5 C)   Ht 5' 3.75" (1.619 m)   Wt 213 lb 8 oz (96.8 kg)   SpO2 94%   BMI 36.94 kg/m   Wt Readings from Last 3 Encounters:  12/29/19 213 lb 8 oz (96.8 kg)  03/27/19 198 lb (89.8 kg)  11/11/17 212 lb 8 oz (96.4 kg)    Physical Exam Vitals reviewed.  Constitutional:      General: He is not in acute distress.    Appearance: He is well-developed. He is obese. He is not toxic-appearing.  HENT:     Head: Normocephalic and atraumatic.  Cardiovascular:     Rate and Rhythm: Normal rate and regular rhythm.     Heart sounds: Murmur heard.   Pulmonary:     Effort: Pulmonary effort is normal.     Breath sounds: Normal breath sounds. No wheezing.  Abdominal:     General: Bowel sounds are normal.     Palpations: Abdomen is soft.     Tenderness: There is no abdominal tenderness.  Musculoskeletal:     Cervical back: Neck supple.     Right lower leg: No edema.     Left lower leg: No edema.   Lymphadenopathy:     Cervical: No cervical adenopathy.  Skin:    General: Skin is warm and dry.  Neurological:     Mental Status: He is alert and oriented to person, place, and time.  Psychiatric:        Behavior: Behavior normal.     Results for orders placed or performed during the hospital encounter of 12/27/19  Lipid panel  Result Value Ref Range   Cholesterol 162 0 - 200 mg/dL   Triglycerides 94 <150 mg/dL   HDL 36 (L) >40 mg/dL   Total CHOL/HDL Ratio 4.5 RATIO   VLDL 19 0 - 40 mg/dL   LDL Cholesterol 107 (H) 0 - 99 mg/dL  Comprehensive metabolic panel  Result Value Ref Range   Sodium 137 135 - 145 mmol/L   Potassium 3.8 3.5 - 5.1 mmol/L   Chloride 104 98 - 111 mmol/L   CO2 26 22 - 32 mmol/L   Glucose, Bld 122 (H) 70 - 99 mg/dL   BUN 20 6 - 20 mg/dL   Creatinine, Ser 0.74 0.61 -  1.24 mg/dL   Calcium 8.6 (L) 8.9 - 10.3 mg/dL   Total Protein 7.2 6.5 - 8.1 g/dL   Albumin 4.0 3.5 - 5.0 g/dL   AST 21 15 - 41 U/L   ALT 29 0 - 44 U/L   Alkaline Phosphatase 45 38 - 126 U/L   Total Bilirubin 0.7 0.3 - 1.2 mg/dL   GFR calc non Af Amer >60 >60 mL/min   GFR calc Af Amer >60 >60 mL/min   Anion gap 7 5 - 15      Assessment & Plan:    Encounter Diagnoses  Name Primary?  . Hyperlipidemia, unspecified hyperlipidemia type Yes  . Obesity, unspecified classification, unspecified obesity type, unspecified whether serious comorbidity present   . Prediabetes   . Non-English speaking patient        -Reviewed labs with pt -Encouraged lowfat diet.  Pt to continue simvastatin -pt to Follow up 3 months.  He is to contact office sooner prn

## 2020-03-20 ENCOUNTER — Other Ambulatory Visit: Payer: Self-pay | Admitting: Student

## 2020-03-20 DIAGNOSIS — Z23 Encounter for immunization: Secondary | ICD-10-CM

## 2020-03-27 ENCOUNTER — Other Ambulatory Visit (HOSPITAL_COMMUNITY)
Admission: RE | Admit: 2020-03-27 | Discharge: 2020-03-27 | Disposition: A | Discharge status: Home / Self Care | Payer: Self-pay | Source: Ambulatory Visit | Attending: Physician Assistant | Admitting: Physician Assistant

## 2020-03-27 DIAGNOSIS — E785 Hyperlipidemia, unspecified: Secondary | ICD-10-CM

## 2020-03-27 DIAGNOSIS — R7303 Prediabetes: Secondary | ICD-10-CM

## 2020-03-27 LAB — COMPREHENSIVE METABOLIC PANEL
ALT: 30 U/L (ref 0–44)
AST: 22 U/L (ref 15–41)
Albumin: 3.9 g/dL (ref 3.5–5.0)
Alkaline Phosphatase: 45 U/L (ref 38–126)
Anion gap: 8 (ref 5–15)
BUN: 14 mg/dL (ref 6–20)
CO2: 26 mmol/L (ref 22–32)
Calcium: 8.7 mg/dL — ABNORMAL LOW (ref 8.9–10.3)
Chloride: 103 mmol/L (ref 98–111)
Creatinine, Ser: 0.68 mg/dL (ref 0.61–1.24)
GFR calc Af Amer: >60 mL/min (ref >60)
GFR calc non Af Amer: >60 mL/min (ref >60)
Glucose, Bld: 130 mg/dL — ABNORMAL HIGH (ref 70–99)
Potassium: 4 mmol/L (ref 3.5–5.1)
Sodium: 137 mmol/L (ref 135–145)
Total Bilirubin: 0.7 mg/dL (ref 0.3–1.2)
Total Protein: 7.3 g/dL (ref 6.5–8.1)

## 2020-03-27 LAB — LIPID PANEL
Cholesterol: 161 mg/dL (ref 0–200)
HDL: 33 mg/dL — ABNORMAL LOW (ref >40)
LDL Cholesterol: 108 mg/dL — ABNORMAL HIGH (ref 0–99)
Total CHOL/HDL Ratio: 4.9 RATIO
Triglycerides: 100 mg/dL (ref <150)
VLDL: 20 mg/dL (ref 0–40)

## 2020-03-27 LAB — HEMOGLOBIN A1C
Hgb A1c MFr Bld: 6.5 % — ABNORMAL HIGH (ref 4.8–5.6)
Mean Plasma Glucose: 139.85 mg/dL

## 2020-03-29 ENCOUNTER — Ambulatory Visit: Payer: Self-pay | Admitting: Physician Assistant

## 2020-03-30 ENCOUNTER — Encounter: Payer: Self-pay | Admitting: Physician Assistant

## 2020-03-30 ENCOUNTER — Ambulatory Visit: Payer: Self-pay | Admitting: Physician Assistant

## 2020-03-30 ENCOUNTER — Other Ambulatory Visit: Payer: Self-pay

## 2020-03-30 VITALS — BP 108/80 | HR 62 | Temp 97.1°F | Ht 63.75 in | Wt 214.0 lb

## 2020-03-30 DIAGNOSIS — Z789 Other specified health status: Secondary | ICD-10-CM

## 2020-03-30 DIAGNOSIS — I350 Nonrheumatic aortic (valve) stenosis: Secondary | ICD-10-CM

## 2020-03-30 DIAGNOSIS — E785 Hyperlipidemia, unspecified: Secondary | ICD-10-CM

## 2020-03-30 DIAGNOSIS — Z125 Encounter for screening for malignant neoplasm of prostate: Secondary | ICD-10-CM

## 2020-03-30 DIAGNOSIS — E669 Obesity, unspecified: Secondary | ICD-10-CM

## 2020-03-30 DIAGNOSIS — E119 Type 2 diabetes mellitus without complications: Secondary | ICD-10-CM

## 2020-03-30 MED ORDER — SIMVASTATIN 20 MG PO TABS: 20.0000 mg | ORAL_TABLET | Freq: Every day | ORAL | 4 refills | Status: DC

## 2020-03-30 MED ORDER — METFORMIN HCL ER 500 MG PO TB24: 500.0000 mg | ORAL_TABLET | Freq: Every day | ORAL | 4 refills | Status: DC

## 2020-03-30 NOTE — Progress Notes (Signed)
BP 108/80   Pulse 62   Temp (!) 97.1 F (36.2 C)   Ht 5' 3.75" (1.619 m)   Wt 214 lb (97.1 kg)   SpO2 93%   BMI 37.02 kg/m    Subjective:    Patient ID: Kevin Barron, male    DOB: 08-Dec-1965, 54 y.o.   MRN: [id number]  HPI: Kevin Barron is a 54 y.o. male presenting on 03/30/2020 for No chief complaint on file.   HPI  Pt had a negative covid 19 screening questionnaire.    Pt is 6yoM with appointment today to follow up dyslipidemia.  He says he is feeling well and has no complaints.  He Works sometimes doing Programmer, applications.  He did not return his iFOBT given last November.      Relevant past medical, surgical, family and social history reviewed and updated as indicated. Interim medical history since our last visit reviewed. Allergies and medications reviewed and updated.   Current Outpatient Medications:  .  simvastatin (ZOCOR) 20 MG tablet, Take 1 tablet (20 mg total) by mouth at bedtime. Tome una tableta por boca al dormir, Disp: 30 tablet, Rfl: 4   Review of Systems  Per HPI unless specifically indicated above     Objective:    BP 108/80   Pulse 62   Temp (!) 97.1 F (36.2 C)   Ht 5' 3.75" (1.619 m)   Wt 214 lb (97.1 kg)   SpO2 93%   BMI 37.02 kg/m   Wt Readings from Last 3 Encounters:  03/30/20 214 lb (97.1 kg)  12/29/19 213 lb 8 oz (96.8 kg)  03/27/19 198 lb (89.8 kg)    Physical Exam Vitals reviewed.  Constitutional:      General: He is not in acute distress.    Appearance: He is well-developed. He is obese. He is not ill-appearing.  HENT:     Head: Normocephalic and atraumatic.  Cardiovascular:     Rate and Rhythm: Normal rate and regular rhythm.     Heart sounds: Murmur heard.   Pulmonary:     Effort: Pulmonary effort is normal.     Breath sounds: Normal breath sounds. No wheezing.  Abdominal:     General: Bowel sounds are normal.     Palpations: Abdomen is soft.     Tenderness: There is no abdominal  tenderness.  Musculoskeletal:     Cervical back: Neck supple.     Right lower leg: No edema.     Left lower leg: No edema.  Lymphadenopathy:     Cervical: No cervical adenopathy.  Skin:    General: Skin is warm and dry.  Neurological:     Mental Status: He is alert and oriented to person, place, and time.  Psychiatric:        Attention and Perception: Attention normal.        Speech: Speech normal.        Behavior: Behavior normal. Behavior is cooperative.     Comments: Very pleasant and asks questions appropriately and is very interested.     Results for orders placed or performed during the hospital encounter of 03/27/20  Hemoglobin A1c  Result Value Ref Range   Hgb A1c MFr Bld 6.5 (H) 4.8 - 5.6 %   Mean Plasma Glucose 139.85 mg/dL  Lipid panel  Result Value Ref Range   Cholesterol 161 0 - 200 mg/dL   Triglycerides 100 <150 mg/dL   HDL 33 (L) >40 mg/dL   Total CHOL/HDL Ratio 4.9  RATIO   VLDL 20 0 - 40 mg/dL   LDL Cholesterol 108 (H) 0 - 99 mg/dL  Comprehensive metabolic panel  Result Value Ref Range   Sodium 137 135 - 145 mmol/L   Potassium 4.0 3.5 - 5.1 mmol/L   Chloride 103 98 - 111 mmol/L   CO2 26 22 - 32 mmol/L   Glucose, Bld 130 (H) 70 - 99 mg/dL   BUN 14 6 - 20 mg/dL   Creatinine, Ser 0.68 0.61 - 1.24 mg/dL   Calcium 8.7 (L) 8.9 - 10.3 mg/dL   Total Protein 7.3 6.5 - 8.1 g/dL   Albumin 3.9 3.5 - 5.0 g/dL   AST 22 15 - 41 U/L   ALT 30 0 - 44 U/L   Alkaline Phosphatase 45 38 - 126 U/L   Total Bilirubin 0.7 0.3 - 1.2 mg/dL   GFR calc non Af Amer >60 >60 mL/min   GFR calc Af Amer >60 >60 mL/min   Anion gap 8 5 - 15      Assessment & Plan:   Encounter Diagnoses  Name Primary?  . Diabetes mellitus without complication ([place]) Yes  . Hyperlipidemia, unspecified hyperlipidemia type   . Not proficient in [place] language   . Obesity, unspecified classification, unspecified obesity type, unspecified whether serious comorbidity present   . Aortic valve  stenosis, etiology of cardiac valve disease unspecified       -reviewed labs with pt -will Update echo- AS.  Last time it was done was 6 years ago -pt is counseled on new diagnosis of DM.  Will Add metformin.  Pt counseled on diet and is given reading information.  Discussed regular exercise and weight management as well. -pt is given cafa/application for [facility] [employer] financial assistance (for echo) -his Tdap was updated last week when he got his flu shot -pt to follow up 3 months.  He is to contact office sooner prn

## 2020-03-30 NOTE — Patient Instructions (Signed)
Diabetes mellitus y nutricin, en adultos Diabetes Mellitus and Nutrition, Adult Si sufre de diabetes (diabetes mellitus), es muy importante tener hbitos alimenticios saludables debido a que sus niveles de Designer, television/film set sangre (glucosa) se ven afectados en gran medida por lo que come y bebe. [place] alimentos saludables en las cantidades [place], aproximadamente a la [employer], [place] ayudar a:  Aeronautical engineer glucemia.  Disminuir el riesgo de sufrir una enfermedad cardaca.  Mejorar la presin arterial.  Science writer o mantener un peso saludable. Todas las personas que sufren de diabetes son diferentes y cada una tiene necesidades diferentes en cuanto a un plan de alimentacin. El mdico puede recomendarle que trabaje con un especialista en dietas y nutricin (nutricionista) para Financial trader plan para usted. Su plan de alimentacin puede variar segn factores como:  Las caloras que necesita.  Los medicamentos que toma.  Su peso.  Sus niveles de glucemia, presin arterial y colesterol.  Su nivel de [place].  Otras afecciones que tenga, como enfermedades cardacas o renales. Cmo me afectan los carbohidratos? Los carbohidratos, o hidratos de carbono, afectan su nivel de glucemia ms que cualquier otro tipo de alimento. La ingesta de carbohidratos naturalmente aumenta la cantidad de [employer]. El recuento de carbohidratos es un mtodo destinado a Catering manager un registro de la cantidad de carbohidratos que se consumen. El recuento de carbohidratos es importante para Theatre manager la glucemia a un nivel saludable, especialmente si utiliza insulina o toma determinados medicamentos por va oral para la diabetes. Es importante conocer la cantidad de carbohidratos que se pueden ingerir en cada comida sin correr Engineer, manufacturing. Esto es Psychologist, forensic. Su nutricionista puede ayudarlo a calcular la cantidad de carbohidratos que debe ingerir en cada comida y en cada  refrigerio. Entre los alimentos que contienen carbohidratos, se incluyen:  Pan, cereal, arroz, pastas y galletas.  Papas y maz.  Guisantes, frijoles y lentejas.  Leche y Estate agent.  Lambert Mody y [place].  Postres, como pasteles, galletas, helado y caramelos. Cmo me afecta el alcohol? El alcohol puede provocar disminuciones sbitas de la glucemia (hipoglucemia), especialmente si utiliza insulina o toma determinados medicamentos por va oral para la diabetes. La hipoglucemia es una afeccin potencialmente mortal. Los sntomas de la hipoglucemia (somnolencia, mareos y confusin) son similares a los sntomas de haber consumido demasiado alcohol. Si el mdico afirma que el alcohol es seguro para usted, [place] estas pautas:  Limite el consumo de alcohol a no ms de 26mdida por da si es mujer y no est e[place] y a 219midas si es hombre. Una medida equivale a 12oz (35564mde cerveza, 5oz (148m60me vino o 1oz (44ml75m bebidas alcohlicas de alta graduacin.  No beba con el estmago vaco.  Mantngase hidratado bebiendo agua, refrescos dietticos o t helado sin azcar.  Tenga en cuenta que los refrescos comunes, los jugos y otras bebida para mezclOptician, dispensingen contener mucha azcar y se deben contar como carbohidratos. Cules son algunos consejos para seguir este plan?  Leer las etiquetas de los alimentos  Comience por leer el tamao de la porcin en la "Informacin nutricional" en las etiquetas de los alimentos envasados y las bebidas. La cantidad de caloras, carbohidratos, grasas y otros nutrientes mencionados en la etiqueta se basan en una porcin del alimento. Muchos alimentos contienen ms de una porcin por envase.  Verifique la cantidad total de gramos (g) de carbohidratos totales en una porcin. Puede calcular la cantidad de porciones de carbohidratos al dividir el  total de carbohidratos por 15. Por ejemplo, si un alimento tiene un total de 30g de carbohidratos, equivale a 2  porciones de carbohidratos.  Verifique la cantidad de gramos (g) de grasas saturadas y grasas trans en una porcin. Escoja alimentos que no contengan grasa o que tengan un bajo contenido.  Verifique la cantidad de miligramos (mg) de sal (sodio) en una porcin. La [employer] de las personas deben limitar la ingesta de sodio total a menos de 2325m por dTraining and development officer  Siempre consulte la informacin nutricional de los alimentos etiquetados como "con bajo contenido de grasa" o "sin grasa". Estos alimentos pueden tener un mayor contenido de aLocation manageragregada o carbohidratos refinados, y deben evitarse.  Hable con su nutricionista para identificar sus objetivos diarios en cuanto a los nutrientes mencionados en la etiqueta. Al ir de compras  Evite comprar alimentos procesados, enlatados o precocinados. Estos alimentos tienden a tSpecial educational needs teachermayor cantidad de gGerlach sodio y azcar agregada.  Compre en la zona exterior de la tienda de comestibles. Esta zona incluye frutas y verduras frescas, granos a granel, carnes frescas y productos lcteos frescos. Al cocinar  Utilice mtodos de coccin a baja temperatura, como hornear, en lugar de mtodos de coccin a alta temperatura, como frer en abundante aceite.  Cocine con aceites saludables, como el aceite de oPeebles canola o gMilton  Evite cocinar con manteca, crema o carnes con alto contenido de grasa. Planificacin de las comidas  Coma las comidas y los refrigerios regularmente, preferentemente a la misma hora todos lMorrilton Evite pasar largos perodos de tiempo sin comer.  Consuma alimentos ricos en fibra, como frutas frescas, verduras, frijoles y cereales integrales. Consulte a su nutricionista sobre cuntas porciones de carbohidratos puede consumir en cada comida.  Consuma entre 4 y 6 onzas (oz) de protenas magras por da, como carnes mEllsworth pollo, pescado, huevos o tofu. Una onza de protena magra equivale a: ? 1 onza de carne, pollo o  pescado. ? 1huevo. ?  taza de tofu.  Coma algunos alimentos por da que contengan grasas saludables, como aguacates, frutos secos, semillas y pescado. Estilo de vida  Controle su nivel de glucemia con regularidad.  Haga actividad fsica habitualmente como se lo haya indicado el mdico. Esto puede incluir lo siguiente: ? 1551mutos semanales de ejercicio de intensidad moderada o alta. Esto podra incluir caminatas dinmicas, ciclismo o gimnasia acutica. ? Realizar ejercicios de elongacin y de fortalecimiento, como yoga o levantamiento de pesas, por lo menos 2veces por semana.  Tome los meTenneco Ince lo haya indicado el mdico.  No consuma ningn producto que contenga nicotina o tabaco, como cigarrillos y ciPsychologist, sport and exerciseSi necesita ayuda para dejar de fumar, consulte al mdHess Corporationon un asesor o instructor en diabetes para identificar estrategias para controlar el estrs y cualquier desafo emocional y social. Preguntas para hacerle al mdico  Es necesario que consulte a unRadio broadcast assistantn el cuidado de la diabetes?  Es necesario que me rena con un nutricionista?  A qu nmero puedo llamar si tengo preguntas?  Cules son los mejores momentos para controlar la glucemia? Dnde encontrar ms informacin:  Asociacin Estadounidense de la Diabetes (American Diabetes Association): diabetes.org  Academia de Nutricin y DiInformation systems managerAcademy of Nutrition and Dietetics): www.eatright.orAieaiabetes y las Enfermedades Digestivas y Renales (NSt John'S Episcopal [facility] Shoref Diabetes and Digestive and Kidney Diseases, NIH): wwDesMoinesFuneral.dkesumen  Un plan de alimentacin saludable lo ayudar a coAeronautical engineerlucemia y maTheatre managern estilo de vida saludable.  Trabajar con un especialista en dietas y nutricin (nutricionista) puede ayudarlo a elaborar el mejor plan de alimentacin para usted.  Tenga en cuenta que los carbohidratos (hidratos de  carbono) y el alcohol tienen efectos inmediatos en sus niveles de glucemia. Es importante contar los carbohidratos que ingiere y consumir alcohol con prudencia. Esta informacin no tiene como fin reemplazar el consejo del mdico. Asegrese de hacerle al mdico cualquier pregunta que tenga. Document Revised: 03/04/2017 Document Reviewed: 10/14/2016 Elsevier Patient Education  2020 Elsevier Inc.  

## 2020-04-23 ENCOUNTER — Emergency Department (HOSPITAL_COMMUNITY)
Admission: EM | Admit: 2020-04-23 | Discharge: 2020-04-24 | Disposition: A | Discharge status: Home / Self Care | Payer: Self-pay | Attending: Emergency Medicine | Admitting: Emergency Medicine

## 2020-04-23 ENCOUNTER — Encounter (HOSPITAL_COMMUNITY): Payer: Self-pay

## 2020-04-23 DIAGNOSIS — Z87891 Personal history of nicotine dependence: Secondary | ICD-10-CM | POA: Insufficient documentation

## 2020-04-23 DIAGNOSIS — S39012A Strain of muscle, fascia and tendon of lower back, initial encounter: Secondary | ICD-10-CM | POA: Diagnosis not present

## 2020-04-23 DIAGNOSIS — Y9389 Activity, other specified: Secondary | ICD-10-CM | POA: Diagnosis not present

## 2020-04-23 DIAGNOSIS — Z7984 Long term (current) use of oral hypoglycemic drugs: Secondary | ICD-10-CM | POA: Insufficient documentation

## 2020-04-23 DIAGNOSIS — Y92410 Unspecified street and highway as the place of occurrence of the external cause: Secondary | ICD-10-CM | POA: Insufficient documentation

## 2020-04-23 DIAGNOSIS — S3992XA Unspecified injury of lower back, initial encounter: Secondary | ICD-10-CM | POA: Diagnosis present
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
## 2020-04-24 ENCOUNTER — Ambulatory Visit (HOSPITAL_COMMUNITY)
Admission: RE | Admit: 2020-04-24 | Discharge: 2020-04-24 | Disposition: A | Discharge status: Home / Self Care | Payer: Self-pay | Source: Ambulatory Visit | Attending: Physician Assistant | Admitting: Physician Assistant

## 2020-04-24 ENCOUNTER — Other Ambulatory Visit: Payer: Self-pay

## 2020-04-24 DIAGNOSIS — I350 Nonrheumatic aortic (valve) stenosis: Secondary | ICD-10-CM | POA: Insufficient documentation

## 2020-04-24 LAB — ECHOCARDIOGRAM COMPLETE
AR max vel: 0.67 cm2
AV Area VTI: 0.8 cm2
AV Area mean vel: 0.64 cm2
AV Mean grad: 42 mmHg
AV Peak grad: 69.1 mmHg
Ao pk vel: 4.16 m/s
Area-P 1/2: 3.11 cm2
P 1/2 time: 473 msec
S' Lateral: 2.51 cm

## 2020-04-24 MED ORDER — CYCLOBENZAPRINE HCL 10 MG PO TABS: 10.0000 mg | ORAL_TABLET | Freq: Three times a day (TID) | ORAL | 0 refills | Status: AC

## 2020-04-24 NOTE — Progress Notes (Signed)
*  PRELIMINARY RESULTS* Echocardiogram 2D Echocardiogram has been performed.  Kevin Barron 04/24/2020, 11:06 AM
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
## 2020-06-08 ENCOUNTER — Other Ambulatory Visit: Payer: Self-pay | Admitting: Physician Assistant

## 2020-06-08 DIAGNOSIS — I351 Nonrheumatic aortic (valve) insufficiency: Secondary | ICD-10-CM

## 2020-06-08 DIAGNOSIS — I359 Nonrheumatic aortic valve disorder, unspecified: Secondary | ICD-10-CM

## 2020-06-08 DIAGNOSIS — I350 Nonrheumatic aortic (valve) stenosis: Secondary | ICD-10-CM

## 2020-06-15 ENCOUNTER — Ambulatory Visit: Payer: No Typology Code available for payment source | Attending: Internal Medicine

## 2020-06-15 DIAGNOSIS — Z23 Encounter for immunization: Secondary | ICD-10-CM

## 2020-06-15 NOTE — Progress Notes (Signed)
   Covid-19 Vaccination Clinic  Name:  Kevin Barron    MRN: [id number] DOB: Apr 24, 1966  06/15/2020  Mr. Kevin Barron was observed post Covid-19 immunization for 15 minutes without incident. He was provided with Vaccine Information Sheet and instruction to access the V-Safe system.   Mr. Kevin Barron was instructed to call 911 with any severe reactions post vaccine: Marland Kitchen Difficulty breathing  . Swelling of face and throat  . A fast heartbeat  . A bad rash all over body  . Dizziness and weakness   Immunizations Administered    No immunizations on file.

## 2020-06-22 ENCOUNTER — Ambulatory Visit: Payer: Self-pay

## 2020-06-29 ENCOUNTER — Other Ambulatory Visit: Payer: Self-pay | Admitting: Physician Assistant

## 2020-06-29 MED ORDER — METFORMIN HCL ER 500 MG PO TB24
500.0000 mg | ORAL_TABLET | Freq: Every day | ORAL | 1 refills | Status: DC
Start: 2020-06-29 — End: 2020-07-13

## 2020-07-10 ENCOUNTER — Other Ambulatory Visit (HOSPITAL_COMMUNITY)
Admission: RE | Admit: 2020-07-10 | Discharge: 2020-07-10 | Disposition: A | Discharge status: Home / Self Care | Payer: Self-pay | Source: Ambulatory Visit | Attending: Physician Assistant | Admitting: Physician Assistant

## 2020-07-10 ENCOUNTER — Other Ambulatory Visit: Payer: Self-pay

## 2020-07-10 DIAGNOSIS — E119 Type 2 diabetes mellitus without complications: Secondary | ICD-10-CM | POA: Insufficient documentation

## 2020-07-10 DIAGNOSIS — E785 Hyperlipidemia, unspecified: Secondary | ICD-10-CM | POA: Insufficient documentation

## 2020-07-10 DIAGNOSIS — Z125 Encounter for screening for malignant neoplasm of prostate: Secondary | ICD-10-CM | POA: Insufficient documentation

## 2020-07-10 LAB — COMPREHENSIVE METABOLIC PANEL
ALT: 33 U/L (ref 0–44)
AST: 24 U/L (ref 15–41)
Albumin: 4.1 g/dL (ref 3.5–5.0)
Alkaline Phosphatase: 43 U/L (ref 38–126)
Anion gap: 8 (ref 5–15)
BUN: 15 mg/dL (ref 6–20)
CO2: 27 mmol/L (ref 22–32)
Calcium: 9.1 mg/dL (ref 8.9–10.3)
Chloride: 102 mmol/L (ref 98–111)
Creatinine, Ser: 0.76 mg/dL (ref 0.61–1.24)
GFR, Estimated: >60 mL/min (ref >60)
Glucose, Bld: 111 mg/dL — ABNORMAL HIGH (ref 70–99)
Potassium: 4 mmol/L (ref 3.5–5.1)
Sodium: 137 mmol/L (ref 135–145)
Total Bilirubin: 0.8 mg/dL (ref 0.3–1.2)
Total Protein: 7.2 g/dL (ref 6.5–8.1)

## 2020-07-10 LAB — LIPID PANEL
Cholesterol: 195 mg/dL (ref 0–200)
HDL: 40 mg/dL — ABNORMAL LOW (ref >40)
LDL Cholesterol: 130 mg/dL — ABNORMAL HIGH (ref 0–99)
Total CHOL/HDL Ratio: 4.9 RATIO
Triglycerides: 124 mg/dL (ref <150)
VLDL: 25 mg/dL (ref 0–40)

## 2020-07-10 LAB — HEMOGLOBIN A1C
Hgb A1c MFr Bld: 5.7 % — ABNORMAL HIGH (ref 4.8–5.6)
Mean Plasma Glucose: 116.89 mg/dL

## 2020-07-10 LAB — PSA: Prostatic Specific Antigen: 1.41 ng/mL (ref 0.00–4.00)

## 2020-07-11 LAB — MICROALBUMIN, URINE: Microalb, Ur: 4.7 ug/mL — ABNORMAL HIGH

## 2020-07-13 ENCOUNTER — Ambulatory Visit: Payer: Self-pay | Admitting: Physician Assistant

## 2020-07-13 ENCOUNTER — Other Ambulatory Visit: Payer: Self-pay

## 2020-07-13 ENCOUNTER — Encounter: Payer: Self-pay | Admitting: Physician Assistant

## 2020-07-13 VITALS — BP 100/70 | HR 65 | Temp 97.1°F | Ht 63.75 in | Wt 201.0 lb

## 2020-07-13 DIAGNOSIS — M5442 Lumbago with sciatica, left side: Secondary | ICD-10-CM

## 2020-07-13 DIAGNOSIS — E119 Type 2 diabetes mellitus without complications: Secondary | ICD-10-CM

## 2020-07-13 DIAGNOSIS — E669 Obesity, unspecified: Secondary | ICD-10-CM

## 2020-07-13 DIAGNOSIS — G8929 Other chronic pain: Secondary | ICD-10-CM

## 2020-07-13 DIAGNOSIS — I350 Nonrheumatic aortic (valve) stenosis: Secondary | ICD-10-CM

## 2020-07-13 DIAGNOSIS — E785 Hyperlipidemia, unspecified: Secondary | ICD-10-CM

## 2020-07-13 DIAGNOSIS — Z789 Other specified health status: Secondary | ICD-10-CM

## 2020-07-13 MED ORDER — PREDNISONE 10 MG PO TABS: ORAL_TABLET | ORAL | 0 refills | Status: DC

## 2020-07-13 MED ORDER — SIMVASTATIN 40 MG PO TABS: 40.0000 mg | ORAL_TABLET | Freq: Every day | ORAL | 4 refills | Status: DC

## 2020-07-13 NOTE — Patient Instructions (Addendum)
Prednisone instructions:   Dias 1-2 tome 6 por boca por la manana. Dias 3-4 tome 5 tabletas por la manana. Dias 5-6 tome 4 por boca por la [employer]. Dias 7-8 tome 3 por boca por la [employer]. Dias 9-10 tome 2 por boca por la [employer]. Dias 11-12 tome 1 por boca por la [employer].
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
## 2020-07-20 ENCOUNTER — Other Ambulatory Visit: Payer: Self-pay

## 2020-07-20 ENCOUNTER — Ambulatory Visit (HOSPITAL_COMMUNITY)
Admission: RE | Admit: 2020-07-20 | Discharge: 2020-07-20 | Disposition: A | Discharge status: Home / Self Care | Payer: Self-pay | Source: Ambulatory Visit | Attending: Physician Assistant | Admitting: Physician Assistant

## 2020-07-20 DIAGNOSIS — G8929 Other chronic pain: Secondary | ICD-10-CM | POA: Insufficient documentation

## 2020-07-20 DIAGNOSIS — M5441 Lumbago with sciatica, right side: Secondary | ICD-10-CM | POA: Insufficient documentation

## 2020-07-20 DIAGNOSIS — M5442 Lumbago with sciatica, left side: Secondary | ICD-10-CM | POA: Insufficient documentation

## 2020-08-09 ENCOUNTER — Encounter: Payer: Self-pay | Admitting: Cardiology

## 2020-08-09 ENCOUNTER — Other Ambulatory Visit: Payer: Self-pay

## 2020-08-09 ENCOUNTER — Ambulatory Visit (INDEPENDENT_AMBULATORY_CARE_PROVIDER_SITE_OTHER): Payer: Self-pay | Admitting: Cardiology

## 2020-08-09 VITALS — BP 106/78 | HR 95 | Ht 66.0 in | Wt 202.0 lb

## 2020-08-09 DIAGNOSIS — I350 Nonrheumatic aortic (valve) stenosis: Secondary | ICD-10-CM

## 2020-08-09 DIAGNOSIS — E782 Mixed hyperlipidemia: Secondary | ICD-10-CM

## 2020-08-09 DIAGNOSIS — Q231 Congenital insufficiency of aortic valve: Secondary | ICD-10-CM

## 2020-08-09 NOTE — Patient Instructions (Signed)
Medication Instructions:  Your physician recommends that you continue on your current medications as directed. Please refer to the Current Medication list given to you today.  *If you need a refill on your cardiac medications before your next appointment, please call your pharmacy*   Lab Work: None today If you have labs (blood work) drawn today and your tests are completely normal, you will receive your results only by: Marland Kitchen MyChart Message (if you have MyChart) OR . A paper copy in the mail If you have any lab test that is abnormal or we need to change your treatment, we will call you to review the results.   Testing/Procedures: Your physician has requested that you have an echocardiogram in 6 months. Echocardiography is a painless test that uses sound waves to create images of your heart. It provides your doctor with information about the size and shape of your heart and how well your heart's chambers and valves are working. This procedure takes approximately one hour. There are no restrictions for this procedure.     Follow-Up: At [facility], you and your health needs are our priority.  As part of our continuing mission to provide you with exceptional heart care, we have created designated Provider Care Teams.  These Care Teams include your primary Cardiologist (physician) and Advanced Practice Providers (APPs -  Physician Assistants and Nurse Practitioners) who all work together to provide you with the care you need, when you need it.   Your next appointment:   6 month(s)  The format for your next appointment:   In Person  Provider:   Rozann Lesches, MD   Other Instructions  Spanish interpretor today was # [phone] through the language line.

## 2020-08-09 NOTE — Progress Notes (Signed)
Cardiology Office Note  Date: 08/09/2020   ID: Kevin Barron, DOB 19-Apr-1966, MRN [id number]  PCP:  Soyla Dryer, PA-C  Cardiologist:  Rozann Lesches, MD Electrophysiologist:  None   Chief Complaint  Patient presents with  . Referred with aortic stenosis    History of Present Illness: Kevin Barron is a 55 y.o. male referred for cardiology consultation by Ms. McElroy PA-C for the evaluation of aortic stenosis.  He is here today with his wife.  Video Spanish interpreter was used for the entire visit.  He states that he has been aware of having a heart murmur for at least 15 years or so.  He is a Pension scheme manager, works from home.  He does not report any unusual degree of shortness of breath, certainly nothing higher than NYHA class II based on discussion, no exertional angina, no lightheadedness or syncope.  He occasionally feels a brief palpitation.  No definite change in stamina over the last year.  Chart review finds evaluation by Dr. Johnsie Cancel back in 2014, history of moderate aortic stenosis at that time with bicuspid aortic valve, mean gradient reported 16 mmHg and dimensionless index 0.30.  Follow-up echocardiogram from October 2021 revealed LVEF 65 to 70% with moderate LVH, severely calcified aortic valve with mild to moderate aortic regurgitation and mean gradient 42 mmHg consistent with severe aortic stenosis.  Dimensionless index 0.21.  Valve appears to be functionally bicuspid on my review of the images.  I personally reviewed his ECG today which shows normal sinus rhythm with nonspecific T wave changes.  Today we discussed diagnosis of aortic stenosis, also treatment options including open AVR and TAVR.  Past Medical History:  Diagnosis Date  . Aortic stenosis   . Hyperlipidemia   . Obesity   . Type 2 diabetes mellitus ([place])     Past Surgical History:  Procedure Laterality Date  . LEG SURGERY Left     Current Outpatient Medications  Medication  Sig Dispense Refill  . OVER THE COUNTER MEDICATION Take 1 tablet by mouth daily as needed (back pain). Artifrin - pt takes for back pain    . predniSONE (DELTASONE) 10 MG tablet Days 1-2 take 6 po qam. Days 3-4 take 5 po qam. Days 5-6 take 4 po qam. Days 7-8 take 3 po qam. Days 9-10 take 2 po qam. Days 11-12 take 1 po qam.   Dias 1-2 tome 6 por boca por la [place]. Dias 3-4 tome 5 tabletas por la manana. Dias 5-6 tome 4 por boca por la [employer]. Dias 7-8 tome 3 por boca por la [employer]. Dias 9-10 tome 2 por boca por la [employer]. Dias 11-12 tome 1 por boca por la [employer]. 42 tablet 0  . simvastatin (ZOCOR) 40 MG tablet Take 1 tablet (40 mg total) by mouth at bedtime. Tome una tableta por boca al dormir 30 tablet 4   No current facility-administered medications for this visit.   Allergies:  Patient has no known allergies.   Social History: The patient  reports that he quit smoking about 27 years ago. His smoking use included cigarettes. He has a 14.00 pack-year smoking history. He has never used smokeless tobacco. He reports that he does not drink alcohol and does not use drugs.   Family History: The patient's family history includes Asthma in his mother; Diabetes in his father; Hypertension in his father.   ROS: Occasional mild ankle edema.  No orthopnea or PND reported.  Physical Exam: VS:  BP 106/78  Pulse 95   Ht 5\' 6"  (1.676 m)   Wt 202 lb (91.6 kg)   SpO2 97%   BMI 32.60 kg/m , BMI Body mass index is 32.6 kg/m.  Wt Readings from Last 3 Encounters:  08/09/20 202 lb (91.6 kg)  07/13/20 201 lb (91.2 kg)  03/30/20 214 lb (97.1 kg)    General: Patient appears comfortable at rest. HEENT: Conjunctiva and lids normal, wearing a mask. Neck: Supple, no elevated JVP or carotid bruits, no thyromegaly. Lungs: Clear to auscultation, nonlabored breathing at rest. Cardiac: Regular rate and rhythm, no S3, 4/6 basal systolic murmur, no pericardial rub. Abdomen: Soft, nontender, bowel sounds present, no  guarding or rebound. Extremities: No pitting edema, distal pulses 2+. Skin: Warm and dry. Musculoskeletal: No kyphosis. Neuropsychiatric: Alert and oriented x3, affect grossly appropriate.  ECG:  An ECG dated 03/27/2019 was personally reviewed today and demonstrated:  Sinus rhythm with leftward axis, increased voltage, nonspecific T wave changes.  Recent Labwork: 07/10/2020: ALT 33; AST 24; BUN 15; Creatinine, Ser 0.76; Potassium 4.0; Sodium 137     Component Value Date/Time   CHOL 195 07/10/2020 1049   TRIG 124 07/10/2020 1049   HDL 40 (L) 07/10/2020 1049   CHOLHDL 4.9 07/10/2020 1049   VLDL 25 07/10/2020 1049   LDLCALC 130 (H) 07/10/2020 1049    Other Studies Reviewed Today:  Echocardiogram 04/24/2020: 1. Left ventricular ejection fraction, by estimation, is 65 to 70%. The  left ventricle has normal function. The left ventricle has no regional  wall motion abnormalities. There is moderate left ventricular hypertrophy.  Left ventricular diastolic  parameters are indeterminate.  2. Right ventricular systolic function is normal. The right ventricular  size is normal.  3. The mitral valve is normal in structure. No evidence of mitral valve  regurgitation. No evidence of mitral stenosis.  4. The aortic valve is tricuspid. There is severe calcifcation of the  aortic valve. There is severe thickening of the aortic valve. Aortic valve  regurgitation is mild to moderate. Severe aortic valve stenosis. Severe  aortic stenosis is present. Aortic  valve mean gradient measures 42.0 mmHg. Aortic valve peak gradient  measures 69.1 mmHg. Aortic valve area, by VTI measures 0.80 cm.  5. The inferior vena cava is normal in size with greater than 50%  respiratory variability, suggesting right atrial pressure of 3 mmHg.   Assessment and Plan:  1.  Functionally bicuspid aortic valve with severe aortic stenosis based on recent echocardiogram, mean gradient 42 mmHg.  We discussed natural  course of aortic stenosis, also potential treatment strategies such as open AVR and TAVR.  From a functional perspective he does not describe any definite symptoms at this time with typical ADLs including Pension scheme manager work.  Plan is to bring him back in 6 months for repeat echocardiogram.  I did talk with him about referral to the valve clinic ultimately.  We discussed warning signs and symptoms.  He has no health insurance and we are reaching out to the [facility] assistance program to get him enrolled.  2.  Mixed hyperlipidemia, on Zocor at this point, last LDL 130.  Medication Adjustments/Labs and Tests Ordered: Current medicines are reviewed at length with the patient today.  Concerns regarding medicines are outlined above.   Tests Ordered: Orders Placed This Encounter  Procedures  . EKG 12-Lead  . ECHOCARDIOGRAM COMPLETE    Medication Changes: No orders of the defined types were placed in this encounter.   Disposition:  Follow up 6  months in the [place] office.  Signed, Satira Sark, MD, [facility] 08/09/2020 3:21 PM    [place] at [facility] [address] [place], [place] [address] Phone: [phone]; Fax: [phone]

## 2020-08-11 ENCOUNTER — Telehealth: Payer: Self-pay | Admitting: Licensed Clinical Social Worker

## 2020-08-11 NOTE — Telephone Encounter (Signed)
CSW received referral to assist patient with insurance options. CSW attempted to reach patient/wife and left message for return call. Raquel Sarna, [place], [place]

## 2020-09-22 ENCOUNTER — Encounter: Payer: Self-pay | Admitting: Orthopaedic Surgery

## 2020-10-19 ENCOUNTER — Other Ambulatory Visit: Payer: Self-pay | Admitting: Physician Assistant

## 2020-10-19 DIAGNOSIS — E785 Hyperlipidemia, unspecified: Secondary | ICD-10-CM

## 2020-10-19 DIAGNOSIS — E119 Type 2 diabetes mellitus without complications: Secondary | ICD-10-CM

## 2020-11-06 ENCOUNTER — Other Ambulatory Visit: Payer: Self-pay

## 2020-11-06 ENCOUNTER — Other Ambulatory Visit (HOSPITAL_COMMUNITY)
Admission: RE | Admit: 2020-11-06 | Discharge: 2020-11-06 | Disposition: A | Discharge status: Home / Self Care | Payer: Self-pay | Source: Ambulatory Visit | Attending: Physician Assistant | Admitting: Physician Assistant

## 2020-11-06 DIAGNOSIS — E785 Hyperlipidemia, unspecified: Secondary | ICD-10-CM | POA: Insufficient documentation

## 2020-11-06 DIAGNOSIS — E119 Type 2 diabetes mellitus without complications: Secondary | ICD-10-CM | POA: Insufficient documentation

## 2020-11-06 LAB — COMPREHENSIVE METABOLIC PANEL
ALT: 32 U/L (ref 0–44)
AST: 23 U/L (ref 15–41)
Albumin: 3.9 g/dL (ref 3.5–5.0)
Alkaline Phosphatase: 39 U/L (ref 38–126)
Anion gap: 5 (ref 5–15)
BUN: 17 mg/dL (ref 6–20)
CO2: 27 mmol/L (ref 22–32)
Calcium: 8.7 mg/dL — ABNORMAL LOW (ref 8.9–10.3)
Chloride: 105 mmol/L (ref 98–111)
Creatinine, Ser: 0.64 mg/dL (ref 0.61–1.24)
GFR, Estimated: >60 mL/min (ref >60)
Glucose, Bld: 116 mg/dL — ABNORMAL HIGH (ref 70–99)
Potassium: 3.9 mmol/L (ref 3.5–5.1)
Sodium: 137 mmol/L (ref 135–145)
Total Bilirubin: 0.6 mg/dL (ref 0.3–1.2)
Total Protein: 7 g/dL (ref 6.5–8.1)

## 2020-11-06 LAB — LIPID PANEL
Cholesterol: 200 mg/dL (ref 0–200)
HDL: 41 mg/dL (ref >40)
LDL Cholesterol: 145 mg/dL — ABNORMAL HIGH (ref 0–99)
Total CHOL/HDL Ratio: 4.9 RATIO
Triglycerides: 69 mg/dL (ref <150)
VLDL: 14 mg/dL (ref 0–40)

## 2020-11-06 LAB — HEMOGLOBIN A1C
Hgb A1c MFr Bld: 6 % — ABNORMAL HIGH (ref 4.8–5.6)
Mean Plasma Glucose: 125.5 mg/dL

## 2020-11-09 ENCOUNTER — Encounter: Payer: Self-pay | Admitting: Physician Assistant

## 2020-11-09 ENCOUNTER — Ambulatory Visit: Payer: Self-pay | Admitting: Physician Assistant

## 2020-11-09 DIAGNOSIS — E785 Hyperlipidemia, unspecified: Secondary | ICD-10-CM

## 2020-11-09 DIAGNOSIS — I350 Nonrheumatic aortic (valve) stenosis: Secondary | ICD-10-CM

## 2020-11-09 DIAGNOSIS — Z789 Other specified health status: Secondary | ICD-10-CM

## 2020-11-09 DIAGNOSIS — R7303 Prediabetes: Secondary | ICD-10-CM

## 2020-11-09 MED ORDER — ATORVASTATIN CALCIUM 40 MG PO TABS: 40.0000 mg | ORAL_TABLET | Freq: Every day | ORAL | 3 refills | Status: DC

## 2020-11-09 NOTE — Progress Notes (Signed)
There were no vitals taken for this visit.   Subjective:    Patient ID: Kevin Barron, male    DOB: 1965-08-16, 55 y.o.   MRN: [id number]  HPI: Kevin Barron is a 55 y.o. male presenting on 11/09/2020 for No chief complaint on file.   HPI    This is a telemedicine appointment through [employer] due to coronavirus pandemic.  I connected with  Kevin Barron on 11/09/20 by a video enabled telemedicine application and verified that I am speaking with the correct person using two identifiers.   I discussed the limitations of evaluation and management by telemedicine. The patient expressed understanding and agreed to proceed.  Pt is sitting in his Parked car.  Provider and translator are at office.     Pt is 29yoM with routine follow up for dyslipidemia.  Pt says he is doing well and has no complaints..  He was seen in February by cardiology for aortic valve disease and has  Follow up in August with repeat echo at that time.      Relevant past medical, surgical, family and social history reviewed and updated as indicated. Interim medical history since our last visit reviewed. Allergies and medications reviewed and updated.    Current Outpatient Medications:  .  simvastatin (ZOCOR) 40 MG tablet, Take 1 tablet (40 mg total) by mouth at bedtime. Tome una tableta por boca al dormir, Disp: 30 tablet, Rfl: 4   Review of Systems  Per HPI unless specifically indicated above     Objective:    There were no vitals taken for this visit.  Wt Readings from Last 3 Encounters:  08/09/20 202 lb (91.6 kg)  07/13/20 201 lb (91.2 kg)  03/30/20 214 lb (97.1 kg)    Physical Exam Constitutional:      General: He is not in acute distress.    Appearance: He is not toxic-appearing.  HENT:     Head: Normocephalic and atraumatic.  Pulmonary:     Effort: Pulmonary effort is normal. No respiratory distress.  Neurological:     Mental Status: He is alert and  oriented to person, place, and time.  Psychiatric:        Attention and Perception: Attention normal.        Speech: Speech normal.        Behavior: Behavior normal. Behavior is cooperative.        Results for orders placed or performed during the hospital encounter of 11/06/20  Hemoglobin A1c  Result Value Ref Range   Hgb A1c MFr Bld 6.0 (H) 4.8 - 5.6 %   Mean Plasma Glucose 125.5 mg/dL  Lipid panel  Result Value Ref Range   Cholesterol 200 0 - 200 mg/dL   Triglycerides 69 <150 mg/dL   HDL 41 >40 mg/dL   Total CHOL/HDL Ratio 4.9 RATIO   VLDL 14 0 - 40 mg/dL   LDL Cholesterol 145 (H) 0 - 99 mg/dL  Comprehensive metabolic panel  Result Value Ref Range   Sodium 137 135 - 145 mmol/L   Potassium 3.9 3.5 - 5.1 mmol/L   Chloride 105 98 - 111 mmol/L   CO2 27 22 - 32 mmol/L   Glucose, Bld 116 (H) 70 - 99 mg/dL   BUN 17 6 - 20 mg/dL   Creatinine, Ser 0.64 0.61 - 1.24 mg/dL   Calcium 8.7 (L) 8.9 - 10.3 mg/dL   Total Protein 7.0 6.5 - 8.1 g/dL   Albumin 3.9 3.5 - 5.0 g/dL  AST 23 15 - 41 U/L   ALT 32 0 - 44 U/L   Alkaline Phosphatase 39 38 - 126 U/L   Total Bilirubin 0.6 0.3 - 1.2 mg/dL   GFR, Estimated >60 >60 mL/min   Anion gap 5 5 - 15      Assessment & Plan:    Encounter Diagnoses  Name Primary?  . Hyperlipidemia, unspecified hyperlipidemia type Yes  . Aortic valve stenosis, etiology of cardiac valve disease unspecified   . Prediabetes   . Not proficient in [place] language       -Reviewed labs with pt -Change to Atorvastatin -pt counseled to Watch lowfat diet -pt to follow up 3 months.  He is to contact office sooner prn

## 2021-02-12 ENCOUNTER — Ambulatory Visit (HOSPITAL_COMMUNITY)
Admission: RE | Admit: 2021-02-12 | Discharge: 2021-02-12 | Disposition: A | Discharge status: Home / Self Care | Payer: Self-pay | Source: Ambulatory Visit | Attending: Cardiology | Admitting: Cardiology

## 2021-02-12 ENCOUNTER — Other Ambulatory Visit: Payer: Self-pay

## 2021-02-12 DIAGNOSIS — I350 Nonrheumatic aortic (valve) stenosis: Secondary | ICD-10-CM | POA: Insufficient documentation

## 2021-02-12 LAB — ECHOCARDIOGRAM COMPLETE
AR max vel: 0.34 cm2
AV Area VTI: 0.49 cm2
AV Area mean vel: 0.42 cm2
AV Mean grad: 33.5 mmHg
AV Peak grad: 60.7 mmHg
Ao pk vel: 3.9 m/s
S' Lateral: 3 cm

## 2021-02-12 NOTE — Progress Notes (Signed)
*  PRELIMINARY RESULTS* Echocardiogram 2D Echocardiogram has been performed.  Samuel Germany 02/12/2021, 1:48 PM

## 2021-02-19 ENCOUNTER — Encounter: Payer: Self-pay | Admitting: Cardiology

## 2021-02-19 ENCOUNTER — Other Ambulatory Visit: Payer: Self-pay

## 2021-02-19 ENCOUNTER — Ambulatory Visit (INDEPENDENT_AMBULATORY_CARE_PROVIDER_SITE_OTHER): Payer: Self-pay | Admitting: Cardiology

## 2021-02-19 VITALS — BP 116/84 | HR 70 | Ht 66.0 in | Wt 212.0 lb

## 2021-02-19 DIAGNOSIS — I350 Nonrheumatic aortic (valve) stenosis: Secondary | ICD-10-CM

## 2021-02-19 DIAGNOSIS — E782 Mixed hyperlipidemia: Secondary | ICD-10-CM

## 2021-02-19 NOTE — Progress Notes (Addendum)
Cardiology Office Note  Date: 02/19/2021   ID: Kevin Barron, DOB [birth year], MRN [id number]  PCP:  Soyla Dryer, PA-C  Cardiologist:  Rozann Lesches, MD Electrophysiologist:  None   Chief Complaint  Patient presents with   Cardiac follow-up    History of Present Illness: Kevin Barron is a 55 y.o. male seen in consultation back in February.  He is here today with his wife for a follow-up visit.  Entirety of the encounter was done with Spanish interpreter on speaker phone.  Since last assessment he reports having progressive exertional symptomatology, fatigue and feeling of anxiety when he exerts himself, also near syncope but no definite chest pain.  This depends on level of activity, he has had no associated palpitations.  Recent follow-up echocardiogram revealed LVEF 55 to 60% with mild LVH, functionally bicuspid aortic valve with severe aortic stenosis as well as mild to moderate aortic regurgitation.  There were differences in valve calculations based on LVOT measurement.  Mean gradient reported at 34 mmHg and dimensionless index 0.25.  Heart rate recorded at 40 today, although falsely bradycardic in the setting of PVCs which were confirmed on examination and by ECG.  Resting heart rate is actually in the 70s.  We discussed the results of his follow-up echocardiogram and plan for referral to the structural heart team to discuss further work-up for aortic valve replacement.  Past Medical History:  Diagnosis Date   Aortic stenosis    Hyperlipidemia    Obesity    Type 2 diabetes mellitus (HCC)     Past Surgical History:  Procedure Laterality Date   LEG SURGERY Left     Current Outpatient Medications  Medication Sig Dispense Refill   atorvastatin (LIPITOR) 40 MG tablet Take 1 tablet (40 mg total) by mouth daily. Tome una tableta por boca diaria 30 tablet 3   No current facility-administered medications for this visit.   Allergies:  Patient has  no known allergies.   Social History: The patient  reports that he quit smoking about 27 years ago. His smoking use included cigarettes. He has a 14.00 pack-year smoking history. He has never used smokeless tobacco. He reports that he does not drink alcohol and does not use drugs.   Family History: The patient's family history includes Asthma in his mother; Diabetes in his father; Hypertension in his father.   ROS: No orthopnea or PND.  Physical Exam: VS:  BP 116/84   Pulse 70   Ht '5\' 6"'$  (1.676 m)   Wt 212 lb (96.2 kg)   SpO2 96%   BMI 34.22 kg/m , BMI Body mass index is 34.22 kg/m.  Wt Readings from Last 3 Encounters:  02/19/21 212 lb (96.2 kg)  08/09/20 202 lb (91.6 kg)  07/13/20 201 lb (91.2 kg)    General: Patient appears comfortable at rest. HEENT: Conjunctiva and lids normal, wearing a mask. Neck: Supple, no elevated JVP or carotid bruits, no thyromegaly. Lungs: Clear to auscultation, nonlabored breathing at rest. Cardiac: Regular rate and rhythm with ectopy, no S3, 4/6 systolic murmur, 2/6 diastolic murmur, no pericardial rub. Abdomen: Soft, nontender, bowel sounds present. Extremities: No pitting edema, distal pulses 2+. Skin: Warm and dry. Musculoskeletal: No kyphosis. Neuropsychiatric: Alert and oriented x3, affect grossly appropriate.  ECG:  An ECG dated 08/09/2020 was personally reviewed today and demonstrated:  Sinus rhythm with nonspecific ST-T changes.  Recent Labwork: 11/06/2020: ALT 32; AST 23; BUN 17; Creatinine, Ser 0.64; Potassium 3.9; Sodium 137  Component Value Date/Time   CHOL 200 11/06/2020 0952   TRIG 69 11/06/2020 0952   HDL 41 11/06/2020 0952   CHOLHDL 4.9 11/06/2020 0952   VLDL 14 11/06/2020 0952   LDLCALC 145 (H) 11/06/2020 0952    Other Studies Reviewed Today:  Echocardiogram 02/12/2021:  1. Mitral inflow not performed . Left ventricular ejection fraction, by  estimation, is 55 to 60%. The left ventricle has normal function. The left   ventricle has no regional wall motion abnormalities. There is mild left  ventricular hypertrophy. Left  ventricular diastolic parameters are indeterminate.   2. Right ventricular systolic function is normal. The right ventricular  size is normal.   3. The mitral valve is normal in structure. No evidence of mitral valve  regurgitation. No evidence of mitral stenosis.   4. Gradients lower than measured 04/24/20 but AS still severe AVA  calculates much lower as LVOT used on this study 1.6 vs 2.2 on previous  DVI gone from 0.21-> 0.25 AV appears tricuspid but cannot r/o bicupid with  partial fusion of left and right cusps .   The aortic valve is tricuspid. There is severe calcifcation of the aortic  valve. There is severe thickening of the aortic valve. Aortic valve  regurgitation is mild to moderate. Severe aortic valve stenosis.   5. The inferior vena cava is normal in size with greater than 50%  respiratory variability, suggesting right atrial pressure of 3 mmHg.   Assessment and Plan:  1.  Symptomatic severe aortic stenosis with functionally bicuspid valve, recent echocardiogram noted above.  Although valve gradients and DVI measuring somewhat lower, this is in the setting of different technique.  In light of worsening symptoms further work-up for aortic valve replacement is now indicated.  Reviewed with structural heart team and plan will be to schedule a left and right heart catheterization with Dr. Angelena Form and then determine whether he is a better candidate for open AVR and referral to TCTS versus potentially TAVR work-up.  He will need a Spanish interpreter for this encounter.  2.  Mixed hyperlipidemia, he is on Lipitor at this point based on most recent lipid panel, switched from Zocor.  Medication Adjustments/Labs and Tests Ordered: Current medicines are reviewed at length with the patient today.  Concerns regarding medicines are outlined above.   Tests Ordered: Orders Placed This  Encounter  Procedures   Ambulatory referral to Structural Heart/Valve Clinic (only at [place])   EKG 12-Lead     Medication Changes: No orders of the defined types were placed in this encounter.   Disposition:  Follow up  for consultation with the structural heart team.  Signed, Satira Sark, MD, [facility] 02/19/2021 3:45 PM    [place] [facility] [place]. [address] [place], [place] [address] Phone: [phone]; Fax: [phone]

## 2021-02-19 NOTE — Patient Instructions (Signed)
Medication Instructions:  Your physician recommends that you continue on your current medications as directed. Please refer to the Current Medication list given to you today.  *If you need a refill on your cardiac medications before your next appointment, please call your pharmacy*   Lab Work: None  If you have labs (blood work) drawn today and your tests are completely normal, you will receive your results only by: H. [facility] (if you have MyChart) OR A paper copy in the mail If you have any lab test that is abnormal or we need to change your treatment, we will call you to review the results.   Testing/Procedures: None    Follow-Up: At [facility], you and your health needs are our priority.  As part of our continuing mission to provide you with exceptional heart care, we have created designated Provider Care Teams.  These Care Teams include your primary Cardiologist (physician) and Advanced Practice Providers (APPs -  Physician Assistants and Nurse Practitioners) who all work together to provide you with the care you need, when you need it.  We recommend signing up for the patient portal called "MyChart".  Sign up information is provided on this After Visit Summary.  MyChart is used to connect with patients for Virtual Visits (Telemedicine).  Patients are able to view lab/test results, encounter notes, upcoming appointments, etc.  Non-urgent messages can be sent to your provider as well.   To learn more about what you can do with MyChart, go to [email or web address].    Your next appointment:   3 month(s)  The format for your next appointment:   In Person  Provider:   Rozann Lesches, MD   Other Instructions You have been referred to [facility] in [place]. They will call you to make an appointment.

## 2021-02-19 NOTE — H&P (View-Only) (Signed)
Cardiology Office Note  Date: 02/19/2021   ID: Kevin Barron, DOB Nov 06, 1965, MRN [id number]  PCP:  Soyla Dryer, PA-C  Cardiologist:  Rozann Lesches, MD Electrophysiologist:  None   Chief Complaint  Patient presents with   Cardiac follow-up    History of Present Illness: Kevin Barron is a 55 y.o. male seen in consultation back in February.  He is here today with his wife for a follow-up visit.  Entirety of the encounter was done with Spanish interpreter on speaker phone.  Since last assessment he reports having progressive exertional symptomatology, fatigue and feeling of anxiety when he exerts himself, also near syncope but no definite chest pain.  This depends on level of activity, he has had no associated palpitations.  Recent follow-up echocardiogram revealed LVEF 55 to 60% with mild LVH, functionally bicuspid aortic valve with severe aortic stenosis as well as mild to moderate aortic regurgitation.  There were differences in valve calculations based on LVOT measurement.  Mean gradient reported at 34 mmHg and dimensionless index 0.25.  Heart rate recorded at 40 today, although falsely bradycardic in the setting of PVCs which were confirmed on examination and by ECG.  Resting heart rate is actually in the 70s.  We discussed the results of his follow-up echocardiogram and plan for referral to the structural heart team to discuss further work-up for aortic valve replacement.  Past Medical History:  Diagnosis Date   Aortic stenosis    Hyperlipidemia    Obesity    Type 2 diabetes mellitus (HCC)     Past Surgical History:  Procedure Laterality Date   LEG SURGERY Left     Current Outpatient Medications  Medication Sig Dispense Refill   atorvastatin (LIPITOR) 40 MG tablet Take 1 tablet (40 mg total) by mouth daily. Tome una tableta por boca diaria 30 tablet 3   No current facility-administered medications for this visit.   Allergies:  Patient has  no known allergies.   Social History: The patient  reports that he quit smoking about 27 years ago. His smoking use included cigarettes. He has a 14.00 pack-year smoking history. He has never used smokeless tobacco. He reports that he does not drink alcohol and does not use drugs.   Family History: The patient's family history includes Asthma in his mother; Diabetes in his father; Hypertension in his father.   ROS: No orthopnea or PND.  Physical Exam: VS:  BP 116/84   Pulse 70   Ht '5\' 6"'$  (1.676 m)   Wt 212 lb (96.2 kg)   SpO2 96%   BMI 34.22 kg/m , BMI Body mass index is 34.22 kg/m.  Wt Readings from Last 3 Encounters:  02/19/21 212 lb (96.2 kg)  08/09/20 202 lb (91.6 kg)  07/13/20 201 lb (91.2 kg)    General: Patient appears comfortable at rest. HEENT: Conjunctiva and lids normal, wearing a mask. Neck: Supple, no elevated JVP or carotid bruits, no thyromegaly. Lungs: Clear to auscultation, nonlabored breathing at rest. Cardiac: Regular rate and rhythm with ectopy, no S3, 4/6 systolic murmur, 2/6 diastolic murmur, no pericardial rub. Abdomen: Soft, nontender, bowel sounds present. Extremities: No pitting edema, distal pulses 2+. Skin: Warm and dry. Musculoskeletal: No kyphosis. Neuropsychiatric: Alert and oriented x3, affect grossly appropriate.  ECG:  An ECG dated 08/09/2020 was personally reviewed today and demonstrated:  Sinus rhythm with nonspecific ST-T changes.  Recent Labwork: 11/06/2020: ALT 32; AST 23; BUN 17; Creatinine, Ser 0.64; Potassium 3.9; Sodium 137  Component Value Date/Time   CHOL 200 11/06/2020 0952   TRIG 69 11/06/2020 0952   HDL 41 11/06/2020 0952   CHOLHDL 4.9 11/06/2020 0952   VLDL 14 11/06/2020 0952   LDLCALC 145 (H) 11/06/2020 0952    Other Studies Reviewed Today:  Echocardiogram 02/12/2021:  1. Mitral inflow not performed . Left ventricular ejection fraction, by  estimation, is 55 to 60%. The left ventricle has normal function. The left   ventricle has no regional wall motion abnormalities. There is mild left  ventricular hypertrophy. Left  ventricular diastolic parameters are indeterminate.   2. Right ventricular systolic function is normal. The right ventricular  size is normal.   3. The mitral valve is normal in structure. No evidence of mitral valve  regurgitation. No evidence of mitral stenosis.   4. Gradients lower than measured 04/24/20 but AS still severe AVA  calculates much lower as LVOT used on this study 1.6 vs 2.2 on previous  DVI gone from 0.21-> 0.25 AV appears tricuspid but cannot r/o bicupid with  partial fusion of left and right cusps .   The aortic valve is tricuspid. There is severe calcifcation of the aortic  valve. There is severe thickening of the aortic valve. Aortic valve  regurgitation is mild to moderate. Severe aortic valve stenosis.   5. The inferior vena cava is normal in size with greater than 50%  respiratory variability, suggesting right atrial pressure of 3 mmHg.   Assessment and Plan:  1.  Symptomatic severe aortic stenosis with functionally bicuspid valve, recent echocardiogram noted above.  Although valve gradients and DVI measuring somewhat lower, this is in the setting of different technique.  In light of worsening symptoms further work-up for aortic valve replacement is now indicated.  Reviewed with structural heart team and plan will be to schedule a left and right heart catheterization with Dr. Angelena Form and then determine whether he is a better candidate for open AVR and referral to TCTS versus potentially TAVR work-up.  He will need a Spanish interpreter for this encounter.  2.  Mixed hyperlipidemia, he is on Lipitor at this point based on most recent lipid panel, switched from Zocor.  Medication Adjustments/Labs and Tests Ordered: Current medicines are reviewed at length with the patient today.  Concerns regarding medicines are outlined above.   Tests Ordered: Orders Placed This  Encounter  Procedures   Ambulatory referral to Structural Heart/Valve Clinic (only at [place])   EKG 12-Lead     Medication Changes: No orders of the defined types were placed in this encounter.   Disposition:  Follow up  for consultation with the structural heart team.  Signed, Satira Sark, MD, [facility] 02/19/2021 3:45 PM    [place] [facility] [place]. [address], [place], [place] [address] Phone: [phone]; Fax: [phone]

## 2021-02-20 ENCOUNTER — Other Ambulatory Visit: Payer: Self-pay | Admitting: Cardiology

## 2021-02-20 ENCOUNTER — Telehealth: Payer: Self-pay

## 2021-02-20 DIAGNOSIS — Z01818 Encounter for other preprocedural examination: Secondary | ICD-10-CM

## 2021-02-20 DIAGNOSIS — I350 Nonrheumatic aortic (valve) stenosis: Secondary | ICD-10-CM

## 2021-02-20 MED ORDER — SODIUM CHLORIDE 0.9% FLUSH: 3.0000 mL | Freq: Two times a day (BID) | INTRAVENOUS | Status: DC

## 2021-02-20 NOTE — Telephone Encounter (Signed)
R/L heart cath scheduled for 02/28/2021 at [facility] with [facility] at 10 am,patient will arrive at 8 am.   Lab work will be done tomorrow 8/17 at [facility] outpatient lab.   Patient will stop by office to get cath instructions.   I used language [employer] [phone] and used interpretors [id number] and [phone]

## 2021-02-21 ENCOUNTER — Other Ambulatory Visit (HOSPITAL_COMMUNITY)
Admission: RE | Admit: 2021-02-21 | Discharge: 2021-02-21 | Disposition: A | Discharge status: Home / Self Care | Payer: Self-pay | Source: Ambulatory Visit | Attending: Physician Assistant | Admitting: Physician Assistant

## 2021-02-21 ENCOUNTER — Other Ambulatory Visit: Payer: Self-pay

## 2021-02-21 DIAGNOSIS — Z01818 Encounter for other preprocedural examination: Secondary | ICD-10-CM | POA: Insufficient documentation

## 2021-02-21 LAB — CBC
HCT: 43.2 % (ref 39.0–52.0)
Hemoglobin: 14.6 g/dL (ref 13.0–17.0)
MCH: 31.7 pg (ref 26.0–34.0)
MCHC: 33.8 g/dL (ref 30.0–36.0)
MCV: 93.7 fL (ref 80.0–100.0)
Platelets: 264 10*3/uL (ref 150–400)
RBC: 4.61 MIL/uL (ref 4.22–5.81)
RDW: 13.4 % (ref 11.5–15.5)
WBC: 9 10*3/uL (ref 4.0–10.5)
nRBC: 0 % (ref 0.0–0.2)

## 2021-02-21 LAB — BASIC METABOLIC PANEL
Anion gap: 1 — ABNORMAL LOW (ref 5–15)
BUN: 17 mg/dL (ref 6–20)
CO2: 34 mmol/L — ABNORMAL HIGH (ref 22–32)
Calcium: 8.3 mg/dL — ABNORMAL LOW (ref 8.9–10.3)
Chloride: 102 mmol/L (ref 98–111)
Creatinine, Ser: 0.71 mg/dL (ref 0.61–1.24)
GFR, Estimated: >60 mL/min (ref >60)
Glucose, Bld: 122 mg/dL — ABNORMAL HIGH (ref 70–99)
Potassium: 4.1 mmol/L (ref 3.5–5.1)
Sodium: 137 mmol/L (ref 135–145)

## 2021-02-22 ENCOUNTER — Other Ambulatory Visit: Payer: Self-pay | Admitting: Physician Assistant

## 2021-02-22 DIAGNOSIS — E785 Hyperlipidemia, unspecified: Secondary | ICD-10-CM

## 2021-02-27 ENCOUNTER — Telehealth: Payer: Self-pay | Admitting: *Deleted

## 2021-02-27 NOTE — Telephone Encounter (Signed)
Reviewed procedure/mask/visitor instructions with patient-[employer] ID [id number].

## 2021-02-27 NOTE — Telephone Encounter (Signed)
Cardiac catheterization scheduled at [facility] for: Wednesday February 28, 2021 [facility] Main Entrance [facility]) at: 8 AM   No solid food after midnight prior to cath, clear liquids until 5 AM day of procedure.   Morning medications can be taken pre-cath with sips of water including aspirin 81 mg.    Confirmed patient has responsible adult to drive home post procedure and be with patient first 24 hours after arriving home.  Patients are allowed one visitor in the waiting room during the time they are at the hospital for their procedure. Both patient and visitor must wear a mask once they enter the hospital.   Patient reports does not currently have any symptoms concerning for COVID-19 and no household members with COVID-19 like illness.      [employer] Arlette ID [id number] placed to patient to review procedure instructions -no answer, left message voicemail message to call back.

## 2021-02-28 ENCOUNTER — Other Ambulatory Visit: Payer: Self-pay

## 2021-02-28 ENCOUNTER — Encounter (HOSPITAL_COMMUNITY): Payer: Self-pay | Admitting: Cardiovascular Disease

## 2021-02-28 ENCOUNTER — Encounter (HOSPITAL_COMMUNITY)
Admission: RE | Disposition: A | Discharge status: Home / Self Care | Payer: Self-pay | Source: Home / Self Care | Attending: Cardiovascular Disease

## 2021-02-28 ENCOUNTER — Ambulatory Visit (HOSPITAL_COMMUNITY)
Admission: RE | Admit: 2021-02-28 | Discharge: 2021-02-28 | Disposition: A | Discharge status: Home / Self Care | Payer: Self-pay | Attending: Cardiovascular Disease | Admitting: Cardiovascular Disease

## 2021-02-28 DIAGNOSIS — E782 Mixed hyperlipidemia: Secondary | ICD-10-CM | POA: Insufficient documentation

## 2021-02-28 DIAGNOSIS — I350 Nonrheumatic aortic (valve) stenosis: Secondary | ICD-10-CM | POA: Insufficient documentation

## 2021-02-28 DIAGNOSIS — Z87891 Personal history of nicotine dependence: Secondary | ICD-10-CM | POA: Insufficient documentation

## 2021-02-28 DIAGNOSIS — Z79899 Other long term (current) drug therapy: Secondary | ICD-10-CM | POA: Insufficient documentation

## 2021-02-28 HISTORY — PX: RIGHT/LEFT HEART CATH AND CORONARY ANGIOGRAPHY: CATH118266

## 2021-02-28 LAB — POCT I-STAT 7, (LYTES, BLD GAS, ICA,H+H)
Acid-Base Excess: 0 mmol/L (ref 0.0–2.0)
Bicarbonate: 25.6 mmol/L (ref 20.0–28.0)
Calcium, Ion: 1.03 mmol/L — ABNORMAL LOW (ref 1.15–1.40)
HCT: 36 % — ABNORMAL LOW (ref 39.0–52.0)
Hemoglobin: 12.2 g/dL — ABNORMAL LOW (ref 13.0–17.0)
O2 Saturation: 99 %
Potassium: 3.6 mmol/L (ref 3.5–5.1)
Sodium: 145 mmol/L (ref 135–145)
TCO2: 27 mmol/L (ref 22–32)
pCO2 arterial: 45.2 mmHg (ref 32.0–48.0)
pH, Arterial: 7.36 (ref 7.350–7.450)
pO2, Arterial: 132 mmHg — ABNORMAL HIGH (ref 83.0–108.0)

## 2021-02-28 LAB — POCT I-STAT EG7
Acid-Base Excess: 2 mmol/L (ref 0.0–2.0)
Bicarbonate: 28.6 mmol/L — ABNORMAL HIGH (ref 20.0–28.0)
Calcium, Ion: 1.17 mmol/L (ref 1.15–1.40)
HCT: 38 % — ABNORMAL LOW (ref 39.0–52.0)
Hemoglobin: 12.9 g/dL — ABNORMAL LOW (ref 13.0–17.0)
O2 Saturation: 76 %
Potassium: 3.9 mmol/L (ref 3.5–5.1)
Sodium: 142 mmol/L (ref 135–145)
TCO2: 30 mmol/L (ref 22–32)
pCO2, Ven: 54.1 mmHg (ref 44.0–60.0)
pH, Ven: 7.331 (ref 7.250–7.430)
pO2, Ven: 45 mmHg (ref 32.0–45.0)

## 2021-02-28 LAB — GLUCOSE, CAPILLARY: Glucose-Capillary: 127 mg/dL — ABNORMAL HIGH (ref 70–99)

## 2021-02-28 SURGERY — RIGHT/LEFT HEART CATH AND CORONARY ANGIOGRAPHY
Anesthesia: LOCAL

## 2021-02-28 MED ORDER — SODIUM CHLORIDE 0.9% FLUSH: 3.0000 mL | INTRAVENOUS | Status: DC | PRN

## 2021-02-28 MED ORDER — HEPARIN (PORCINE) IN NACL 1000-0.9 UT/500ML-% IV SOLN
INTRAVENOUS | Status: DC | PRN
  Administered 2021-02-28 (×2): 500 mL

## 2021-02-28 MED ORDER — SODIUM CHLORIDE 0.9 % IV SOLN: 250.0000 mL | INTRAVENOUS | Status: DC | PRN

## 2021-02-28 MED ORDER — VERAPAMIL HCL 2.5 MG/ML IV SOLN
INTRAVENOUS | Status: AC
  Filled 2021-02-28: qty 2

## 2021-02-28 MED ORDER — HEPARIN SODIUM (PORCINE) 1000 UNIT/ML IJ SOLN
INTRAMUSCULAR | Status: AC
  Filled 2021-02-28: qty 1

## 2021-02-28 MED ORDER — IOHEXOL 350 MG/ML SOLN
INTRAVENOUS | Status: DC | PRN
  Administered 2021-02-28: 45 mL

## 2021-02-28 MED ORDER — FENTANYL CITRATE (PF) 100 MCG/2ML IJ SOLN
INTRAMUSCULAR | Status: AC
  Filled 2021-02-28: qty 2

## 2021-02-28 MED ORDER — MIDAZOLAM HCL 2 MG/2ML IJ SOLN
INTRAMUSCULAR | Status: DC | PRN
  Administered 2021-02-28: 2 mg via INTRAVENOUS

## 2021-02-28 MED ORDER — LABETALOL HCL 5 MG/ML IV SOLN: 10.0000 mg | INTRAVENOUS | Status: DC | PRN

## 2021-02-28 MED ORDER — ACETAMINOPHEN 325 MG PO TABS: 650.0000 mg | ORAL_TABLET | ORAL | Status: DC | PRN

## 2021-02-28 MED ORDER — ASPIRIN 81 MG PO CHEW: 81.0000 mg | CHEWABLE_TABLET | ORAL | Status: DC

## 2021-02-28 MED ORDER — MIDAZOLAM HCL 2 MG/2ML IJ SOLN
INTRAMUSCULAR | Status: AC
  Filled 2021-02-28: qty 2

## 2021-02-28 MED ORDER — ONDANSETRON HCL 4 MG/2ML IJ SOLN: 4.0000 mg | Freq: Four times a day (QID) | INTRAMUSCULAR | Status: DC | PRN

## 2021-02-28 MED ORDER — LIDOCAINE HCL (PF) 1 % IJ SOLN
INTRAMUSCULAR | Status: DC | PRN
  Administered 2021-02-28: 4 mL

## 2021-02-28 MED ORDER — SODIUM CHLORIDE 0.9 % WEIGHT BASED INFUSION
3.0000 mL/kg/h | INTRAVENOUS | Status: AC
  Administered 2021-02-28: 3 mL/kg/h via INTRAVENOUS

## 2021-02-28 MED ORDER — SODIUM CHLORIDE 0.9 % WEIGHT BASED INFUSION: 1.0000 mL/kg/h | INTRAVENOUS | Status: DC

## 2021-02-28 MED ORDER — HYDRALAZINE HCL 20 MG/ML IJ SOLN: 10.0000 mg | INTRAMUSCULAR | Status: DC | PRN

## 2021-02-28 MED ORDER — SODIUM CHLORIDE 0.9 % IV SOLN: INTRAVENOUS | Status: AC

## 2021-02-28 MED ORDER — SODIUM CHLORIDE 0.9% FLUSH: 3.0000 mL | Freq: Two times a day (BID) | INTRAVENOUS | Status: DC

## 2021-02-28 MED ORDER — HEPARIN (PORCINE) IN NACL 1000-0.9 UT/500ML-% IV SOLN
INTRAVENOUS | Status: AC
  Filled 2021-02-28: qty 1000

## 2021-02-28 MED ORDER — FENTANYL CITRATE (PF) 100 MCG/2ML IJ SOLN
INTRAMUSCULAR | Status: DC | PRN
  Administered 2021-02-28: 50 ug via INTRAVENOUS

## 2021-02-28 MED ORDER — VERAPAMIL HCL 2.5 MG/ML IV SOLN
INTRAVENOUS | Status: DC | PRN
  Administered 2021-02-28: 10 mL via INTRA_ARTERIAL

## 2021-02-28 MED ORDER — HEPARIN SODIUM (PORCINE) 1000 UNIT/ML IJ SOLN
INTRAMUSCULAR | Status: DC | PRN
  Administered 2021-02-28: 4500 [IU] via INTRAVENOUS

## 2021-02-28 MED ORDER — LIDOCAINE HCL (PF) 1 % IJ SOLN
INTRAMUSCULAR | Status: AC
  Filled 2021-02-28: qty 30

## 2021-02-28 SURGICAL SUPPLY — 12 items
BAG SNAP BAND KOVER 36X36 (MISCELLANEOUS) ×1 IMPLANT
CATH 5FR JL3.5 JR4 ANG PIG MP (CATHETERS) ×1 IMPLANT
CATH BALLN WEDGE 5F 110CM (CATHETERS) ×1 IMPLANT
DEVICE RAD COMP TR BAND LRG (VASCULAR PRODUCTS) ×1 IMPLANT
GLIDESHEATH SLEND SS 6F .021 (SHEATH) ×1 IMPLANT
GUIDEWIRE INQWIRE 1.5J.035X260 (WIRE) IMPLANT
INQWIRE 1.5J .035X260CM (WIRE) ×2
KIT HEART LEFT (KITS) ×2 IMPLANT
PACK CARDIAC CATHETERIZATION (CUSTOM PROCEDURE TRAY) ×2 IMPLANT
SHEATH GLIDE SLENDER 4/5FR (SHEATH) ×1 IMPLANT
TRANSDUCER W/STOPCOCK (MISCELLANEOUS) ×2 IMPLANT
TUBING CIL FLEX 10 FLL-RA (TUBING) ×2 IMPLANT

## 2021-02-28 NOTE — Interval H&P Note (Signed)
History and Physical Interval Note:  02/28/2021 8:15 AM  Kevin Barron  has presented today for surgery, with the diagnosis of aortic stenosis.  The various methods of treatment have been discussed with the patient and family. After consideration of risks, benefits and other options for treatment, the patient has consented to  Procedure(s): RIGHT/LEFT HEART CATH AND CORONARY ANGIOGRAPHY (N/A) as a surgical intervention.  The patient's history has been reviewed, patient examined, no change in status, stable for surgery.  I have reviewed the patient's chart and labs.  Questions were answered to the patient's satisfaction.    Cath Lab Visit (complete for each Cath Lab visit)  Clinical Evaluation Leading to the Procedure:   ACS: No.  Non-ACS:    Anginal Classification: CCS I  Anti-ischemic medical therapy: No Therapy  Non-Invasive Test Results: No non-invasive testing performed  Prior CABG: No previous CABG        Lauree Chandler

## 2021-02-28 NOTE — Progress Notes (Signed)
Pt ambulated without difficulty or bleeding.   Discharged home with his wife who will drive and stay with pt x 24 hrs.  Utilized Spanish interpreter to review AVS instructions with pt and wife.

## 2021-03-05 ENCOUNTER — Ambulatory Visit: Payer: Self-pay | Admitting: Physician Assistant

## 2021-03-14 ENCOUNTER — Telehealth: Payer: Self-pay | Admitting: *Deleted

## 2021-03-14 ENCOUNTER — Other Ambulatory Visit: Payer: Self-pay | Admitting: Cardiovascular Disease

## 2021-03-14 DIAGNOSIS — Q231 Congenital insufficiency of aortic valve: Secondary | ICD-10-CM

## 2021-03-14 NOTE — H&P (View-Only) (Signed)
[name]       [place] [address]             [phone]        Kevin Barron [facility] Medical Record [id number] Date of Birth: Aug 10, 1965  Referring: Soyla Dryer, PA-C Primary Care: Soyla Dryer, PA-C Primary Cardiologist:Samuel Domenic Polite, MD  Chief Complaint:   No chief complaint on file.   History of Present Illness:     55 yo male referred after TAVR eval for bicuspid aortic valve, and severe aortic stenosis.  He is experienced exertional dyspnea, and some chest pain over the last several months.  Recently he did have some presyncopal episodes.  He is undergone a left heart catheter did not show any significant coronary disease.   Past Medical and Surgical History: Previous Chest Surgery: No Previous Chest Radiation: No Diabetes Mellitus: yes.  HbA1C 6 Creatinine: 0.71  Past Medical History:  Diagnosis Date   Aortic stenosis    Hyperlipidemia    Obesity    Type 2 diabetes mellitus ([place])     Past Surgical History:  Procedure Laterality Date   LEG SURGERY Left    RIGHT/LEFT HEART CATH AND CORONARY ANGIOGRAPHY N/A 02/28/2021   Procedure: RIGHT/LEFT HEART CATH AND CORONARY ANGIOGRAPHY;  Surgeon: Burnell Blanks, MD;  Location: [facility];  Service: Cardiovascular;  Laterality: N/A;    Social History: Support: Presents with his wife of over 20 years.  Social History   Tobacco Use  Smoking Status Former   Packs/day: 1.00   Years: 14.00   Pack years: 14.00   Types: Cigarettes   Quit date: 07/08/1993   Years since quitting: 27.7  Smokeless Tobacco Never    Social History   Substance and Sexual Activity  Alcohol Use No   Comment: Quit drinking 2007. Pt would drink beer socailly     No Known Allergies   Current Outpatient Medications  Medication Sig Dispense Refill   atorvastatin (LIPITOR) 40 MG tablet Take 1 tablet (40 mg total) by mouth daily. Tome una tableta por boca diaria 30 tablet 3    Current Facility-Administered Medications  Medication Dose Route Frequency Provider Last Rate Last Admin   sodium chloride flush (NS) 0.9 % injection 3 mL  3 mL Intravenous Q12H Satira Sark, MD        (Not in a hospital admission)   Family History  Problem Relation Age of Onset   Asthma Mother    Diabetes Father    Hypertension Father      Review of Systems:   Review of Systems  Constitutional:  Positive for malaise/fatigue.  Respiratory:  Positive for shortness of breath.   Cardiovascular:  Positive for chest pain. Negative for leg swelling.  Neurological:  Positive for dizziness.     Physical Exam: There were no vitals taken for this visit. Physical Exam Constitutional:      General: He is not in acute distress.    Appearance: Normal appearance. He is obese. He is not ill-appearing.  Eyes:     Extraocular Movements: Extraocular movements intact.  Cardiovascular:     Rate and Rhythm: Normal rate.     Heart sounds: Murmur heard.  Pulmonary:     Effort: Pulmonary effort is normal. No respiratory distress.  Abdominal:     General: There is no distension.  Musculoskeletal:        General: Normal range of motion.     Cervical back: Normal range  of motion.  Skin:    General: Skin is warm.  Neurological:     General: No focal deficit present.     Mental Status: He is alert and oriented to person, place, and time.      Diagnostic Studies & Laboratory data:    Left Heart Catherization: Non-obstructive CAD Severe AS  Echo: IMPRESSIONS     1. Mitral inflow not performed . Left ventricular ejection fraction, by  estimation, is 55 to 60%. The left ventricle has normal function. The left  ventricle has no regional wall motion abnormalities. There is mild left  ventricular hypertrophy. Left  ventricular diastolic parameters are indeterminate.   2. Right ventricular systolic function is normal. The right ventricular  size is normal.   3. The mitral  valve is normal in structure. No evidence of mitral valve  regurgitation. No evidence of mitral stenosis.   4. Gradients lower than measured 04/24/20 but AS still severe AVA  calculates much lower as LVOT used on this study 1.6 vs 2.2 on previous  DVI gone from 0.21-> 0.25 AV appears tricuspid but cannot r/o bicupid with  partial fusion of left and right cusps .   The aortic valve is tricuspid. There is severe calcifcation of the aortic  valve. There is severe thickening of the aortic valve. Aortic valve  regurgitation is mild to moderate. Severe aortic valve stenosis.   5. The inferior vena cava is normal in size with greater than 50%  respiratory variability, suggesting right atrial pressure of 3 mmHg.   I have independently reviewed the above radiologic studies and discussed with the patient   Recent Lab Findings: Lab Results  Component Value Date   WBC 9.0 02/21/2021   HGB 12.9 (L) 02/28/2021   HCT 38.0 (L) 02/28/2021   PLT 264 02/21/2021   GLUCOSE 122 (H) 02/21/2021   CHOL 200 11/06/2020   TRIG 69 11/06/2020   HDL 41 11/06/2020   LDLCALC 145 (H) 11/06/2020   ALT 32 11/06/2020   AST 23 11/06/2020   NA 142 02/28/2021   K 3.9 02/28/2021   CL 102 02/21/2021   CREATININE 0.71 02/21/2021   BUN 17 02/21/2021   CO2 34 (H) 02/21/2021   TSH 1.544 05/12/2017   HGBA1C 6.0 (H) 11/06/2020      Assessment / Plan:   55 yo male with severe AS, bicuspid valve.  He had a very long discussion via an interpreter about the risks and benefits of aortic valve replacement.  He is agreeable to proceed and would like to undergo a mechanical valve replacement.  He still requires a CT aortogram to evaluate for aortic aneurysms, as well as dental clearance.  He is tentatively scheduled for the next few weeks for aortic valve replacement.     I  spent 40 minutes counseling the patient face to face.   Lajuana Matte 03/14/2021 8:04 AM

## 2021-03-14 NOTE — Progress Notes (Signed)
[address]       [place] [address]             [phone]        Moo Hernandez-Garcia [place] Medical Record [id number] Date of Birth: [birth year]  Referring: Soyla Dryer, PA-C Primary Care: Soyla Dryer, PA-C Primary Cardiologist:Samuel Domenic Polite, MD  Chief Complaint:   No chief complaint on file.   History of Present Illness:     55 yo male referred after TAVR eval for bicuspid aortic valve, and severe aortic stenosis.  He is experienced exertional dyspnea, and some chest pain over the last several months.  Recently he did have some presyncopal episodes.  He is undergone a left heart catheter did not show any significant coronary disease.   Past Medical and Surgical History: Previous Chest Surgery: No Previous Chest Radiation: No Diabetes Mellitus: yes.  HbA1C 6 Creatinine: 0.71  Past Medical History:  Diagnosis Date   Aortic stenosis    Hyperlipidemia    Obesity    Type 2 diabetes mellitus ([place])     Past Surgical History:  Procedure Laterality Date   LEG SURGERY Left    RIGHT/LEFT HEART CATH AND CORONARY ANGIOGRAPHY N/A 02/28/2021   Procedure: RIGHT/LEFT HEART CATH AND CORONARY ANGIOGRAPHY;  Surgeon: Burnell Blanks, MD;  Location: [facility];  Service: Cardiovascular;  Laterality: N/A;    Social History: Support: Presents with his wife of over 20 years.  Social History   Tobacco Use  Smoking Status Former   Packs/day: 1.00   Years: 14.00   Pack years: 14.00   Types: Cigarettes   Quit date: 07/08/1993   Years since quitting: 27.7  Smokeless Tobacco Never    Social History   Substance and Sexual Activity  Alcohol Use No   Comment: Quit drinking 2007. Pt would drink beer socailly     No Known Allergies   Current Outpatient Medications  Medication Sig Dispense Refill   atorvastatin (LIPITOR) 40 MG tablet Take 1 tablet (40 mg total) by mouth daily. Tome una tableta por boca diaria 30 tablet 3    Current Facility-Administered Medications  Medication Dose Route Frequency Provider Last Rate Last Admin   sodium chloride flush (NS) 0.9 % injection 3 mL  3 mL Intravenous Q12H Satira Sark, MD        (Not in a hospital admission)   Family History  Problem Relation Age of Onset   Asthma Mother    Diabetes Father    Hypertension Father      Review of Systems:   Review of Systems  Constitutional:  Positive for malaise/fatigue.  Respiratory:  Positive for shortness of breath.   Cardiovascular:  Positive for chest pain. Negative for leg swelling.  Neurological:  Positive for dizziness.     Physical Exam: There were no vitals taken for this visit. Physical Exam Constitutional:      General: He is not in acute distress.    Appearance: Normal appearance. He is obese. He is not ill-appearing.  Eyes:     Extraocular Movements: Extraocular movements intact.  Cardiovascular:     Rate and Rhythm: Normal rate.     Heart sounds: Murmur heard.  Pulmonary:     Effort: Pulmonary effort is normal. No respiratory distress.  Abdominal:     General: There is no distension.  Musculoskeletal:        General: Normal range of motion.     Cervical back: Normal range  of motion.  Skin:    General: Skin is warm.  Neurological:     General: No focal deficit present.     Mental Status: He is alert and oriented to person, place, and time.      Diagnostic Studies & Laboratory data:    Left Heart Catherization: Non-obstructive CAD Severe AS  Echo: IMPRESSIONS     1. Mitral inflow not performed . Left ventricular ejection fraction, by  estimation, is 55 to 60%. The left ventricle has normal function. The left  ventricle has no regional wall motion abnormalities. There is mild left  ventricular hypertrophy. Left  ventricular diastolic parameters are indeterminate.   2. Right ventricular systolic function is normal. The right ventricular  size is normal.   3. The mitral  valve is normal in structure. No evidence of mitral valve  regurgitation. No evidence of mitral stenosis.   4. Gradients lower than measured 04/24/20 but AS still severe AVA  calculates much lower as LVOT used on this study 1.6 vs 2.2 on previous  DVI gone from 0.21-> 0.25 AV appears tricuspid but cannot r/o bicupid with  partial fusion of left and right cusps .   The aortic valve is tricuspid. There is severe calcifcation of the aortic  valve. There is severe thickening of the aortic valve. Aortic valve  regurgitation is mild to moderate. Severe aortic valve stenosis.   5. The inferior vena cava is normal in size with greater than 50%  respiratory variability, suggesting right atrial pressure of 3 mmHg.   I have independently reviewed the above radiologic studies and discussed with the patient   Recent Lab Findings: Lab Results  Component Value Date   WBC 9.0 02/21/2021   HGB 12.9 (L) 02/28/2021   HCT 38.0 (L) 02/28/2021   PLT 264 02/21/2021   GLUCOSE 122 (H) 02/21/2021   CHOL 200 11/06/2020   TRIG 69 11/06/2020   HDL 41 11/06/2020   LDLCALC 145 (H) 11/06/2020   ALT 32 11/06/2020   AST 23 11/06/2020   NA 142 02/28/2021   K 3.9 02/28/2021   CL 102 02/21/2021   CREATININE 0.71 02/21/2021   BUN 17 02/21/2021   CO2 34 (H) 02/21/2021   TSH 1.544 05/12/2017   HGBA1C 6.0 (H) 11/06/2020      Assessment / Plan:   55 yo male with severe AS, bicuspid valve.  He had a very long discussion via an interpreter about the risks and benefits of aortic valve replacement.  He is agreeable to proceed and would like to undergo a mechanical valve replacement.  He still requires a CT aortogram to evaluate for aortic aneurysms, as well as dental clearance.  He is tentatively scheduled for the next few weeks for aortic valve replacement.     I  spent 40 minutes counseling the patient face to face.   Lajuana Matte 03/14/2021 8:04 AM

## 2021-03-14 NOTE — Telephone Encounter (Signed)
Order placed for chest cta aorta. [facility] [place].  There is no availability between now and Friday morning before his consult with Dr. Kipp Brood.   Message to CT Dept at [facility] location to see if can be done here.

## 2021-03-15 NOTE — Telephone Encounter (Signed)
Spoke with Lattie Haw in [place].  Able to scan pt today at 11:15.  Called pt via [address] [place] [phone]).  A message was left for patient to call back to schedule CT scan.  Sent MyChart message asking for him to reply to arrange.

## 2021-03-16 ENCOUNTER — Other Ambulatory Visit: Payer: Self-pay | Admitting: Thoracic Surgery (Cardiothoracic Vascular Surgery)

## 2021-03-16 ENCOUNTER — Encounter: Payer: Self-pay | Admitting: Thoracic Surgery (Cardiothoracic Vascular Surgery)

## 2021-03-16 ENCOUNTER — Other Ambulatory Visit: Payer: Self-pay | Admitting: *Deleted

## 2021-03-16 ENCOUNTER — Other Ambulatory Visit: Payer: Self-pay

## 2021-03-16 ENCOUNTER — Institutional Professional Consult (permissible substitution) (INDEPENDENT_AMBULATORY_CARE_PROVIDER_SITE_OTHER): Payer: Self-pay | Admitting: Thoracic Surgery (Cardiothoracic Vascular Surgery)

## 2021-03-16 ENCOUNTER — Encounter: Payer: Self-pay | Admitting: *Deleted

## 2021-03-16 VITALS — BP 116/74 | HR 83 | Resp 20 | Ht 66.0 in

## 2021-03-16 DIAGNOSIS — I350 Nonrheumatic aortic (valve) stenosis: Secondary | ICD-10-CM

## 2021-03-16 DIAGNOSIS — I712 Thoracic aortic aneurysm, without rupture, unspecified: Secondary | ICD-10-CM

## 2021-03-19 ENCOUNTER — Ambulatory Visit (INDEPENDENT_AMBULATORY_CARE_PROVIDER_SITE_OTHER): Payer: Self-pay | Admitting: Dentistry

## 2021-03-19 ENCOUNTER — Encounter (HOSPITAL_COMMUNITY): Payer: Self-pay | Admitting: Dentistry

## 2021-03-19 ENCOUNTER — Other Ambulatory Visit: Payer: Self-pay

## 2021-03-19 DIAGNOSIS — K036 Deposits [accretions] on teeth: Secondary | ICD-10-CM

## 2021-03-19 DIAGNOSIS — M270 Developmental disorders of jaws: Secondary | ICD-10-CM

## 2021-03-19 DIAGNOSIS — M264 Malocclusion, unspecified: Secondary | ICD-10-CM

## 2021-03-19 DIAGNOSIS — K0510 Chronic gingivitis, plaque induced: Secondary | ICD-10-CM

## 2021-03-19 DIAGNOSIS — K08109 Complete loss of teeth, unspecified cause, unspecified class: Secondary | ICD-10-CM

## 2021-03-19 DIAGNOSIS — I350 Nonrheumatic aortic (valve) stenosis: Secondary | ICD-10-CM

## 2021-03-19 DIAGNOSIS — K029 Dental caries, unspecified: Secondary | ICD-10-CM

## 2021-03-19 DIAGNOSIS — M278 Other specified diseases of jaws: Secondary | ICD-10-CM

## 2021-03-19 DIAGNOSIS — Z01818 Encounter for other preprocedural examination: Secondary | ICD-10-CM

## 2021-03-19 DIAGNOSIS — K030 Excessive attrition of teeth: Secondary | ICD-10-CM

## 2021-03-19 NOTE — Patient Instructions (Signed)
[facility] Department of Dental Medicine Zarielle Cea B. Vidhi Delellis, D.M.D. Phone: [phone] Fax: [phone]   It was a pleasure seeing you today!  Please refer to the information below regarding your dental visit with us.  Call us if any questions or concerns come up after you leave.   Thank you for letting us provide care for you.  If there is anything we can do for you, please let us know.    HEART VALVES AND MOUTH CARE   FACTS: If you have any infection in your mouth, it can infect your heart valve. If you heart valve is infected, you will be seriously ill. Infections in the mouth can be SILENT and do not always cause pain. Examples of infections in the mouth are gum disease, dental cavities, and abscesses. Some possible signs of infection are: Bad breath, bleeding gums, or teeth that are sensitive to sweets, hot, and/or cold. There are many other signs as well.   WHAT YOU HAVE TO DO: Brush your teeth after meals and at bedtime.  Spend at least 2 minutes brushing well, especially behind your back teeth and all around your teeth that stand alone.  Brush at the gumline also. Do not go to bed without brushing your teeth and flossing. If your gums bleed when you brush or floss, do NOT stop brushing or flossing.  Bleeding can be a sign of inflammation or irritation from bacteria.  It usually means that your gums need more attention and better cleaning.  If your dentist or Dr. Teofila Bowery gave you a prescription mouthwash to use, make sure to use it as directed. If you run out of the medication, get a refill at the pharmacy. If you were given any other medications or directions by your dentist, please follow them.  If you did not understand the directions or forget what you were told, please call.  We will be happy to refresh your memory. If you need antibiotics before dental procedures, make sure you take them one hour prior to every dental visit as directed.  Get a dental check-up every 4-6  months in order to keep your mouth healthy, or to find and treat any new infection. You will most likely need your teeth cleaned or gums treated at the same time. If you are not able to come in for your scheduled appointment, call your dentist as soon as possible to reschedule. If you have a problem in between dental visits, call your dentist.   QUESTIONS? Call our office during office hours [phone].    WE ARE A TEAM.  OUR GOAL IS:  HEALTHY MOUTH, HEALTHY HEART   

## 2021-03-19 NOTE — Progress Notes (Signed)
Department of Dental Medicine          OUTPATIENT CONSULT   Service Date:   03/19/2021  Patient Name:   Knolan Falzon Date of Birth:   Aug 21, 1965 Medical Record Number: [id number]  Referring Provider:                Melodie Bouillon, M.D.   >  Plan/Recommendations <   Assessment There are no current signs of acute odontogenic infection including abscess, edema or erythema, or suspicious lesion requiring biopsy.   There is generalized attrition, some teeth with severe wear, caries and generalized calculus build-up.  Recommendations No dental intervention indicated prior to cardiac surgery at this time.  Establish dental care at an outside office of the patient's choice for routine care including cleanings/periodontal therapy and periodic exams.  Plan Discuss case with medical team. Return status post heart surgery and recovery for restorative treatment and cleaning. Call if any questions or concerns arise.  Discussed in detail all treatment options and recommendations with the patient and they are agreeable to the plan.    Thank you for consulting with Hospital Dent[facility] and for the opportunity to participate in this patient's treatment.  Should you have any questions or concerns, please contact the [facility] at [phone].   03/19/2021 Consult Note:       An interpreter was used for the duration of today's visit.  COVID-19 SCREENING:  The patient denies symptoms concerning for COVID-19 infection including fever, chills, cough, or newly developed shortness of breath.   HISTORY OF PRESENT ILLNESS: Gal Lefton is a very pleasant 55 y.o. male with h/o hyperlipidemia, type 2 diabetes mellitus, obesity, heart murmur and hypotension who was recently diagnosed with aortic stenosis and is anticipating AVR.  The patient presents today for a medically necessary dental consultation as part of their pre-cardiac surgery work-up.   DENTAL  HISTORY: The patient reports that it has been a very long time since he has last seen a dentist.  He currently denies any dental/orofacial pain or sensitivity. He is eager to complete his work-up required to have his heart surgery. Patient is able to manage oral secretions.  Patient denies dysphagia, odynophagia, dysphonia, SOB and neck pain.  Patient denies fever, rigors and malaise.   CHIEF COMPLAINT:  Here for a preoperative dental exam.   Patient Active Problem List   Diagnosis Date Noted   Near syncope 03/24/2014   Hypotension 03/24/2014   Aortic stenosis 05/24/2013   Chest pain 10/26/2012   Murmur 10/26/2012   Past Medical History:  Diagnosis Date   Aortic stenosis    Hyperlipidemia    Obesity    Type 2 diabetes mellitus ([place])    Past Surgical History:  Procedure Laterality Date   LEG SURGERY Left    RIGHT/LEFT HEART CATH AND CORONARY ANGIOGRAPHY N/A 02/28/2021   Procedure: RIGHT/LEFT HEART CATH AND CORONARY ANGIOGRAPHY;  Surgeon: Burnell Blanks, MD;  Location: [facility];  Service: Cardiovascular;  Laterality: N/A;   No Known Allergies Current Outpatient Medications  Medication Sig Dispense Refill   atorvastatin (LIPITOR) 40 MG tablet Take 1 tablet (40 mg total) by mouth daily. Tome una tableta por boca diaria 30 tablet 3   Current Facility-Administered Medications  Medication Dose Route Frequency Provider Last Rate Last Admin   sodium chloride flush (NS) 0.9 % injection 3 mL  3 mL Intravenous Q12H Satira Sark, MD        LABS:  Lab Results  Component Value Date   WBC 9.0 02/21/2021   HGB 12.9 (L) 02/28/2021   HCT 38.0 (L) 02/28/2021   MCV 93.7 02/21/2021   PLT 264 02/21/2021      Component Value Date/Time   NA 142 02/28/2021 0947   K 3.9 02/28/2021 0947   CL 102 02/21/2021 0928   CO2 34 (H) 02/21/2021 0928   GLUCOSE 122 (H) 02/21/2021 0928   BUN 17 02/21/2021 0928   CREATININE 0.71 02/21/2021 0928   CALCIUM 8.3 (L) 02/21/2021 0928    GFRNONAA >60 02/21/2021 0928   GFRAA >60 03/27/2020 1007   No results found for: INR, PROTIME No results found for: PTT  Social History   Socioeconomic History   Marital status: Married    Spouse name: Not on file   Number of children: Not on file   Years of education: Not on file   Highest education level: Not on file  Occupational History   Occupation: Dealer  Tobacco Use   Smoking status: Former    Packs/day: 1.00    Years: 14.00    Pack years: 14.00    Types: Cigarettes    Quit date: 07/08/1993    Years since quitting: 27.7   Smokeless tobacco: Never  Vaping Use   Vaping Use: Never used  Substance and Sexual Activity   Alcohol use: No    Comment: Quit drinking 2007. Pt would drink beer socailly   Drug use: No   Sexual activity: Yes  Other Topics Concern   Not on file  Social History Narrative   ** Merged History Encounter **       Social Determinants of Health   Financial Resource Strain: Not on file  Food Insecurity: Not on file  Transportation Needs: Not on file  Physical Activity: Not on file  Stress: Not on file  Social Connections: Not on file  Intimate Partner Violence: Not on file   Family History  Problem Relation Age of Onset   Asthma Mother    Diabetes Father    Hypertension Father      REVIEW OF SYSTEMS:  Reviewed with the patient as per HPI. Psych: Patient denies having dental phobia.   VITAL SIGNS: BP 121/66 (BP Location: Right Arm, Patient Position: Sitting, Cuff Size: Normal)   Pulse 62   Temp 98.3 F (36.8 C) (Oral)    PHYSICAL EXAM: General:  Well-developed, comfortable and in no apparent distress. Neurological:  Alert and oriented to person, place and  time. Extraoral:  Facial symmetry present without any edema or erythema.  No swelling or lymphadenopathy.  TMJ asymptomatic without clicks or crepitations. Intraoral:  Soft tissues appear well-perfused and mucous membranes moist.  FOM and vestibules soft and not raised.  Oral cavity without mass or lesion. No signs of infection, parulis, sinus tract, edema or erythema evident upon exam.   (+) Bilateral mandibular tori (+) Maxillary exostoses   DENTAL EXAM: Hard tissue exam completed and charted. Overall impression:  Fair remaining dentition. Oral hygiene:  Fair    Periodontal:  Pink, healthy gingival tissue with blunted papilla.  Generalized calculus accumulation. Caries:  #12, #13, #31 Occlusion:  Unable to assess molar occlusion. Class II malocclusion with anterior overjet and overbite. Non-functional tooth #30. Other findings:   (+) Attrition/wear- generalized attrition on occlusal/incisal surfaces of all teeth; severe wear on teeth numbers 18DOBL and 30MOBL surfaces.   RADIOGRAPHIC EXAM:  PAN and Full Mouth Series exposed and interpreted.  Condyles seated bilaterally in fossas.  No evidence of abnormal pathology.  All visualized osseous structures appear WNL. #2 and #18 drifting towards mesial.  Localized mild horizontal bone loss consistent with mild periodontitis.  Radiographic calculus accumulation evident. Missing teeth. #8D incipient lesion and #13D caries. #18 and #30 fractured/broken teeth close to >50% of coronal tooth structure.   ASSESSMENT:  1.  Severe aortic stenosis 2.  Preoperative dental exam 3.  Missing teeth 4.  Caries 5.  Accretions on teeth 6.  Gingivitis 7.  Attrition/wear 8.  Malocclusion 9.  Mandibular tori 10.  Maxillary exostoses   PLAN AND RECOMMENDATIONS: I discussed the risks, benefits, and complications of various scenarios with the patient in relationship to their medical and dental conditions, which included systemic infection such as endocarditis, bacteremia or other serious issues that could potentially occur either before, during or after their anticipated heart surgery if dental/oral concerns are not addressed.  I explained that if any chronic or acute dental/oral infection(s) are addressed and  subsequently not maintained following medical optimization and recovery, their risk of the previously mentioned complications are just as high and could potentially occur postoperatively.  I explained all significant findings of the dental consultation with the patient including several teeth with cavities, severe and excessive/generalized wear of his remaining dentition with teeth #18 and #30 approaching the nerve of his tooth and generalized calculus or tartar accumulation of his remaining dentition, and the recommended care including finding a primary dentist for routine dental care including cleanings every 6 months, restorative/fillings and periodic exams in order to optimize them following heart surgery from a dental standpoint.  The patient verbalized understanding of all findings, discussion, and recommendations. We then discussed various treatment options to include no treatment, multiple extractions with alveoloplasty, pre-prosthetic surgery as indicated, periodontal therapy, dental restorations, root canal therapy, crown and bridge therapy, implant therapy, and replacement of missing teeth as indicated.  The patient verbalized understanding of all options.  I discussed with the patient that until he is able to find a regular dentist, we are happy to see him once he recovers from his surgery to complete restorative treatment on teeth  and a cleaning.  He is agreeable to this plan and elects to schedule a return dental appointment once he is medically optimized s/p surgery.  He will need prophylactic antibiotics as well. Plan to discuss all findings and recommendations with medical team and coordinate future care as needed.    All questions and concerns were invited and addressed.  The patient tolerated today's visit well and departed in stable condition.  I spent in excess of 120 minutes during the conduct of this consultation and >50% of this time involved direct face-to-face encounter for  counseling and/or coordination of the patient's care. [place], D.M.D.

## 2021-03-21 ENCOUNTER — Ambulatory Visit
Admission: RE | Admit: 2021-03-21 | Discharge: 2021-03-21 | Disposition: A | Discharge status: Home / Self Care | Payer: Self-pay | Source: Ambulatory Visit | Attending: Thoracic Surgery (Cardiothoracic Vascular Surgery) | Admitting: Thoracic Surgery (Cardiothoracic Vascular Surgery)

## 2021-03-21 DIAGNOSIS — I712 Thoracic aortic aneurysm, without rupture, unspecified: Secondary | ICD-10-CM

## 2021-03-21 MED ORDER — IOPAMIDOL (ISOVUE-370) INJECTION 76%
75.0000 mL | Freq: Once | INTRAVENOUS | Status: AC | PRN
  Administered 2021-03-21: 75 mL via INTRAVENOUS

## 2021-03-28 NOTE — Progress Notes (Signed)
Spanish Interpreter for PAT appointment ( 03/29/2021 @ 14:00) was scheduled.

## 2021-03-28 NOTE — Progress Notes (Signed)
Surgical Instructions    Your procedure is scheduled on Monday, September 26th, 2022.   Report to [facility] Main Entrance "A" at 05:30 A.M., then check in with the Admitting office.  Call this number if you have problems the morning of surgery:  [phone]   If you have any questions prior to your surgery date call [phone]: Open Monday-Friday 8am-4pm    Remember:  Do not eat or drink after midnight the night before your surgery    Take these medicines the morning of surgery with A SIP OF WATER - none  As of today, STOP taking any Aspirin (unless otherwise instructed by your surgeon) Aleve, Naproxen, Ibuprofen, Motrin, Advil, Goody's, BC's, all herbal medications, fish oil, and all vitamins.          Do not wear jewelry  Do not wear lotions, powders, colognes, or deodorant. Men may shave face and neck. Do not bring valuables to the hospital.             [facility] is not responsible for any belongings or valuables.  Do NOT Smoke (Tobacco/Vaping)  24 hours prior to your procedure If you use a CPAP at night, you may bring your mask for your overnight stay.   Contacts, glasses, dentures or bridgework may not be worn into surgery, please bring cases for these belongings   For patients admitted to the hospital, discharge time will be determined by your treatment team.   Patients discharged the day of surgery will not be allowed to drive home, and someone needs to stay with them for 24 hours.  NO VISITORS WILL BE ALLOWED IN PRE-OP WHERE PATIENTS GET READY FOR SURGERY.  ONLY 1 SUPPORT PERSON MAY BE PRESENT WHILE YOU ARE IN SURGERY.  IF YOU ARE TO BE ADMITTED, ONCE YOU ARE IN YOUR ROOM YOU WILL BE ALLOWED TWO (2) VISITORS.  Minor children may have two parents present. Special consideration for safety and communication needs will be reviewed on a case by case basis.  Special instructions:    Oral Hygiene is also important to reduce your risk of infection.  Remember - BRUSH YOUR  TEETH THE MORNING OF SURGERY WITH YOUR REGULAR TOOTHPASTE   [facility]- Preparing For Surgery  Before surgery, you can play an important role. Because skin is not sterile, your skin needs to be as free of germs as possible. You can reduce the number of germs on your skin by washing with CHG (chlorahexidine gluconate) Soap before surgery.  CHG is an antiseptic cleaner which kills germs and bonds with the skin to continue killing germs even after washing.     Please do not use if you have an allergy to CHG or antibacterial soaps. If your skin becomes reddened/irritated stop using the CHG.  Do not shave (including legs and underarms) for at least 48 hours prior to first CHG shower. It is OK to shave your face.  Please follow these instructions carefully.     Shower the NIGHT BEFORE SURGERY and the MORNING OF SURGERY with CHG Soap.   If you chose to wash your hair, wash your hair first as usual with your normal shampoo. After you shampoo, rinse your hair and body thoroughly to remove the shampoo.  Then [employer] and genitals (private parts) with your normal soap and rinse thoroughly to remove soap.  After that Use CHG Soap as you would any other liquid soap. You can apply CHG directly to the skin and wash gently with a scrungie  or a clean washcloth.   Apply the CHG Soap to your body ONLY FROM THE NECK DOWN.  Do not use on open wounds or open sores. Avoid contact with your eyes, ears, mouth and genitals (private parts). Wash Face and genitals (private parts)  with your normal soap.   Wash thoroughly, paying special attention to the area where your surgery will be performed.  Thoroughly rinse your body with warm water from the neck down.  DO NOT shower/wash with your normal soap after using and rinsing off the CHG Soap.  Pat yourself dry with a CLEAN TOWEL.  Wear CLEAN PAJAMAS to bed the night before surgery  Place CLEAN SHEETS on your bed the night before your surgery  DO NOT SLEEP WITH  PETS.   Day of Surgery:  Take a shower with CHG soap. Wear Clean/Comfortable clothing the morning of surgery Do not apply any deodorants/lotions.   Remember to brush your teeth WITH YOUR REGULAR TOOTHPASTE.   Please read over the following fact sheets that you were given.

## 2021-03-29 ENCOUNTER — Encounter (HOSPITAL_COMMUNITY)
Admission: RE | Admit: 2021-03-29 | Discharge: 2021-03-29 | Disposition: A | Discharge status: Home / Self Care | Payer: Self-pay | Source: Ambulatory Visit | Attending: Thoracic Surgery (Cardiothoracic Vascular Surgery) | Admitting: Thoracic Surgery (Cardiothoracic Vascular Surgery)

## 2021-03-29 ENCOUNTER — Encounter (HOSPITAL_COMMUNITY): Payer: Self-pay

## 2021-03-29 ENCOUNTER — Ambulatory Visit (HOSPITAL_COMMUNITY)
Admission: RE | Admit: 2021-03-29 | Discharge: 2021-03-29 | Disposition: A | Discharge status: Home / Self Care | Payer: Self-pay | Source: Ambulatory Visit | Attending: Thoracic Surgery (Cardiothoracic Vascular Surgery) | Admitting: Thoracic Surgery (Cardiothoracic Vascular Surgery)

## 2021-03-29 ENCOUNTER — Other Ambulatory Visit: Payer: Self-pay

## 2021-03-29 DIAGNOSIS — I350 Nonrheumatic aortic (valve) stenosis: Secondary | ICD-10-CM | POA: Insufficient documentation

## 2021-03-29 DIAGNOSIS — E119 Type 2 diabetes mellitus without complications: Secondary | ICD-10-CM | POA: Insufficient documentation

## 2021-03-29 DIAGNOSIS — Z20822 Contact with and (suspected) exposure to covid-19: Secondary | ICD-10-CM | POA: Insufficient documentation

## 2021-03-29 DIAGNOSIS — E785 Hyperlipidemia, unspecified: Secondary | ICD-10-CM | POA: Insufficient documentation

## 2021-03-29 DIAGNOSIS — Z01818 Encounter for other preprocedural examination: Secondary | ICD-10-CM

## 2021-03-29 HISTORY — DX: Angina pectoris, unspecified: I20.9

## 2021-03-29 LAB — BLOOD GAS, ARTERIAL
Acid-Base Excess: 0.4 mmol/L (ref 0.0–2.0)
Bicarbonate: 24.5 mmol/L (ref 20.0–28.0)
Drawn by: 58793
FIO2: 21
O2 Saturation: 97.4 %
Patient temperature: 37
pCO2 arterial: 40.1 mmHg (ref 32.0–48.0)
pH, Arterial: 7.403 (ref 7.350–7.450)
pO2, Arterial: 93.7 mmHg (ref 83.0–108.0)

## 2021-03-29 LAB — COMPREHENSIVE METABOLIC PANEL
ALT: 31 U/L (ref 0–44)
AST: 33 U/L (ref 15–41)
Albumin: 3.8 g/dL (ref 3.5–5.0)
Alkaline Phosphatase: 49 U/L (ref 38–126)
Anion gap: 8 (ref 5–15)
BUN: 20 mg/dL (ref 6–20)
CO2: 24 mmol/L (ref 22–32)
Calcium: 8.7 mg/dL — ABNORMAL LOW (ref 8.9–10.3)
Chloride: 103 mmol/L (ref 98–111)
Creatinine, Ser: 0.7 mg/dL (ref 0.61–1.24)
GFR, Estimated: >60 mL/min (ref >60)
Glucose, Bld: 100 mg/dL — ABNORMAL HIGH (ref 70–99)
Potassium: 3.9 mmol/L (ref 3.5–5.1)
Sodium: 135 mmol/L (ref 135–145)
Total Bilirubin: 0.9 mg/dL (ref 0.3–1.2)
Total Protein: 6.9 g/dL (ref 6.5–8.1)

## 2021-03-29 LAB — URINALYSIS, ROUTINE W REFLEX MICROSCOPIC
Bilirubin Urine: NEGATIVE
Glucose, UA: NEGATIVE mg/dL
Hgb urine dipstick: NEGATIVE
Ketones, ur: NEGATIVE mg/dL
Leukocytes,Ua: NEGATIVE
Nitrite: NEGATIVE
Protein, ur: NEGATIVE mg/dL
Specific Gravity, Urine: 1.025 (ref 1.005–1.030)
pH: 7 (ref 5.0–8.0)

## 2021-03-29 LAB — GLUCOSE, CAPILLARY: Glucose-Capillary: 111 mg/dL — ABNORMAL HIGH (ref 70–99)

## 2021-03-29 LAB — CBC
HCT: 41.9 % (ref 39.0–52.0)
Hemoglobin: 14.5 g/dL (ref 13.0–17.0)
MCH: 31.8 pg (ref 26.0–34.0)
MCHC: 34.6 g/dL (ref 30.0–36.0)
MCV: 91.9 fL (ref 80.0–100.0)
Platelets: 255 10*3/uL (ref 150–400)
RBC: 4.56 MIL/uL (ref 4.22–5.81)
RDW: 13.4 % (ref 11.5–15.5)
WBC: 7.9 10*3/uL (ref 4.0–10.5)
nRBC: 0 % (ref 0.0–0.2)

## 2021-03-29 LAB — TYPE AND SCREEN
ABO/RH(D): A POS
Antibody Screen: NEGATIVE

## 2021-03-29 LAB — SURGICAL PCR SCREEN
MRSA, PCR: NEGATIVE
Staphylococcus aureus: POSITIVE — AB

## 2021-03-29 LAB — PROTIME-INR
INR: 1.2 (ref 0.8–1.2)
Prothrombin Time: 15.3 seconds — ABNORMAL HIGH (ref 11.4–15.2)

## 2021-03-29 LAB — APTT: aPTT: 33 seconds (ref 24–36)

## 2021-03-29 LAB — HEMOGLOBIN A1C
Hgb A1c MFr Bld: 6.2 % — ABNORMAL HIGH (ref 4.8–5.6)
Mean Plasma Glucose: 131.24 mg/dL

## 2021-03-29 LAB — SARS CORONAVIRUS 2 (TAT 6-24 HRS): SARS Coronavirus 2: NEGATIVE

## 2021-03-29 NOTE — Progress Notes (Signed)
PCP - [facility] [place] Cardiologist - unknown  PPM/ICD - denies Device Orders - n/a Rep Notified - n/a  Chest x-ray - 03/29/2021 EKG - 03/29/2021 Stress Test - 05/26/2013 ECHO - 02/12/2021 Cardiac Cath - 02/28/2021  Sleep Study - denies CPAP - n/a  Fasting Blood Sugar - patient verbalized that he doesn't have diabetes. He is not checking CBG at home CBG today - 111 A1C done in PAT 03/29/2021  Blood Thinner Instructions: n/a Aspirin Instructions: Aspirin - patient will continue to take Aspirin until day of surgery.  Patient was instructed: As of today, STOP taking any Aspirin (unless otherwise instructed by your surgeon) Aleve, Naproxen, Ibuprofen, Motrin, Advil, Goody's, BC's, all herbal medications, fish oil, and all vitamins.  ERAS Protcol - no  COVID TEST- done in PAT on 03/29/2021  Anesthesia review: yes - cardiac history  Patient denies shortness of breath, fever, cough and chest pain at PAT appointment   All instructions explained to the patient, with a verbal understanding of the material. Patient agrees to go over the instructions while at home for a better understanding. Patient also instructed to self quarantine after being tested for COVID-19. The opportunity to ask questions was provided.

## 2021-03-29 NOTE — Progress Notes (Signed)
Interpreter service was called to schedule a spanish interpreter for Monday, 04/02/2021 @ 05:30. Also, an e-mail was send to [email or web address]

## 2021-03-29 NOTE — Progress Notes (Addendum)
Surgical Instructions    Your procedure is scheduled on Monday, September 26th, 2022.   Report to [facility] Main Entrance "A" at 05:30 A.M., then check in with the Admitting office.  Call this number if you have problems the morning of surgery:  [phone]   If you have any questions prior to your surgery date call [phone]: Open Monday-Friday 8am-4pm    Remember:  Do not eat or drink after midnight the night before your surgery    Take these medicines the morning of surgery with A SIP OF WATER - none  Continue to take Aspirin until the day of surgery. Do not take Aspirin the day of surgery, per MD.  As of today, STOP taking any Aspirin (unless otherwise instructed by your surgeon) Aleve, Naproxen, Ibuprofen, Motrin, Advil, Goody's, BC's, all herbal medications, fish oil, and all vitamins.          Do not wear jewelry  Do not wear lotions, powders, colognes, or deodorant. Men may shave face and neck. Do not bring valuables to the hospital.             [facility] is not responsible for any belongings or valuables.  Do NOT Smoke (Tobacco/Vaping)  24 hours prior to your procedure If you use a CPAP at night, you may bring your mask for your overnight stay.   Contacts, glasses, dentures or bridgework may not be worn into surgery, please bring cases for these belongings   For patients admitted to the hospital, discharge time will be determined by your treatment team.   Patients discharged the day of surgery will not be allowed to drive home, and someone needs to stay with them for 24 hours.  NO VISITORS WILL BE ALLOWED IN PRE-OP WHERE PATIENTS GET READY FOR SURGERY.  ONLY 1 SUPPORT PERSON MAY BE PRESENT WHILE YOU ARE IN SURGERY.  IF YOU ARE TO BE ADMITTED, ONCE YOU ARE IN YOUR ROOM YOU WILL BE ALLOWED TWO (2) VISITORS.  Minor children may have two parents present. Special consideration for safety and communication needs will be reviewed on a case by case basis.  Special  instructions:    Oral Hygiene is also important to reduce your risk of infection.  Remember - BRUSH YOUR TEETH THE MORNING OF SURGERY WITH YOUR REGULAR TOOTHPASTE   [place]- Preparing For Surgery  Before surgery, you can play an important role. Because skin is not sterile, your skin needs to be as free of germs as possible. You can reduce the number of germs on your skin by washing with CHG (chlorahexidine gluconate) Soap before surgery.  CHG is an antiseptic cleaner which kills germs and bonds with the skin to continue killing germs even after washing.     Please do not use if you have an allergy to CHG or antibacterial soaps. If your skin becomes reddened/irritated stop using the CHG.  Do not shave (including legs and underarms) for at least 48 hours prior to first CHG shower. It is OK to shave your face.  Please follow these instructions carefully.     Shower the NIGHT BEFORE SURGERY and the MORNING OF SURGERY with CHG Soap.   If you chose to wash your hair, wash your hair first as usual with your normal shampoo. After you shampoo, rinse your hair and body thoroughly to remove the shampoo.  Then [employer] and genitals (private parts) with your normal soap and rinse thoroughly to remove soap.  After that Use CHG Soap as  you would any other liquid soap. You can apply CHG directly to the skin and wash gently with a scrungie or a clean washcloth.   Apply the CHG Soap to your body ONLY FROM THE NECK DOWN.  Do not use on open wounds or open sores. Avoid contact with your eyes, ears, mouth and genitals (private parts). Wash Face and genitals (private parts)  with your normal soap.   Wash thoroughly, paying special attention to the area where your surgery will be performed.  Thoroughly rinse your body with warm water from the neck down.  DO NOT shower/wash with your normal soap after using and rinsing off the CHG Soap.  Pat yourself dry with a CLEAN TOWEL.  Wear CLEAN PAJAMAS to bed the  night before surgery  Place CLEAN SHEETS on your bed the night before your surgery  DO NOT SLEEP WITH PETS.   Day of Surgery:  Take a shower with CHG soap. Wear Clean/Comfortable clothing the morning of surgery Do not apply any deodorants/lotions.   Remember to brush your teeth WITH YOUR REGULAR TOOTHPASTE.   Please read over the following fact sheets that you were given.

## 2021-03-30 MED ORDER — PHENYLEPHRINE HCL-NACL 20-0.9 MG/250ML-% IV SOLN
30.0000 ug/min | INTRAVENOUS | Status: AC
Start: 2021-04-02 — End: 2021-04-02
  Administered 2021-04-02: 25 ug/min via INTRAVENOUS
  Filled 2021-03-30: qty 250

## 2021-03-30 MED ORDER — POTASSIUM CHLORIDE 2 MEQ/ML IV SOLN
80.0000 meq | INTRAVENOUS | Status: DC
  Filled 2021-03-30: qty 40

## 2021-03-30 MED ORDER — HEPARIN 30,000 UNITS/1000 ML (OHS) CELLSAVER SOLUTION
Status: DC
  Filled 2021-03-30: qty 1000

## 2021-03-30 MED ORDER — INSULIN REGULAR(HUMAN) IN NACL 100-0.9 UT/100ML-% IV SOLN
INTRAVENOUS | Status: AC
  Administered 2021-04-02: 2.6 [IU]/h via INTRAVENOUS
  Filled 2021-03-30: qty 100

## 2021-03-30 MED ORDER — TRANEXAMIC ACID 1000 MG/10ML IV SOLN
1.5000 mg/kg/h | INTRAVENOUS | Status: AC
  Administered 2021-04-02: 1.5 mg/kg/h via INTRAVENOUS
  Filled 2021-03-30: qty 25

## 2021-03-30 MED ORDER — PLASMA-LYTE A IV SOLN
INTRAVENOUS | Status: DC
  Filled 2021-03-30: qty 5

## 2021-03-30 MED ORDER — DEXMEDETOMIDINE HCL IN NACL 400 MCG/100ML IV SOLN
0.1000 ug/kg/h | INTRAVENOUS | Status: AC
  Administered 2021-04-02: .5 ug/kg/h via INTRAVENOUS
  Filled 2021-03-30: qty 100

## 2021-03-30 MED ORDER — CEFAZOLIN SODIUM-DEXTROSE 2-4 GM/100ML-% IV SOLN
2.0000 g | INTRAVENOUS | Status: AC
  Administered 2021-04-02: 2 g via INTRAVENOUS
  Filled 2021-03-30: qty 100

## 2021-03-30 MED ORDER — TRANEXAMIC ACID (OHS) BOLUS VIA INFUSION
15.0000 mg/kg | INTRAVENOUS | Status: AC
  Administered 2021-04-02: 1414.5 mg via INTRAVENOUS
  Filled 2021-03-30: qty 1415

## 2021-03-30 MED ORDER — VANCOMYCIN HCL 1250 MG/250ML IV SOLN
1250.0000 mg | INTRAVENOUS | Status: DC
  Filled 2021-03-30: qty 250

## 2021-03-30 MED ORDER — TRANEXAMIC ACID (OHS) PUMP PRIME SOLUTION
2.0000 mg/kg | INTRAVENOUS | Status: DC
  Filled 2021-03-30: qty 1.89

## 2021-03-30 MED ORDER — EPINEPHRINE HCL 5 MG/250ML IV SOLN IN NS
0.0000 ug/min | INTRAVENOUS | Status: DC
  Filled 2021-03-30: qty 250

## 2021-03-30 MED ORDER — NOREPINEPHRINE 4 MG/250ML-% IV SOLN
0.0000 ug/min | INTRAVENOUS | Status: DC
  Filled 2021-03-30: qty 250

## 2021-03-30 MED ORDER — VANCOMYCIN HCL 1500 MG/300ML IV SOLN
1500.0000 mg | INTRAVENOUS | Status: AC
  Administered 2021-04-02: 1500 mg via INTRAVENOUS
  Filled 2021-03-30: qty 300

## 2021-03-30 MED ORDER — MAGNESIUM SULFATE 50 % IJ SOLN
40.0000 meq | INTRAMUSCULAR | Status: DC
Start: 2021-04-02 — End: 2021-03-31
  Filled 2021-03-30: qty 9.85

## 2021-03-30 MED ORDER — MILRINONE LACTATE IN DEXTROSE 20-5 MG/100ML-% IV SOLN
0.3000 ug/kg/min | INTRAVENOUS | Status: DC
  Filled 2021-03-30: qty 100

## 2021-03-30 MED ORDER — NITROGLYCERIN IN D5W 200-5 MCG/ML-% IV SOLN
2.0000 ug/min | INTRAVENOUS | Status: AC
  Administered 2021-04-02: 16.6 ug/min via INTRAVENOUS
  Filled 2021-03-30: qty 250

## 2021-03-30 MED ORDER — MANNITOL 20 % IV SOLN
INTRAVENOUS | Status: DC
  Filled 2021-03-30: qty 13

## 2021-04-02 ENCOUNTER — Inpatient Hospital Stay (HOSPITAL_COMMUNITY)
Admission: RE | Admit: 2021-04-02 | Discharge: 2021-04-07 | DRG: 220 | Disposition: A | Discharge status: Home / Self Care | Payer: Self-pay | Attending: Thoracic Surgery (Cardiothoracic Vascular Surgery) | Admitting: Thoracic Surgery (Cardiothoracic Vascular Surgery)

## 2021-04-02 ENCOUNTER — Encounter (HOSPITAL_COMMUNITY): Payer: Self-pay | Admitting: Thoracic Surgery (Cardiothoracic Vascular Surgery)

## 2021-04-02 ENCOUNTER — Inpatient Hospital Stay (HOSPITAL_COMMUNITY): Payer: Self-pay

## 2021-04-02 ENCOUNTER — Encounter (HOSPITAL_COMMUNITY)
Admission: RE | Disposition: A | Discharge status: Home / Self Care | Payer: Self-pay | Source: Home / Self Care | Attending: Thoracic Surgery (Cardiothoracic Vascular Surgery)

## 2021-04-02 ENCOUNTER — Inpatient Hospital Stay (HOSPITAL_COMMUNITY): Payer: Self-pay | Admitting: Anesthesiology

## 2021-04-02 ENCOUNTER — Inpatient Hospital Stay (HOSPITAL_COMMUNITY): Payer: Self-pay | Admitting: Physician Assistant

## 2021-04-02 ENCOUNTER — Other Ambulatory Visit: Payer: Self-pay

## 2021-04-02 DIAGNOSIS — Q230 Congenital stenosis of aortic valve: Secondary | ICD-10-CM

## 2021-04-02 DIAGNOSIS — Z79899 Other long term (current) drug therapy: Secondary | ICD-10-CM

## 2021-04-02 DIAGNOSIS — J90 Pleural effusion, not elsewhere classified: Secondary | ICD-10-CM

## 2021-04-02 DIAGNOSIS — J939 Pneumothorax, unspecified: Secondary | ICD-10-CM

## 2021-04-02 DIAGNOSIS — Z8249 Family history of ischemic heart disease and other diseases of the circulatory system: Secondary | ICD-10-CM

## 2021-04-02 DIAGNOSIS — E785 Hyperlipidemia, unspecified: Secondary | ICD-10-CM | POA: Diagnosis present

## 2021-04-02 DIAGNOSIS — E119 Type 2 diabetes mellitus without complications: Secondary | ICD-10-CM | POA: Diagnosis present

## 2021-04-02 DIAGNOSIS — Q231 Congenital insufficiency of aortic valve: Secondary | ICD-10-CM

## 2021-04-02 DIAGNOSIS — E669 Obesity, unspecified: Secondary | ICD-10-CM | POA: Diagnosis present

## 2021-04-02 DIAGNOSIS — K5900 Constipation, unspecified: Secondary | ICD-10-CM | POA: Diagnosis not present

## 2021-04-02 DIAGNOSIS — Z6837 Body mass index (BMI) 37.0-37.9, adult: Secondary | ICD-10-CM

## 2021-04-02 DIAGNOSIS — I350 Nonrheumatic aortic (valve) stenosis: Secondary | ICD-10-CM

## 2021-04-02 DIAGNOSIS — Z833 Family history of diabetes mellitus: Secondary | ICD-10-CM

## 2021-04-02 DIAGNOSIS — D72829 Elevated white blood cell count, unspecified: Secondary | ICD-10-CM | POA: Diagnosis present

## 2021-04-02 DIAGNOSIS — Z953 Presence of xenogenic heart valve: Secondary | ICD-10-CM

## 2021-04-02 DIAGNOSIS — Z87891 Personal history of nicotine dependence: Secondary | ICD-10-CM

## 2021-04-02 DIAGNOSIS — J9811 Atelectasis: Secondary | ICD-10-CM | POA: Diagnosis not present

## 2021-04-02 DIAGNOSIS — Z952 Presence of prosthetic heart valve: Secondary | ICD-10-CM

## 2021-04-02 DIAGNOSIS — E8770 Fluid overload, unspecified: Secondary | ICD-10-CM | POA: Diagnosis not present

## 2021-04-02 DIAGNOSIS — E876 Hypokalemia: Secondary | ICD-10-CM | POA: Diagnosis not present

## 2021-04-02 DIAGNOSIS — D62 Acute posthemorrhagic anemia: Secondary | ICD-10-CM | POA: Diagnosis present

## 2021-04-02 HISTORY — PX: AORTIC VALVE REPLACEMENT: SHX41

## 2021-04-02 HISTORY — PX: TEE WITHOUT CARDIOVERSION: SHX5443

## 2021-04-02 LAB — POCT I-STAT, CHEM 8
BUN: 10 mg/dL (ref 6–20)
BUN: 12 mg/dL (ref 6–20)
BUN: 11 mg/dL (ref 6–20)
BUN: 10 mg/dL (ref 6–20)
Calcium, Ion: 0.98 mmol/L — ABNORMAL LOW (ref 1.15–1.40)
Calcium, Ion: 1.26 mmol/L (ref 1.15–1.40)
Calcium, Ion: 1.07 mmol/L — ABNORMAL LOW (ref 1.15–1.40)
Calcium, Ion: 1.36 mmol/L (ref 1.15–1.40)
Chloride: 102 mmol/L (ref 98–111)
Chloride: 103 mmol/L (ref 98–111)
Chloride: 104 mmol/L (ref 98–111)
Chloride: 101 mmol/L (ref 98–111)
Creatinine, Ser: 0.5 mg/dL — ABNORMAL LOW (ref 0.61–1.24)
Creatinine, Ser: 0.6 mg/dL — ABNORMAL LOW (ref 0.61–1.24)
Creatinine, Ser: 0.6 mg/dL — ABNORMAL LOW (ref 0.61–1.24)
Creatinine, Ser: 0.6 mg/dL — ABNORMAL LOW (ref 0.61–1.24)
Glucose, Bld: 106 mg/dL — ABNORMAL HIGH (ref 70–99)
Glucose, Bld: 132 mg/dL — ABNORMAL HIGH (ref 70–99)
Glucose, Bld: 113 mg/dL — ABNORMAL HIGH (ref 70–99)
Glucose, Bld: 167 mg/dL — ABNORMAL HIGH (ref 70–99)
HCT: 27 % — ABNORMAL LOW (ref 39.0–52.0)
HCT: 35 % — ABNORMAL LOW (ref 39.0–52.0)
HCT: 30 % — ABNORMAL LOW (ref 39.0–52.0)
HCT: 27 % — ABNORMAL LOW (ref 39.0–52.0)
Hemoglobin: 11.9 g/dL — ABNORMAL LOW (ref 13.0–17.0)
Hemoglobin: 9.2 g/dL — ABNORMAL LOW (ref 13.0–17.0)
Hemoglobin: 10.2 g/dL — ABNORMAL LOW (ref 13.0–17.0)
Hemoglobin: 9.2 g/dL — ABNORMAL LOW (ref 13.0–17.0)
Potassium: 4.1 mmol/L (ref 3.5–5.1)
Potassium: 5.7 mmol/L — ABNORMAL HIGH (ref 3.5–5.1)
Potassium: 4.6 mmol/L (ref 3.5–5.1)
Potassium: 3.8 mmol/L (ref 3.5–5.1)
Sodium: 138 mmol/L (ref 135–145)
Sodium: 138 mmol/L (ref 135–145)
Sodium: 139 mmol/L (ref 135–145)
Sodium: 137 mmol/L (ref 135–145)
TCO2: 28 mmol/L (ref 22–32)
TCO2: 29 mmol/L (ref 22–32)
TCO2: 29 mmol/L (ref 22–32)
TCO2: 27 mmol/L (ref 22–32)

## 2021-04-02 LAB — POCT I-STAT 7, (LYTES, BLD GAS, ICA,H+H)
Acid-Base Excess: 2 mmol/L (ref 0.0–2.0)
Acid-Base Excess: 3 mmol/L — ABNORMAL HIGH (ref 0.0–2.0)
Acid-Base Excess: 6 mmol/L — ABNORMAL HIGH (ref 0.0–2.0)
Acid-Base Excess: 2 mmol/L (ref 0.0–2.0)
Acid-base deficit: 1 mmol/L (ref 0.0–2.0)
Acid-base deficit: 2 mmol/L (ref 0.0–2.0)
Acid-base deficit: 3 mmol/L — ABNORMAL HIGH (ref 0.0–2.0)
Bicarbonate: 29.9 mmol/L — ABNORMAL HIGH (ref 20.0–28.0)
Bicarbonate: 27.9 mmol/L (ref 20.0–28.0)
Bicarbonate: 29.2 mmol/L — ABNORMAL HIGH (ref 20.0–28.0)
Bicarbonate: 27.8 mmol/L (ref 20.0–28.0)
Bicarbonate: 25.4 mmol/L (ref 20.0–28.0)
Bicarbonate: 23.8 mmol/L (ref 20.0–28.0)
Bicarbonate: 22.6 mmol/L (ref 20.0–28.0)
Calcium, Ion: 1.23 mmol/L (ref 1.15–1.40)
Calcium, Ion: 0.99 mmol/L — ABNORMAL LOW (ref 1.15–1.40)
Calcium, Ion: 1.07 mmol/L — ABNORMAL LOW (ref 1.15–1.40)
Calcium, Ion: 1.32 mmol/L (ref 1.15–1.40)
Calcium, Ion: 1.3 mmol/L (ref 1.15–1.40)
Calcium, Ion: 1.15 mmol/L (ref 1.15–1.40)
Calcium, Ion: 1.11 mmol/L — ABNORMAL LOW (ref 1.15–1.40)
HCT: 40 % (ref 39.0–52.0)
HCT: 28 % — ABNORMAL LOW (ref 39.0–52.0)
HCT: 31 % — ABNORMAL LOW (ref 39.0–52.0)
HCT: 29 % — ABNORMAL LOW (ref 39.0–52.0)
HCT: 37 % — ABNORMAL LOW (ref 39.0–52.0)
HCT: 36 % — ABNORMAL LOW (ref 39.0–52.0)
HCT: 35 % — ABNORMAL LOW (ref 39.0–52.0)
Hemoglobin: 13.6 g/dL (ref 13.0–17.0)
Hemoglobin: 9.5 g/dL — ABNORMAL LOW (ref 13.0–17.0)
Hemoglobin: 10.5 g/dL — ABNORMAL LOW (ref 13.0–17.0)
Hemoglobin: 9.9 g/dL — ABNORMAL LOW (ref 13.0–17.0)
Hemoglobin: 12.6 g/dL — ABNORMAL LOW (ref 13.0–17.0)
Hemoglobin: 12.2 g/dL — ABNORMAL LOW (ref 13.0–17.0)
Hemoglobin: 11.9 g/dL — ABNORMAL LOW (ref 13.0–17.0)
O2 Saturation: 100 %
O2 Saturation: 100 %
O2 Saturation: 100 %
O2 Saturation: 100 %
O2 Saturation: 96 %
O2 Saturation: 99 %
O2 Saturation: 99 %
Patient temperature: 97.7
Patient temperature: 38
Patient temperature: 38
Potassium: 4.6 mmol/L (ref 3.5–5.1)
Potassium: 5.7 mmol/L — ABNORMAL HIGH (ref 3.5–5.1)
Potassium: 4.1 mmol/L (ref 3.5–5.1)
Potassium: 3.8 mmol/L (ref 3.5–5.1)
Potassium: 3.6 mmol/L (ref 3.5–5.1)
Potassium: 4.4 mmol/L (ref 3.5–5.1)
Potassium: 4.2 mmol/L (ref 3.5–5.1)
Sodium: 139 mmol/L (ref 135–145)
Sodium: 138 mmol/L (ref 135–145)
Sodium: 140 mmol/L (ref 135–145)
Sodium: 138 mmol/L (ref 135–145)
Sodium: 141 mmol/L (ref 135–145)
Sodium: 141 mmol/L (ref 135–145)
Sodium: 141 mmol/L (ref 135–145)
TCO2: 32 mmol/L (ref 22–32)
TCO2: 29 mmol/L (ref 22–32)
TCO2: 30 mmol/L (ref 22–32)
TCO2: 29 mmol/L (ref 22–32)
TCO2: 27 mmol/L (ref 22–32)
TCO2: 25 mmol/L (ref 22–32)
TCO2: 24 mmol/L (ref 22–32)
pCO2 arterial: 59 mmHg — ABNORMAL HIGH (ref 32.0–48.0)
pCO2 arterial: 44.6 mmHg (ref 32.0–48.0)
pCO2 arterial: 37.8 mmHg (ref 32.0–48.0)
pCO2 arterial: 45.7 mmHg (ref 32.0–48.0)
pCO2 arterial: 48.8 mmHg — ABNORMAL HIGH (ref 32.0–48.0)
pCO2 arterial: 47.6 mmHg (ref 32.0–48.0)
pCO2 arterial: 41.9 mmHg (ref 32.0–48.0)
pH, Arterial: 7.313 — ABNORMAL LOW (ref 7.350–7.450)
pH, Arterial: 7.405 (ref 7.350–7.450)
pH, Arterial: 7.496 — ABNORMAL HIGH (ref 7.350–7.450)
pH, Arterial: 7.391 (ref 7.350–7.450)
pH, Arterial: 7.322 — ABNORMAL LOW (ref 7.350–7.450)
pH, Arterial: 7.313 — ABNORMAL LOW (ref 7.350–7.450)
pH, Arterial: 7.345 — ABNORMAL LOW (ref 7.350–7.450)
pO2, Arterial: 425 mmHg — ABNORMAL HIGH (ref 83.0–108.0)
pO2, Arterial: 199 mmHg — ABNORMAL HIGH (ref 83.0–108.0)
pO2, Arterial: 368 mmHg — ABNORMAL HIGH (ref 83.0–108.0)
pO2, Arterial: 178 mmHg — ABNORMAL HIGH (ref 83.0–108.0)
pO2, Arterial: 87 mmHg (ref 83.0–108.0)
pO2, Arterial: 137 mmHg — ABNORMAL HIGH (ref 83.0–108.0)
pO2, Arterial: 134 mmHg — ABNORMAL HIGH (ref 83.0–108.0)

## 2021-04-02 LAB — CBC
HCT: 36 % — ABNORMAL LOW (ref 39.0–52.0)
HCT: 38.5 % — ABNORMAL LOW (ref 39.0–52.0)
Hemoglobin: 12.3 g/dL — ABNORMAL LOW (ref 13.0–17.0)
Hemoglobin: 13.5 g/dL (ref 13.0–17.0)
MCH: 31.6 pg (ref 26.0–34.0)
MCH: 32.2 pg (ref 26.0–34.0)
MCHC: 34.2 g/dL (ref 30.0–36.0)
MCHC: 35.1 g/dL (ref 30.0–36.0)
MCV: 92.5 fL (ref 80.0–100.0)
MCV: 91.9 fL (ref 80.0–100.0)
Platelets: 165 10*3/uL (ref 150–400)
Platelets: 209 10*3/uL (ref 150–400)
RBC: 3.89 MIL/uL — ABNORMAL LOW (ref 4.22–5.81)
RBC: 4.19 MIL/uL — ABNORMAL LOW (ref 4.22–5.81)
RDW: 13.2 % (ref 11.5–15.5)
RDW: 13.2 % (ref 11.5–15.5)
WBC: 20.8 10*3/uL — ABNORMAL HIGH (ref 4.0–10.5)
WBC: 21.8 10*3/uL — ABNORMAL HIGH (ref 4.0–10.5)
nRBC: 0 % (ref 0.0–0.2)
nRBC: 0 % (ref 0.0–0.2)

## 2021-04-02 LAB — POCT I-STAT EG7
Acid-Base Excess: 5 mmol/L — ABNORMAL HIGH (ref 0.0–2.0)
Bicarbonate: 31.4 mmol/L — ABNORMAL HIGH (ref 20.0–28.0)
Calcium, Ion: 1.02 mmol/L — ABNORMAL LOW (ref 1.15–1.40)
HCT: 30 % — ABNORMAL LOW (ref 39.0–52.0)
Hemoglobin: 10.2 g/dL — ABNORMAL LOW (ref 13.0–17.0)
O2 Saturation: 70 %
Potassium: 4.2 mmol/L (ref 3.5–5.1)
Sodium: 142 mmol/L (ref 135–145)
TCO2: 33 mmol/L — ABNORMAL HIGH (ref 22–32)
pCO2, Ven: 54.3 mmHg (ref 44.0–60.0)
pH, Ven: 7.371 (ref 7.250–7.430)
pO2, Ven: 38 mmHg (ref 32.0–45.0)

## 2021-04-02 LAB — BASIC METABOLIC PANEL
Anion gap: 6 (ref 5–15)
BUN: 9 mg/dL (ref 6–20)
CO2: 24 mmol/L (ref 22–32)
Calcium: 7.7 mg/dL — ABNORMAL LOW (ref 8.9–10.3)
Chloride: 107 mmol/L (ref 98–111)
Creatinine, Ser: 0.78 mg/dL (ref 0.61–1.24)
GFR, Estimated: >60 mL/min (ref >60)
Glucose, Bld: 121 mg/dL — ABNORMAL HIGH (ref 70–99)
Potassium: 4.2 mmol/L (ref 3.5–5.1)
Sodium: 137 mmol/L (ref 135–145)

## 2021-04-02 LAB — GLUCOSE, CAPILLARY
Glucose-Capillary: 133 mg/dL — ABNORMAL HIGH (ref 70–99)
Glucose-Capillary: 110 mg/dL — ABNORMAL HIGH (ref 70–99)
Glucose-Capillary: 107 mg/dL — ABNORMAL HIGH (ref 70–99)
Glucose-Capillary: 120 mg/dL — ABNORMAL HIGH (ref 70–99)
Glucose-Capillary: 114 mg/dL — ABNORMAL HIGH (ref 70–99)
Glucose-Capillary: 114 mg/dL — ABNORMAL HIGH (ref 70–99)
Glucose-Capillary: 126 mg/dL — ABNORMAL HIGH (ref 70–99)
Glucose-Capillary: 118 mg/dL — ABNORMAL HIGH (ref 70–99)
Glucose-Capillary: 137 mg/dL — ABNORMAL HIGH (ref 70–99)

## 2021-04-02 LAB — APTT: aPTT: 35 seconds (ref 24–36)

## 2021-04-02 LAB — ABO/RH: ABO/RH(D): A POS

## 2021-04-02 LAB — HEMOGLOBIN AND HEMATOCRIT, BLOOD
HCT: 29.9 % — ABNORMAL LOW (ref 39.0–52.0)
Hemoglobin: 10.1 g/dL — ABNORMAL LOW (ref 13.0–17.0)

## 2021-04-02 LAB — PROTIME-INR
INR: 1.7 — ABNORMAL HIGH (ref 0.8–1.2)
Prothrombin Time: 20.1 seconds — ABNORMAL HIGH (ref 11.4–15.2)

## 2021-04-02 LAB — MAGNESIUM: Magnesium: 2.6 mg/dL — ABNORMAL HIGH (ref 1.7–2.4)

## 2021-04-02 LAB — PLATELET COUNT: Platelets: 198 10*3/uL (ref 150–400)

## 2021-04-02 SURGERY — REPLACEMENT, AORTIC VALVE, OPEN
Anesthesia: General | Site: Chest

## 2021-04-02 MED ORDER — SODIUM CHLORIDE 0.9 % IV SOLN: INTRAVENOUS | Status: DC | PRN

## 2021-04-02 MED ORDER — PROTAMINE SULFATE 10 MG/ML IV SOLN
INTRAVENOUS | Status: AC
  Filled 2021-04-02: qty 25

## 2021-04-02 MED ORDER — 0.9 % SODIUM CHLORIDE (POUR BTL) OPTIME
TOPICAL | Status: DC | PRN
  Administered 2021-04-02: 5000 mL

## 2021-04-02 MED ORDER — ACETAMINOPHEN 160 MG/5ML PO SOLN: 650.0000 mg | Freq: Once | ORAL | Status: AC

## 2021-04-02 MED ORDER — PANTOPRAZOLE SODIUM 40 MG PO TBEC
40.0000 mg | DELAYED_RELEASE_TABLET | Freq: Every day | ORAL | Status: DC
  Administered 2021-04-04 – 2021-04-07 (×4): 40 mg via ORAL
  Filled 2021-04-02 (×4): qty 1

## 2021-04-02 MED ORDER — ROCURONIUM BROMIDE 10 MG/ML (PF) SYRINGE
PREFILLED_SYRINGE | INTRAVENOUS | Status: AC
  Filled 2021-04-02: qty 10

## 2021-04-02 MED ORDER — DEXMEDETOMIDINE HCL IN NACL 400 MCG/100ML IV SOLN: 0.0000 ug/kg/h | INTRAVENOUS | Status: DC

## 2021-04-02 MED ORDER — DOCUSATE SODIUM 100 MG PO CAPS
200.0000 mg | ORAL_CAPSULE | Freq: Every day | ORAL | Status: DC
  Administered 2021-04-03 – 2021-04-06 (×4): 200 mg via ORAL
  Filled 2021-04-02 (×4): qty 2

## 2021-04-02 MED ORDER — INSULIN REGULAR(HUMAN) IN NACL 100-0.9 UT/100ML-% IV SOLN: INTRAVENOUS | Status: DC

## 2021-04-02 MED ORDER — TRAMADOL HCL 50 MG PO TABS
50.0000 mg | ORAL_TABLET | ORAL | Status: DC | PRN
  Administered 2021-04-03 (×2): 50 mg via ORAL
  Administered 2021-04-04: 100 mg via ORAL
  Administered 2021-04-04: 50 mg via ORAL
  Administered 2021-04-04: 100 mg via ORAL
  Administered 2021-04-05: 50 mg via ORAL
  Administered 2021-04-06: 100 mg via ORAL
  Administered 2021-04-06 (×2): 50 mg via ORAL
  Administered 2021-04-07 (×2): 100 mg via ORAL
  Filled 2021-04-02 (×2): qty 2
  Filled 2021-04-02 (×4): qty 1
  Filled 2021-04-02: qty 2
  Filled 2021-04-02: qty 1
  Filled 2021-04-02: qty 2
  Filled 2021-04-02: qty 1
  Filled 2021-04-02: qty 2

## 2021-04-02 MED ORDER — HEPARIN SODIUM (PORCINE) 1000 UNIT/ML IJ SOLN
INTRAMUSCULAR | Status: DC | PRN
  Administered 2021-04-02: 33000 [IU] via INTRAVENOUS

## 2021-04-02 MED ORDER — ORAL CARE MOUTH RINSE: 15.0000 mL | Freq: Two times a day (BID) | OROMUCOSAL | Status: DC

## 2021-04-02 MED ORDER — CHLORHEXIDINE GLUCONATE CLOTH 2 % EX PADS
6.0000 | MEDICATED_PAD | Freq: Every day | CUTANEOUS | Status: DC
  Administered 2021-04-02 – 2021-04-05 (×3): 6 via TOPICAL

## 2021-04-02 MED ORDER — SUCCINYLCHOLINE CHLORIDE 200 MG/10ML IV SOSY
PREFILLED_SYRINGE | INTRAVENOUS | Status: AC
  Filled 2021-04-02: qty 10

## 2021-04-02 MED ORDER — ROCURONIUM BROMIDE 10 MG/ML (PF) SYRINGE
PREFILLED_SYRINGE | INTRAVENOUS | Status: DC | PRN
  Administered 2021-04-02 (×2): 50 mg via INTRAVENOUS
  Administered 2021-04-02: 70 mg via INTRAVENOUS
  Administered 2021-04-02: 30 mg via INTRAVENOUS

## 2021-04-02 MED ORDER — ACETAMINOPHEN 500 MG PO TABS: 1000.0000 mg | ORAL_TABLET | Freq: Once | ORAL | Status: DC

## 2021-04-02 MED ORDER — PHENYLEPHRINE HCL-NACL 20-0.9 MG/250ML-% IV SOLN
0.0000 ug/min | INTRAVENOUS | Status: DC
  Administered 2021-04-02: 40 ug/min via INTRAVENOUS
  Administered 2021-04-02: 20 ug/min via INTRAVENOUS
  Filled 2021-04-02: qty 250

## 2021-04-02 MED ORDER — MIDAZOLAM HCL 2 MG/2ML IJ SOLN: 2.0000 mg | INTRAMUSCULAR | Status: DC | PRN

## 2021-04-02 MED ORDER — VANCOMYCIN HCL IN DEXTROSE 1-5 GM/200ML-% IV SOLN
1000.0000 mg | Freq: Once | INTRAVENOUS | Status: AC
  Administered 2021-04-02: 1000 mg via INTRAVENOUS
  Filled 2021-04-02: qty 200

## 2021-04-02 MED ORDER — METOPROLOL TARTRATE 12.5 MG HALF TABLET: 12.5000 mg | ORAL_TABLET | Freq: Two times a day (BID) | ORAL | Status: DC

## 2021-04-02 MED ORDER — CHLORHEXIDINE GLUCONATE 0.12 % MT SOLN
15.0000 mL | Freq: Two times a day (BID) | OROMUCOSAL | Status: DC
  Administered 2021-04-02 – 2021-04-03 (×2): 15 mL via OROMUCOSAL
  Filled 2021-04-02 (×2): qty 15

## 2021-04-02 MED ORDER — NITROGLYCERIN IN D5W 200-5 MCG/ML-% IV SOLN
0.0000 ug/min | INTRAVENOUS | Status: DC
Start: 2021-04-02 — End: 2021-04-03

## 2021-04-02 MED ORDER — PHENYLEPHRINE 40 MCG/ML (10ML) SYRINGE FOR IV PUSH (FOR BLOOD PRESSURE SUPPORT)
PREFILLED_SYRINGE | INTRAVENOUS | Status: DC | PRN
  Administered 2021-04-02: 80 ug via INTRAVENOUS
  Administered 2021-04-02: 40 ug via INTRAVENOUS
  Administered 2021-04-02: 80 ug via INTRAVENOUS
  Administered 2021-04-02: 40 ug via INTRAVENOUS

## 2021-04-02 MED ORDER — PLASMA-LYTE A IV SOLN
INTRAVENOUS | Status: DC | PRN
  Administered 2021-04-02: 500 mL via INTRAVASCULAR

## 2021-04-02 MED ORDER — SODIUM CHLORIDE 0.9% FLUSH
10.0000 mL | Freq: Two times a day (BID) | INTRAVENOUS | Status: DC
Start: 2021-04-02 — End: 2021-04-05
  Administered 2021-04-02 – 2021-04-04 (×5): 10 mL

## 2021-04-02 MED ORDER — LACTATED RINGERS IV SOLN: INTRAVENOUS | Status: DC

## 2021-04-02 MED ORDER — ARTIFICIAL TEARS OPHTHALMIC OINT
TOPICAL_OINTMENT | OPHTHALMIC | Status: AC
  Filled 2021-04-02: qty 3.5

## 2021-04-02 MED ORDER — ASPIRIN EC 325 MG PO TBEC
325.0000 mg | DELAYED_RELEASE_TABLET | Freq: Every day | ORAL | Status: DC
  Administered 2021-04-03 – 2021-04-04 (×2): 325 mg via ORAL
  Filled 2021-04-02 (×2): qty 1

## 2021-04-02 MED ORDER — SODIUM CHLORIDE 0.9% FLUSH: 3.0000 mL | INTRAVENOUS | Status: DC | PRN

## 2021-04-02 MED ORDER — LACTATED RINGERS IV SOLN: INTRAVENOUS | Status: DC | PRN

## 2021-04-02 MED ORDER — BISACODYL 5 MG PO TBEC
10.0000 mg | DELAYED_RELEASE_TABLET | Freq: Every day | ORAL | Status: DC
  Administered 2021-04-03 – 2021-04-06 (×4): 10 mg via ORAL
  Filled 2021-04-02 (×4): qty 2

## 2021-04-02 MED ORDER — ACETAMINOPHEN 650 MG RE SUPP
650.0000 mg | Freq: Once | RECTAL | Status: AC
  Administered 2021-04-02: 650 mg via RECTAL

## 2021-04-02 MED ORDER — PROTAMINE SULFATE 10 MG/ML IV SOLN
INTRAVENOUS | Status: DC | PRN
  Administered 2021-04-02: 310 mg via INTRAVENOUS
  Administered 2021-04-02: 20 mg via INTRAVENOUS

## 2021-04-02 MED ORDER — CHLORHEXIDINE GLUCONATE 0.12 % MT SOLN: 15.0000 mL | OROMUCOSAL | Status: AC

## 2021-04-02 MED ORDER — ACETAMINOPHEN 160 MG/5ML PO SOLN
1000.0000 mg | Freq: Four times a day (QID) | ORAL | Status: DC
Start: 2021-04-03 — End: 2021-04-07

## 2021-04-02 MED ORDER — HEPARIN SODIUM (PORCINE) 1000 UNIT/ML IJ SOLN
INTRAMUSCULAR | Status: AC
  Filled 2021-04-02: qty 1

## 2021-04-02 MED ORDER — CHLORHEXIDINE GLUCONATE 0.12 % MT SOLN
15.0000 mL | Freq: Once | OROMUCOSAL | Status: AC
  Administered 2021-04-02: 15 mL via OROMUCOSAL
  Filled 2021-04-02: qty 15

## 2021-04-02 MED ORDER — MORPHINE SULFATE (PF) 2 MG/ML IV SOLN
1.0000 mg | INTRAVENOUS | Status: DC | PRN
  Administered 2021-04-02: 2 mg via INTRAVENOUS
  Administered 2021-04-03: 4 mg via INTRAVENOUS
  Administered 2021-04-03 – 2021-04-05 (×2): 2 mg via INTRAVENOUS
  Filled 2021-04-02 (×2): qty 1
  Filled 2021-04-02: qty 2
  Filled 2021-04-02: qty 1

## 2021-04-02 MED ORDER — FAMOTIDINE 20 MG IN NS 100 ML IVPB
20.0000 mg | Freq: Two times a day (BID) | INTRAVENOUS | Status: DC
  Administered 2021-04-02: 20 mg via INTRAVENOUS
  Filled 2021-04-02: qty 100

## 2021-04-02 MED ORDER — OXYCODONE HCL 5 MG PO TABS
5.0000 mg | ORAL_TABLET | ORAL | Status: DC | PRN
  Administered 2021-04-03 – 2021-04-04 (×6): 10 mg via ORAL
  Administered 2021-04-05: 5 mg via ORAL
  Administered 2021-04-05: 10 mg via ORAL
  Administered 2021-04-05 (×2): 5 mg via ORAL
  Administered 2021-04-06: 10 mg via ORAL
  Filled 2021-04-02: qty 1
  Filled 2021-04-02 (×2): qty 2
  Filled 2021-04-02: qty 1
  Filled 2021-04-02 (×2): qty 2
  Filled 2021-04-02 (×2): qty 1
  Filled 2021-04-02 (×2): qty 2
  Filled 2021-04-02: qty 1
  Filled 2021-04-02: qty 2

## 2021-04-02 MED ORDER — LIDOCAINE 2% (20 MG/ML) 5 ML SYRINGE
INTRAMUSCULAR | Status: DC | PRN
  Administered 2021-04-02: 80 mg via INTRAVENOUS

## 2021-04-02 MED ORDER — ORAL CARE MOUTH RINSE
15.0000 mL | OROMUCOSAL | Status: DC
  Administered 2021-04-02: 15 mL via OROMUCOSAL

## 2021-04-02 MED ORDER — LACTATED RINGERS IV SOLN
500.0000 mL | Freq: Once | INTRAVENOUS | Status: DC | PRN
Start: 2021-04-02 — End: 2021-04-04

## 2021-04-02 MED ORDER — METOPROLOL TARTRATE 25 MG/10 ML ORAL SUSPENSION: 12.5000 mg | Freq: Two times a day (BID) | ORAL | Status: DC

## 2021-04-02 MED ORDER — ACETAMINOPHEN 500 MG PO TABS
1000.0000 mg | ORAL_TABLET | Freq: Four times a day (QID) | ORAL | Status: DC
  Administered 2021-04-02 – 2021-04-06 (×16): 1000 mg via ORAL
  Filled 2021-04-02 (×16): qty 2

## 2021-04-02 MED ORDER — CHLORHEXIDINE GLUCONATE 4 % EX LIQD: 30.0000 mL | CUTANEOUS | Status: DC

## 2021-04-02 MED ORDER — MIDAZOLAM HCL (PF) 5 MG/ML IJ SOLN
INTRAMUSCULAR | Status: DC | PRN
  Administered 2021-04-02: 2 mg via INTRAVENOUS
  Administered 2021-04-02 (×2): 3 mg via INTRAVENOUS
  Administered 2021-04-02: 2 mg via INTRAVENOUS

## 2021-04-02 MED ORDER — SODIUM CHLORIDE 0.9 % IV SOLN: INTRAVENOUS | Status: DC

## 2021-04-02 MED ORDER — DEXTROSE 50 % IV SOLN: 0.0000 mL | INTRAVENOUS | Status: DC | PRN

## 2021-04-02 MED ORDER — METOPROLOL TARTRATE 5 MG/5ML IV SOLN: 2.5000 mg | INTRAVENOUS | Status: DC | PRN

## 2021-04-02 MED ORDER — DOBUTAMINE IN D5W 4-5 MG/ML-% IV SOLN: 2.5000 ug/kg/min | INTRAVENOUS | Status: DC

## 2021-04-02 MED ORDER — SODIUM CHLORIDE 0.9% FLUSH: 10.0000 mL | INTRAVENOUS | Status: DC | PRN

## 2021-04-02 MED ORDER — METOPROLOL TARTRATE 12.5 MG HALF TABLET
12.5000 mg | ORAL_TABLET | Freq: Once | ORAL | Status: AC
  Administered 2021-04-02: 12.5 mg via ORAL
  Filled 2021-04-02: qty 1

## 2021-04-02 MED ORDER — SODIUM CHLORIDE 0.9 % IV SOLN: 250.0000 mL | INTRAVENOUS | Status: DC

## 2021-04-02 MED ORDER — ONDANSETRON HCL 4 MG/2ML IJ SOLN
4.0000 mg | Freq: Four times a day (QID) | INTRAMUSCULAR | Status: DC | PRN
  Administered 2021-04-03: 4 mg via INTRAVENOUS
  Filled 2021-04-02: qty 2

## 2021-04-02 MED ORDER — SODIUM CHLORIDE 0.45 % IV SOLN: INTRAVENOUS | Status: DC | PRN

## 2021-04-02 MED ORDER — MIDAZOLAM HCL (PF) 10 MG/2ML IJ SOLN
INTRAMUSCULAR | Status: AC
  Filled 2021-04-02: qty 2

## 2021-04-02 MED ORDER — DOBUTAMINE INFUSION FOR EP/ECHO/NUC (1000 MCG/ML)
0.0000 ug/kg/min | INTRAVENOUS | Status: DC
  Filled 2021-04-02: qty 250

## 2021-04-02 MED ORDER — PROPOFOL 10 MG/ML IV BOLUS
INTRAVENOUS | Status: AC
  Filled 2021-04-02: qty 20

## 2021-04-02 MED ORDER — BISACODYL 10 MG RE SUPP: 10.0000 mg | Freq: Every day | RECTAL | Status: DC

## 2021-04-02 MED ORDER — FENTANYL CITRATE (PF) 250 MCG/5ML IJ SOLN
INTRAMUSCULAR | Status: AC
  Filled 2021-04-02: qty 25

## 2021-04-02 MED ORDER — CHLORHEXIDINE GLUCONATE 0.12% ORAL RINSE (MEDLINE KIT)
15.0000 mL | Freq: Two times a day (BID) | OROMUCOSAL | Status: DC
  Administered 2021-04-02: 15 mL via OROMUCOSAL

## 2021-04-02 MED ORDER — CEFAZOLIN SODIUM-DEXTROSE 2-4 GM/100ML-% IV SOLN
2.0000 g | Freq: Three times a day (TID) | INTRAVENOUS | Status: AC
  Administered 2021-04-02 – 2021-04-04 (×6): 2 g via INTRAVENOUS
  Filled 2021-04-02 (×6): qty 100

## 2021-04-02 MED ORDER — MAGNESIUM SULFATE 4 GM/100ML IV SOLN
4.0000 g | Freq: Once | INTRAVENOUS | Status: AC
  Administered 2021-04-02: 4 g via INTRAVENOUS
  Filled 2021-04-02: qty 100

## 2021-04-02 MED ORDER — ALBUMIN HUMAN 5 % IV SOLN
250.0000 mL | INTRAVENOUS | Status: AC | PRN
  Administered 2021-04-02: 12.5 g via INTRAVENOUS

## 2021-04-02 MED ORDER — PHENYLEPHRINE 40 MCG/ML (10ML) SYRINGE FOR IV PUSH (FOR BLOOD PRESSURE SUPPORT)
PREFILLED_SYRINGE | INTRAVENOUS | Status: AC
  Filled 2021-04-02: qty 10

## 2021-04-02 MED ORDER — FENTANYL CITRATE (PF) 250 MCG/5ML IJ SOLN
INTRAMUSCULAR | Status: DC | PRN
  Administered 2021-04-02 (×2): 150 ug via INTRAVENOUS
  Administered 2021-04-02: 50 ug via INTRAVENOUS
  Administered 2021-04-02: 100 ug via INTRAVENOUS
  Administered 2021-04-02: 250 ug via INTRAVENOUS
  Administered 2021-04-02: 100 ug via INTRAVENOUS
  Administered 2021-04-02: 200 ug via INTRAVENOUS
  Administered 2021-04-02: 150 ug via INTRAVENOUS
  Administered 2021-04-02: 100 ug via INTRAVENOUS

## 2021-04-02 MED ORDER — ASPIRIN 81 MG PO CHEW: 324.0000 mg | CHEWABLE_TABLET | Freq: Every day | ORAL | Status: DC

## 2021-04-02 MED ORDER — SODIUM CHLORIDE 0.9% FLUSH
3.0000 mL | Freq: Two times a day (BID) | INTRAVENOUS | Status: DC
  Administered 2021-04-03 – 2021-04-04 (×3): 3 mL via INTRAVENOUS

## 2021-04-02 MED ORDER — SUCCINYLCHOLINE CHLORIDE 200 MG/10ML IV SOSY
PREFILLED_SYRINGE | INTRAVENOUS | Status: DC | PRN
  Administered 2021-04-02: 140 mg via INTRAVENOUS

## 2021-04-02 MED ORDER — PROPOFOL 10 MG/ML IV BOLUS
INTRAVENOUS | Status: DC | PRN
  Administered 2021-04-02 (×2): 20 mg via INTRAVENOUS
  Administered 2021-04-02: 50 mg via INTRAVENOUS

## 2021-04-02 MED ORDER — POTASSIUM CHLORIDE 10 MEQ/50ML IV SOLN
10.0000 meq | INTRAVENOUS | Status: AC
  Administered 2021-04-02 (×3): 10 meq via INTRAVENOUS

## 2021-04-02 MED ORDER — ATORVASTATIN CALCIUM 40 MG PO TABS
40.0000 mg | ORAL_TABLET | Freq: Every day | ORAL | Status: DC
  Administered 2021-04-03 – 2021-04-06 (×4): 40 mg via ORAL
  Filled 2021-04-02 (×4): qty 1

## 2021-04-02 MED ORDER — SODIUM CHLORIDE (PF) 0.9 % IJ SOLN
OROMUCOSAL | Status: DC | PRN
  Administered 2021-04-02 (×3): 4 mL via TOPICAL

## 2021-04-02 MED ORDER — ~~LOC~~ CARDIAC SURGERY, PATIENT & FAMILY EDUCATION
Freq: Once | Status: DC
  Filled 2021-04-02: qty 1

## 2021-04-02 MED ORDER — NICARDIPINE HCL IN NACL 20-0.86 MG/200ML-% IV SOLN
0.0000 mg/h | INTRAVENOUS | Status: DC
Start: 2021-04-02 — End: 2021-04-03

## 2021-04-02 SURGICAL SUPPLY — 76 items
ADAPTER CARDIO PERF ANTE/RETRO (ADAPTER) ×4 IMPLANT
ADPR PRFSN 84XANTGRD RTRGD (ADAPTER) ×3
BAG DECANTER FOR FLEXI CONT (MISCELLANEOUS) ×4 IMPLANT
BLADE CLIPPER SURG (BLADE) ×4 IMPLANT
BLADE STERNUM SYSTEM 6 (BLADE) ×4 IMPLANT
BLADE SURG 15 STRL LF DISP TIS (BLADE) ×3 IMPLANT
BLADE SURG 15 STRL SS (BLADE) ×4
CANISTER SUCT 3000ML PPV (MISCELLANEOUS) ×4 IMPLANT
CANNULA GUNDRY RCSP 15FR (MISCELLANEOUS) ×3 IMPLANT
CANNULA NON VENT 20FR 12 (CANNULA) ×4 IMPLANT
CATH CPB KIT HENDRICKSON (MISCELLANEOUS) ×4 IMPLANT
CATH HEART VENT LEFT (CATHETERS) ×3 IMPLANT
CATH ROBINSON RED A/P 18FR (CATHETERS) ×11 IMPLANT
CNTNR URN SCR LID CUP LEK RST (MISCELLANEOUS) ×3 IMPLANT
CONN ST 1/2X1/2  BEN (MISCELLANEOUS) ×4
CONN ST 1/2X1/2 BEN (MISCELLANEOUS) ×3 IMPLANT
CONN ST 1/4X3/8  BEN (MISCELLANEOUS)
CONN ST 1/4X3/8 BEN (MISCELLANEOUS) ×6 IMPLANT
CONNECTOR BLAKE 2:1 CARIO BLK (MISCELLANEOUS) ×1 IMPLANT
CONT SPEC 4OZ STRL OR WHT (MISCELLANEOUS) ×4
CONTAINER PROTECT SURGISLUSH (MISCELLANEOUS) ×5 IMPLANT
COVER SURGICAL LIGHT HANDLE (MISCELLANEOUS) ×3 IMPLANT
DEVICE SUT CK QUICK LOAD MINI (Prosthesis & Implant Heart) ×1 IMPLANT
DRAIN CHANNEL 28F RND 3/8 FF (WOUND CARE) ×8 IMPLANT
DRAPE CARDIOVASCULAR INCISE (DRAPES) ×4
DRAPE SRG 135X102X78XABS (DRAPES) ×3 IMPLANT
DRAPE WARM FLUID 44X44 (DRAPES) ×4 IMPLANT
DRESSING AQUACEL AG SP 3.5X10 (GAUZE/BANDAGES/DRESSINGS) IMPLANT
DRSG AQUACEL AG SP 3.5X10 (GAUZE/BANDAGES/DRESSINGS) ×4
DRSG COVADERM 4X14 (GAUZE/BANDAGES/DRESSINGS) ×3 IMPLANT
ELECT CAUTERY BLADE 6.4 (BLADE) ×4 IMPLANT
ELECT REM PT RETURN 9FT ADLT (ELECTROSURGICAL) ×8
ELECTRODE REM PT RTRN 9FT ADLT (ELECTROSURGICAL) ×6 IMPLANT
FELT TEFLON 1X6 (MISCELLANEOUS) ×7 IMPLANT
GAUZE SPONGE 4X4 12PLY STRL (GAUZE/BANDAGES/DRESSINGS) ×4 IMPLANT
GLOVE EUDERMIC 7 POWDERFREE (GLOVE) ×8 IMPLANT
GLOVE SURG ENC MOIS LTX SZ6 (GLOVE) IMPLANT
GLOVE SURG ENC MOIS LTX SZ6.5 (GLOVE) IMPLANT
GLOVE SURG ENC MOIS LTX SZ7 (GLOVE) ×12 IMPLANT
GLOVE SURG ENC MOIS LTX SZ7.5 (GLOVE) IMPLANT
GOWN STRL REUS W/ TWL LRG LVL3 (GOWN DISPOSABLE) ×12 IMPLANT
GOWN STRL REUS W/ TWL XL LVL3 (GOWN DISPOSABLE) ×6 IMPLANT
GOWN STRL REUS W/TWL LRG LVL3 (GOWN DISPOSABLE) ×16
GOWN STRL REUS W/TWL XL LVL3 (GOWN DISPOSABLE) ×8
HEMOSTAT POWDER SURGIFOAM 1G (HEMOSTASIS) ×12 IMPLANT
HEMOSTAT SURGICEL 2X14 (HEMOSTASIS) ×3 IMPLANT
INSERT SUTURE HOLDER (MISCELLANEOUS) ×4 IMPLANT
KIT BASIN OR (CUSTOM PROCEDURE TRAY) ×4 IMPLANT
KIT SUCTION CATH 14FR (SUCTIONS) ×4 IMPLANT
KIT SUT CK MINI COMBO 4X17 (Prosthesis & Implant Heart) ×1 IMPLANT
KIT TURNOVER KIT B (KITS) ×4 IMPLANT
LEAD PACING MYOCARDI (MISCELLANEOUS) ×1 IMPLANT
NS IRRIG 1000ML POUR BTL (IV SOLUTION) ×23 IMPLANT
PACK E OPEN HEART (SUTURE) ×4 IMPLANT
PACK OPEN HEART (CUSTOM PROCEDURE TRAY) ×4 IMPLANT
PAD ARMBOARD 7.5X6 YLW CONV (MISCELLANEOUS) ×8 IMPLANT
POSITIONER HEAD DONUT 9IN (MISCELLANEOUS) ×4 IMPLANT
SET MPS 3-ND DEL (MISCELLANEOUS) ×1 IMPLANT
SUT BONE WAX W31G (SUTURE) ×4 IMPLANT
SUT EB EXC GRN/WHT 2-0 V-5 (SUTURE) ×8 IMPLANT
SUT PROLENE 3 0 SH DA (SUTURE) IMPLANT
SUT PROLENE 3 0 SH1 36 (SUTURE) ×3 IMPLANT
SUT PROLENE 4 0 RB 1 (SUTURE) ×12
SUT PROLENE 4 0 SH DA (SUTURE) ×1 IMPLANT
SUT PROLENE 4-0 RB1 .5 CRCL 36 (SUTURE) ×9 IMPLANT
SUT STEEL 6MS V (SUTURE) IMPLANT
SYSTEM SAHARA CHEST DRAIN ATS (WOUND CARE) ×4 IMPLANT
TAPE CLOTH SURG 4X10 WHT LF (GAUZE/BANDAGES/DRESSINGS) ×1 IMPLANT
TAPE PAPER 2X10 WHT MICROPORE (GAUZE/BANDAGES/DRESSINGS) ×1 IMPLANT
TOWEL GREEN STERILE (TOWEL DISPOSABLE) ×4 IMPLANT
TOWEL GREEN STERILE FF (TOWEL DISPOSABLE) ×4 IMPLANT
TRAY FOLEY SLVR 16FR TEMP STAT (SET/KITS/TRAYS/PACK) ×4 IMPLANT
UNDERPAD 30X36 HEAVY ABSORB (UNDERPADS AND DIAPERS) ×4 IMPLANT
VALVE ON-X AORTIC 23MM (Prosthesis & Implant Heart) ×1 IMPLANT
VENT LEFT HEART 12002 (CATHETERS)
WATER STERILE IRR 1000ML POUR (IV SOLUTION) ×8 IMPLANT

## 2021-04-02 NOTE — Discharge Summary (Signed)
Physician Discharge Summary       [address]       [place] [address]             [phone]    Patient ID: Kevin Barron MRN: [id number] DOB/AGE: Nov 08, 1965 55 y.o.  Admit date: 04/02/2021 Discharge date: 04/08/2021  Admission Diagnoses: Severe aortic stenosis Discharge Diagnoses:  1.  S/P aortic valve replacement with bioprosthetic valve 2. 3. History of hyperlipidemia 4. History of obesity 5. History of diabetes mellitus Type 2 ([place])  Consults: None  Procedure (s):   04/02/2021 Patient:  Kevin Barron Pre-Op Dx: Severe aortic stenosis                         Bicuspid aortic valve Post-op Dx: Same Procedure: Aortic valve replacement with a 72mm On-x valve       Surgeon and Role:      * Lightfoot, Lucile Crater, MD - Primary   Assistant: D.Zimmerman, PA-C   HPI: This is a 55 year old male who was referred after TAVR evaluation for bicuspid aortic valve, and severe aortic stenosis.  He has experienced exertional dyspnea, and some chest pain over the last several months.  Recently, he did have some presyncopal episodes.  He is undergone a left heart catheter did not show any significant coronary disease. CTA of the chest showed nonaneurysmal thoracic aorta, without significant calcified atherosclerotic disease and severe aortic valve calcifications. Pre operative carotid duplex US showed no significant internal carotid artery stenosis bilaterally. He was also see by Dr. Benson Norway and there was no dental intervention needed. Dr. Kipp Brood discussed the need for aortic valve replacement via an interpreter. Potential risks, benefits, and complications of the surgery were discussed with the patient and he agreed to proceed with surgery.  Hospital Course: Patient underwent an aortic valve replacement on 04/02/2021. Patient was transferred from the OR to [facility] ICU in stable condition.  His vital signs and hemodynamics remained stable after transfer.   He was weaned from the mechanical ventilator and extubated by 5:30 PM on the day of surgery. The following day, drain lines were removed.  Chest tubes were left in place for drainage.  Anticoagulation was with Coumadin was initiated on postop day 1 for his mechanical prosthetic aortic valve.  Daily INR was monitored in dosing adjustments made accordingly.  He did not develop post operative heart block or arrhythmia and his pacing wires were removed without difficulty. Chest tube output remained low and these were also removed.  He was maintaining NSR and transferred to progressive care unit on 04/04/2021.  The patient continued to make good progress.  He remains on coumadin at 1 mg daily at time of discharge.  His most recent INR is 2.8 with a goal of 2.0-2.5 x 3 months, then this can decrease to 1.8 after that time.  The patient is ambulating without difficulty.  His surgical incisions are healing without evidence of infection.  He is medically stable for discharge home today.    Latest Vital Signs: Blood pressure (!) 141/78, pulse 89, temperature 98.6 F (37 C), temperature source Oral, resp. rate 20, height 5\' 3"  (1.6 m), weight 93.3 kg, SpO2 91 %.  Physical Exam: General appearance: alert, cooperative, and no distress Heart: regular rate and rhythm and no murmur Lungs: clear Abdomen: benign Extremities: no edema Wound: incis healing well   Discharge Condition:Stable and discharged to home.  Recent laboratory studies:  Lab Results  Component  Value Date   WBC 10.2 04/06/2021   HGB 9.3 (L) 04/06/2021   HCT 27.0 (L) 04/06/2021   MCV 92.5 04/06/2021   PLT 262 04/06/2021   Lab Results  Component Value Date   NA 136 04/06/2021   K 4.0 04/06/2021   CL 99 04/06/2021   CO2 31 04/06/2021   CREATININE 0.90 04/06/2021   GLUCOSE 111 (H) 04/06/2021      Diagnostic Studies: DG Chest 2 View  Result Date: 04/06/2021 CLINICAL DATA:  Pleural effusion EXAM: CHEST - 2 VIEW COMPARISON:   04/04/2021 FINDINGS: Prior median sternotomy. Heart is borderline in size. Linear areas of atelectasis in the left mid and lower lung and right lower lung, unchanged. No pneumothorax or effusion. IMPRESSION: Stable areas of atelectasis bilaterally.  No pneumothorax. Electronically Signed   By: Rolm Baptise M.D.   On: 04/06/2021 07:17   DG Chest 2 View  Result Date: 03/29/2021 CLINICAL DATA:  Preoperative evaluation for open heart surgery, aortic stenosis, diabetes mellitus, former smoker EXAM: CHEST - 2 VIEW COMPARISON:  03/27/2019 FINDINGS: Borderline enlargement of cardiac silhouette. Mediastinal contours and pulmonary vascularity normal. Lungs clear. No pulmonary infiltrate, pleural effusion, or pneumothorax. Osseous structures unremarkable. IMPRESSION: No acute abnormalities. Electronically Signed   By: Lavonia Dana M.D.   On: 03/29/2021 18:06   DG Chest Port 1 View  Result Date: 04/04/2021 CLINICAL DATA:  Status post cardiac surgery, follow-up film EXAM: PORTABLE CHEST 1 VIEW COMPARISON:  Previous day FINDINGS: There remains a right-sided approach central venous catheter with tip in the SVC. There appears to be 2 mediastinal/chest tube in similar position. Stable appearance of the cardiomediastinal silhouette. No large pleural effusion. No pneumothorax appreciated. Similar bilateral subsegmental discoid atelectasis. Median sternotomy wires. IMPRESSION: Support lines and tubes as described. Stable appearance of the chest with similar bilateral subsegmental atelectasis. No new focal process. Electronically Signed   By: Albin Felling M.D.   On: 04/04/2021 08:13   DG Chest Port 1 View  Result Date: 04/03/2021 CLINICAL DATA:  Status post cardiac surgery. EXAM: PORTABLE CHEST 1 VIEW COMPARISON:  April 02, 2021. FINDINGS: Stable cardiomegaly. Endotracheal and nasogastric tubes have been removed. Right internal jugular catheter is unchanged. Stable bilateral subsegmental atelectasis is noted. Bony  thorax is unremarkable. IMPRESSION: Endotracheal and nasogastric tubes have been removed. Stable bibasilar subsegmental atelectasis. Electronically Signed   By: Marijo Conception M.D.   On: 04/03/2021 08:32   DG Chest Port 1 View  Result Date: 04/02/2021 CLINICAL DATA:  Evaluate for pneumothorax. EXAM: PORTABLE CHEST 1 VIEW COMPARISON:  Chest x-ray dated March 29, 2021 FINDINGS: Cardiac and mediastinal contours are within normal limits for AP technique status post median sternotomy. ET tube is approximately 1.0 cm from the carina. Enteric tube tip and side port project over the expected area of the stomach. Two chest drains project over the mediastinum. Bilateral heterogeneous opacities, likely due to atelectasis pulmonary edema. No large pleural effusion or evidence of pneumothorax. IMPRESSION: Status post median sternotomy.  No evidence of pneumothorax. Electronically Signed   By: Yetta Glassman M.D.   On: 04/02/2021 13:52   CT ANGIO CHEST AORTA W/CM & OR WO/CM  Result Date: 03/21/2021 CLINICAL DATA:  Thoracic aortic aneurysm follow-up. EXAM: CT ANGIOGRAPHY CHEST WITH CONTRAST TECHNIQUE: Multidetector CT imaging of the chest was performed using the standard protocol during bolus administration of intravenous contrast. Multiplanar CT image reconstructions and MIPs were obtained to evaluate the vascular anatomy. CONTRAST:  54mL ISOVUE-370 IOPAMIDOL (ISOVUE-370) INJECTION 76% COMPARISON:  Chest radiograph, 03/27/2019 and 03/23/2014. NM cardiac study, 05/26/2013. CT chest report, 03/04/2003. FINDINGS: Cardiovascular:  Mild motion degradation. Thoracic Aorta: --Ascending Aorta: 3.5 cm --Aortic Arch: 2.6 cm --Descending Aorta: 2.2 cm Other: A severe burden of aortic valve calcifications are present. Normal heart size. No pericardial effusion. Mediastinum/Nodes: No enlarged mediastinal, hilar, or axillary lymph nodes. Thyroid gland, trachea, and esophagus demonstrate no significant findings. Lungs/Pleura:  RIGHT lower lobe calcified granuloma, likely sequela of prior granulomatous disease. Lungs are clear. No pleural effusion or pneumothorax. Upper Abdomen: No acute abnormality. Musculoskeletal: No chest wall abnormality. No acute or significant osseous findings. Review of the MIP images confirms the above findings. IMPRESSION: 1. Nonaneurysmal thoracic aorta, without significant calcified atherosclerotic disease. 2. Severe burden of aortic valve calcifications. Michaelle Birks, MD [facility]iology Electronically Signed   By: Michaelle Birks M.D.   On: 03/21/2021 10:28   ECHO INTRAOPERATIVE TEE  Result Date: 04/03/2021  *INTRAOPERATIVE TRANSESOPHAGEAL REPORT *  Patient Name:   Kevin Barron Date of Exam: 04/02/2021 Medical Rec #:  [id number]                Height:       63.0 in Accession #:    [id number]               Weight:       209.0 lb Date of Birth:  [birth year]                BSA:          1.97 m Patient Age:    59 years                 BP:           121/79 mmHg Patient Gender: M                        HR:           55 bpm. Exam Location:  Anesthesiology Transesophogeal exam was perform intraoperatively during surgical procedure. Patient was closely monitored under general anesthesia during the entirety of examination. Indications:     Aortic Valve Disease Sonographer:     Bernadene Person RDCS Performing Phys: [id number] Lucile Crater LIGHTFOOT Diagnosing Phys: Suzette Battiest MD Complications: No known complications during this procedure. POST-OP IMPRESSIONS _ Left Ventricle: The left ventricle is unchanged from pre-bypass. _ Right Ventricle: The right ventricle appears unchanged from pre-bypass. _ Aorta: The aorta appears unchanged from pre-bypass. _ Left Atrium: The left atrium appears unchanged from pre-bypass. _ Left Atrial Appendage: The left atrial appendage appears unchanged from pre-bypass. _ Aortic Valve: A mechanical valve was placed, leaflets are  freely mobile.Trivial anterior paravalvular leak noted. Mean post op gradient of 78mmHg. _ Mitral Valve: The mitral valve appears unchanged from pre-bypass. _ Tricuspid Valve: The tricuspid valve appears unchanged from pre-bypass. _ Pulmonic Valve: The pulmonic valve appears unchanged from pre-bypass. _ Interatrial Septum: The interatrial septum appears unchanged from pre-bypass. _ Interventricular Septum: The interventricular septum appears unchanged from pre-bypass. _ Pericardium: The pericardium appears unchanged from pre-bypass. PRE-OP FINDINGS  Left Ventricle: The left ventricle has low normal systolic function, with an ejection fraction of 50-55%. The cavity size was normal. There is mild concentric left ventricular hypertrophy. Right Ventricle: The right ventricle has normal systolic function. The cavity was normal. There is no increase in right ventricular wall thickness. Left Atrium: Left atrial size was dilated. No left atrial/left atrial appendage  thrombus was detected. Right Atrium: Right atrial size was normal in size. Interatrial Septum: No atrial level shunt detected by color flow Doppler. Pericardium: There is no evidence of pericardial effusion. Mitral Valve: The mitral valve is normal in structure. Mitral valve regurgitation is trivial by color flow Doppler. Tricuspid Valve: The tricuspid valve was normal in structure. Tricuspid valve regurgitation is trivial by color flow Doppler. Aortic Valve: The aortic valve is bicuspid Aortic valve regurgitation is moderate by color flow Doppler. There is severe stenosis of the aortic valve, with a calculated valve area of 0.83 cm. Pulmonic Valve: The pulmonic valve was normal in structure. Pulmonic valve regurgitation is trivial by color flow Doppler. Aorta: The aortic root, ascending aorta and aortic arch are normal in size and structure. +--------------+--------++ LEFT VENTRICLE         +--------------+--------++ PLAX 2D                 +--------------+--------++ LVOT diam:    2.30 cm  +--------------+--------++ LVOT Area:    4.15 cm +--------------+--------++                        +--------------+--------++ +------------------+------------++ AORTIC VALVE                   +------------------+------------++ AV Area (Vmax):   0.80 cm     +------------------+------------++ AV Area (Vmean):  0.79 cm     +------------------+------------++ AV Area (VTI):    0.83 cm     +------------------+------------++ AV Vmax:          287.50 cm/s  +------------------+------------++ AV Vmean:         218.000 cm/s +------------------+------------++ AV VTI:           0.738 m      +------------------+------------++ AV Peak Grad:     33.1 mmHg    +------------------+------------++ AV Mean Grad:     25.5 mmHg    +------------------+------------++ LVOT Vmax:        55.10 cm/s   +------------------+------------++ LVOT Vmean:       41.300 cm/s  +------------------+------------++ LVOT VTI:         0.147 m      +------------------+------------++ LVOT/AV VTI ratio:0.20         +------------------+------------++  +--------------+-------+ SHUNTS                +--------------+-------+ Systemic VTI: 0.15 m  +--------------+-------+ Systemic Diam:2.30 cm +--------------+-------+  Suzette Battiest MD Electronically signed by Suzette Battiest MD Signature Date/Time: 04/03/2021/10:43:57 AM    Final    VAS US DOPPLER PRE CABG  Result Date: 03/29/2021 PREOPERATIVE VASCULAR EVALUATION Patient Name:  Kevin Barron  Date of Exam:   03/29/2021 Medical Rec #: [id number]                 Accession #:    [id number] Date of Birth: [birth year]                 Patient Gender: M Patient Age:   37 years Exam Location:  [facility] Procedure:      VAS US DOPPLER PRE CABG Referring Phys: HARRELL LIGHTFOOT --------------------------------------------------------------------------------  Indications:             Pre-AVR. Risk Factors:           Hyperlipidemia, Diabetes. Vascular Interventions: AV stenosis. Comparison Study:       No previous exams Performing Technologist: Rogelia Rohrer RVT/RDMS  Examination Guidelines: A complete evaluation includes B-mode  imaging, spectral Doppler, color Doppler, and power Doppler as needed of all accessible portions of each vessel. Bilateral testing is considered an integral part of a complete examination. Limited examinations for reoccurring indications may be performed as noted.  Right Carotid Findings: +----------+--------+--------+--------+--------+-------------------+           PSV cm/sEDV cm/sStenosisDescribeComments            +----------+--------+--------+--------+--------+-------------------+ CCA Prox  76      19                      intimal thickening  +----------+--------+--------+--------+--------+-------------------+ CCA Distal68      17                      intimal thickening  +----------+--------+--------+--------+--------+-------------------+ ICA Prox  74      27                                          +----------+--------+--------+--------+--------+-------------------+ ICA Distal43      9                       not well visualized +----------+--------+--------+--------+--------+-------------------+ ECA       77      12                                          +----------+--------+--------+--------+--------+-------------------+ +----------+--------+-------+---------+------------+           PSV cm/sEDV cmsDescribe Arm Pressure +----------+--------+-------+---------+------------+ Subclavian127            Turbulent             +----------+--------+-------+---------+------------+ +---------+--------+--+--------+--+---------+ VertebralPSV cm/s40EDV cm/s15Antegrade +---------+--------+--+--------+--+---------+ Left Carotid Findings: +----------+--------+--------+--------+--------+-------------------+           PSV  cm/sEDV cm/sStenosisDescribeComments            +----------+--------+--------+--------+--------+-------------------+ CCA Prox  84      17                      intimal thickening  +----------+--------+--------+--------+--------+-------------------+ CCA Distal110     19                                          +----------+--------+--------+--------+--------+-------------------+ ICA Prox  57      23                                          +----------+--------+--------+--------+--------+-------------------+ ICA Distal55      22                      Not well visualized +----------+--------+--------+--------+--------+-------------------+ ECA       86      10                                          +----------+--------+--------+--------+--------+-------------------+ +----------+--------+--------+----------------+------------+ SubclavianPSV cm/sEDV cm/sDescribe        Arm Pressure +----------+--------+--------+----------------+------------+  80              Multiphasic, WNL             +----------+--------+--------+----------------+------------+ +---------+--------+--+--------+-+---------+ VertebralPSV cm/s36EDV cm/s9Antegrade +---------+--------+--+--------+-+---------+  ABI Findings: +--------+------------------+-----+---------+--------+ Right   Rt Pressure (mmHg)IndexWaveform Comment  +--------+------------------+-----+---------+--------+ Brachial                       triphasic         +--------+------------------+-----+---------+--------+ +--------+------------------+-----+---------+-------+ Left    Lt Pressure (mmHg)IndexWaveform Comment +--------+------------------+-----+---------+-------+ Brachial                       triphasic        +--------+------------------+-----+---------+-------+  Right Doppler Findings: +--------+--------+-----+---------+--------+ Site    PressureIndexDoppler  Comments  +--------+--------+-----+---------+--------+ Brachial             triphasic         +--------+--------+-----+---------+--------+ Radial               triphasic         +--------+--------+-----+---------+--------+ Ulnar                triphasic         +--------+--------+-----+---------+--------+  Left Doppler Findings: +--------+--------+-----+---------+--------+ Site    PressureIndexDoppler  Comments +--------+--------+-----+---------+--------+ Brachial             triphasic         +--------+--------+-----+---------+--------+ Radial               triphasic         +--------+--------+-----+---------+--------+ Ulnar                triphasic         +--------+--------+-----+---------+--------+  Summary: Right Carotid: The extracranial vessels were near-normal with only minimal wall                thickening or plaque. Left Carotid: The extracranial vessels were near-normal with only minimal wall               thickening or plaque. Vertebrals:  Bilateral vertebral arteries demonstrate antegrade flow. Subclavians: Right subclavian artery flow was disturbed. Normal flow              hemodynamics were seen in the left subclavian artery. Right Upper Extremity: Normal PPG waveforms with radial artery compression suggest palmar arch patency. Doppler waveforms remain within normal limits with right radial compression. Doppler waveforms remain within normal limits with right ulnar compression. Left Upper Extremity: Abnormal PPG waveforms with radial artery compression suggest an incomplete palmar arch. Doppler waveforms remain within normal limits with left radial compression. Doppler waveforms decrease >50% with left ulnar compression.  Electronically signed by Jamelle Haring on 03/29/2021 at 6:50:49 PM.    Final    Discharge Instructions     Amb Referral to Cardiac Rehabilitation   Complete by: As directed    Diagnosis: Valve Replacement   Valve: Aortic   After initial  evaluation and assessments completed: Virtual Based Care may be provided alone or in conjunction with Phase 2 Cardiac Rehab based on patient barriers.: Yes   Discharge patient   Complete by: As directed    Discharge disposition: 01-Home or Self Care   Discharge patient date: 04/07/2021       Discharge Medications: Allergies as of 04/07/2021   No Known Allergies      Medication List     TAKE these medications    aspirin EC 81 MG tablet Take  81 mg by mouth daily. Swallow whole.   atorvastatin 40 MG tablet Commonly known as: LIPITOR Take 1 tablet (40 mg total) by mouth daily. Tome una tableta por boca diaria What changed: when to take this   oxyCODONE 5 MG immediate release tablet Commonly known as: Oxy IR/ROXICODONE Take 1 tablet (5 mg total) by mouth every 6 (six) hours as needed for severe pain.   warfarin 2 MG tablet Commonly known as: Coumadin Take 0.5 tablets (1 mg total) by mouth daily. As adjusted by coumadin clinic based on INR blood test      The patient has been discharged on:   1.Beta Blocker:  Yes [   ]                              No   [  n ]                              If No, reason:  2.Ace Inhibitor/ARB: Yes [   ]                                     No  [   n ]                                     If No, reason:  3.Statin:   Yes [ X  ]                  No  [   ]                  If No, reason:  4.Ecasa:  Yes  [ X  ]                  No   [   ]                  If No, reason:  Patient had ACS upon admission:  Plavix/P2Y12 inhibitor: Yes [   ]                                      No  [ X  ]   Follow Up Appointments:  Follow-up Information     Satira Sark, MD Follow up.   Specialty: Cardiology Contact information: [place] [address] [phone]         Lajuana Matte, MD Follow up.   Specialty: Cardiothoracic Surgery Contact information: [address] [place]  [address] [place] Follow up on 04/09/2021.   Specialty: Cardiology Why: 10:00 AM for INR check Contact information: [place] [phone]        Darreld Mclean, PA-C Follow up on 04/11/2021.   Specialties: Physician Assistant, Cardiology Why: 3:15 pm for hospital follow up Contact information: [address] [place] [address] [place], [place] Follow up on 67/12/1948.  Why: @ 8:30 am with Yetta Glassman [place]-Please ask to speak with Sandrea Matte prior to end of visit for medication assistance. Please bring proof of income if you work, d/c summary, and wear a mask to the visit. Contact information: [place] [address] [place] [address] [phone]               He has an INR/PT appointment on October 3  Signed: Gaspar Bidding 04/08/2021, 9:10 AM

## 2021-04-02 NOTE — Brief Op Note (Signed)
04/02/2021  11:22 AM  PATIENT:  Kevin Barron  55 y.o. male  PRE-OPERATIVE DIAGNOSIS:  SEVERE AORTIC STENOSIS  POST-OPERATIVE DIAGNOSIS:  SEVERE AORTIC STENOSIS  PROCEDURE: TRANSESOPHAGEAL ECHOCARDIOGRAM (TEE), AORTIC VALVE REPLACEMENT (AVR) (USING MECHANICAL ON-X, Size 23MM, Serial # [id number])  APPLICATION OF CELL SAVER  SURGEON:  Surgeon(s) and Role:    Lightfoot, Lucile Crater, MD - Primary  PHYSICIAN ASSISTANT: Lars Pinks PA-C  ASSISTANTS: Alessandra Grout RNFA  ANESTHESIA:   general  EBL:  per perfusion, anesthesia record  DRAINS:  Chest tubes placed in the mediastinal and pleural spaces    SPECIMEN:  Source of Specimen:  Native AV leaflets  DISPOSITION OF SPECIMEN:  PATHOLOGY  COUNTS CORRECT:  YES  DICTATION: .Dragon Dictation  PLAN OF CARE: Admit to inpatient   PATIENT DISPOSITION:  ICU - intubated and hemodynamically stable.   Delay start of Pharmacological VTE agent (>24hrs) due to surgical blood loss or risk of bleeding: yes  BASELINE WEIGHT: 94.8 kg

## 2021-04-02 NOTE — Progress Notes (Signed)
      [address]       [place] [address]             [phone]      S/p AVR  BP 99/61   Pulse 70   Temp (!) 100.4 F (38 C)   Resp 12   Ht 5\' 3"  (1.6 m)   Wt 94.8 kg   SpO2 99%   BMI 37.02 kg/m  CI= 2.6   Intake/Output Summary (Last 24 hours) at 04/02/2021 1805 Last data filed at 04/02/2021 1700 Gross per 24 hour  Intake 3889.1 ml  Output 3830 ml  Net 59.1 ml   Hct 36  Doing well s/p AVR Extubated  Remo Lipps C. Roxan Hockey, MD [facility] [phone]

## 2021-04-02 NOTE — Hospital Course (Addendum)
HPI: This is a 55 year old male who was referred after TAVR evaluation for bicuspid aortic valve, and severe aortic stenosis.  He has experienced exertional dyspnea, and some chest pain over the last several months.  Recently, he did have some presyncopal episodes.  He is undergone a left heart catheter did not show any significant coronary disease. CTA of the chest showed nonaneurysmal thoracic aorta, without significant calcified atherosclerotic disease and severe aortic valve calcifications. Pre operative carotid duplex US showed no significant internal carotid artery stenosis bilaterally. He was also see by Dr. Benson Norway and there was no dental intervention needed. Dr. Kipp Brood discussed the need for aortic valve replacement via an interpreter. Potential risks, benefits, and complications of the surgery were discussed with the patient and he agreed to proceed with surgery.  Hospital Course: Patient underwent an aortic valve replacement on 04/02/2021. Patient was transferred from the OR to [facility] ICU in stable condition.  His vital signs and hemodynamics remained stable after transfer.  He was weaned from the mechanical ventilator and extubated by 5:30 PM on the day of surgery. The following day, drain lines were removed.  Chest tubes were left in place for drainage.  Anticoagulation was with Coumadin was initiated on postop day 1 for his mechanical prosthetic aortic valve.  Daily INR was monitored in dosing adjustments made accordingly.  He did not develop post operative heart block or arrhythmia and his pacing wires were removed without difficulty. Chest tube output remained low and these were also removed.  He was maintaining NSR and transferred to progressive care unit on 04/04/2021.  The patient continued to make good progress.  He remains on coumadin at 1 mg daily at time of discharge.  His most recent INR is 2.8 with a goal of 2.0-2.5 x 3 months, then this can decrease to 1.8 after that time.  The patient is  ambulating without difficulty.  His surgical incisions are healing without evidence of infection.  He is medically stable for discharge home today.

## 2021-04-02 NOTE — Transfer of Care (Signed)
Immediate Anesthesia Transfer of Care Note  Patient: Kevin Barron  Procedure(s) Performed: AORTIC VALVE REPLACEMENT (AVR) USING ON-X 23MM PROSTHETIC HEART VALVE (Chest) TRANSESOPHAGEAL ECHOCARDIOGRAM (TEE) APPLICATION OF CELL SAVER  Patient Location: ICU  Anesthesia Type:General  Level of Consciousness: sedated, unresponsive and Patient remains intubated per anesthesia plan  Airway & Oxygen Therapy: Patient remains intubated per anesthesia plan and Patient placed on Ventilator (see vital sign flow sheet for setting)  Post-op Assessment: Report given to RN and Post -op Vital signs reviewed and stable  Post vital signs: Reviewed and stable  Last Vitals:  Vitals Value Taken Time  BP    Temp    Pulse 65 04/02/21 1258  Resp 14 04/02/21 1258  SpO2 98 % 04/02/21 1258  Vitals shown include unvalidated device data.  Last Pain:  Vitals:   04/02/21 0605  TempSrc:   PainSc: 0-No pain         Complications: No notable events documented.

## 2021-04-02 NOTE — Progress Notes (Signed)
Eddie, [place] interpreter, present throughout pre-procedure care

## 2021-04-02 NOTE — Progress Notes (Signed)
  Echocardiogram Echocardiogram Transesophageal has been performed.  Kevin Barron 04/02/2021, 9:17 AM

## 2021-04-02 NOTE — Interval H&P Note (Signed)
History and Physical Interval Note:  04/02/2021 7:08 AM  Kevin Barron  has presented today for surgery, with the diagnosis of SEVERE AS.  The various methods of treatment have been discussed with the patient and family. After consideration of risks, benefits and other options for treatment, the patient has consented to  Procedure(s): AORTIC VALVE REPLACEMENT (AVR) (N/A) TRANSESOPHAGEAL ECHOCARDIOGRAM (TEE) (N/A) as a surgical intervention.  The patient's history has been reviewed, patient examined, no change in status, stable for surgery.  I have reviewed the patient's chart and labs.  Questions were answered to the patient's satisfaction.     Kevin Barron

## 2021-04-02 NOTE — Op Note (Signed)
[address]       [place] [address]             [phone]                                           04/02/2021 Patient:  Kevin Barron Pre-Op Dx: Severe aortic stenosis   Bicuspid aortic valve Post-op Dx: Same Procedure: Aortic valve replacement with a 22mm On-x valve     Surgeon and Role:      * Natoya Viscomi, Lucile Crater, MD - Primary  Assistant: D.Zimmerman, PA-C  Anesthesia  general EBL:  76ml Blood Administration: none Xclamp Time:  85 min Pump Time:  131min  Drains: 7 F blake drain: mediastinal x 2  Wires: Ventricular Counts: correct   Indications: 55 yo male with severe AS, bicuspid valve.  He had a very long discussion via an interpreter about the risks and benefits of aortic valve replacement.  He is agreeable to proceed and would like to undergo a mechanical valve replacement.    Findings: Heavily calcified bicuspid aortic valve.  Fusion of the right and left coronary cusps.  Operative Technique: All invasive lines were placed in pre-op holding.  After the risks, benefits and alternatives were thoroughly discussed, the patient was brought to the operative theatre.  Anesthesia was induced, and the patient was prepped and draped in normal sterile fashion.  An appropriate surgical pause was performed, and pre-operative antibiotics were dosed accordingly.  We began with an incision over the chest for the sternotomy.  This was carried down with bovie cautery, and the sternum was divided with a reciprocating saw.  Meticulous hemostasis was obtained.  The patient was systemically heparinized.   The sternal retractor was placed.  The pericardium was divided in the midline and fashioned into a cradle with pericardial stitches.   After we confirmed an appropriate ACT, the ascending aorta was cannulated in standard fashion.  The right atrial appendage was used for venous cannulation site.  Cardiopulmonary bypass was initiated and we began to  cool the patient to 32 degrees. The cross clamp was applied, and a dose of anterograde cardioplegia was given with good arrest of the heart.  Our aortotomy was made and directed toward the non coronary cusp.  The valve was inspected.  The valve is bicuspid with fusion of the right and left coronary cusp.  It was heavily calcified..  All leaflets were excised.  The annulus was sized to a 23 mm On-X valve.  The left ventricle was then copiously irrigated.  Pledgeted mattress sutures were placed circumferentially through the annulus.  These sutures were then passed through the sewing ring of the valve.  Once the valve was seated in the annulus, it was secured with Core-knot sutures.  We began to rewarm, and close our aortotomy in 2 layers.  A re-animation dose of cardioplegia was given.  After de-airing the heart, the aortic cross clamp was removed.  We checked our valve function, and for air using the TEE.  Once we were satisfying, we separated from cardiopulmonary bypass without event.    The heparin was reversed with protamine, and hemostasis was obtained.  Chest tubes and wires were placed, and the sternum was re-approximated with with sternal wires.  The soft tissue and skin were re-approximated wth absorbable suture.    The patient tolerated the procedure  without any immediate complications, and was transferred to the ICU in guarded condition.  Avien Taha Bary Leriche

## 2021-04-02 NOTE — Procedures (Signed)
Extubation Procedure Note  Patient Details:   Name: Kevin Barron DOB: 01-16-1966 MRN: [id number]   Airway Documentation:    Vent end date: 04/02/21 Vent end time: 1722   Evaluation  O2 sats: stable throughout Complications: No apparent complications Patient did tolerate procedure well. Bilateral Breath Sounds: Clear, Diminished   Pt extubated to 4L [place] per rapid wean protocol. NIF was -20 and VC 732ml. Pt had positive cuff leak prior to extubation. No stridor noted and pt able to voice his name.  Vilinda Blanks 04/02/2021, 5:23 PM

## 2021-04-02 NOTE — Anesthesia Procedure Notes (Signed)
Central Venous Catheter Insertion Performed by: Suzette Battiest, MD, anesthesiologist Start/End9/26/2022 7:05 AM, 04/02/2021 7:15 AM Patient location: Pre-op. Preanesthetic checklist: patient identified, IV checked, site marked, risks and benefits discussed, surgical consent, monitors and equipment checked, pre-op evaluation, timeout performed and anesthesia consent Position: Trendelenburg Lidocaine 1% used for infiltration and patient sedated Hand hygiene performed , maximum sterile barriers used  and Seldinger technique used Catheter size: 8.5 Fr Total catheter length 10. Central line was placed.Sheath introducer Swan type:thermodilution Procedure performed using ultrasound guided technique. Ultrasound Notes:anatomy identified, needle tip was noted to be adjacent to the nerve/plexus identified, no ultrasound evidence of intravascular and/or intraneural injection and image(s) printed for medical record Attempts: 1 Following insertion, line sutured, dressing applied and Biopatch. Post procedure assessment: blood return through all ports, free fluid flow and no air  Patient tolerated the procedure well with no immediate complications. Additional procedure comments: Triple lumen slic catheter inserted through introducer port. Blood aspirated through all 3 ports. Line secured.Marland Kitchen

## 2021-04-02 NOTE — Anesthesia Procedure Notes (Signed)
Procedure Name: Intubation Date/Time: 04/02/2021 8:22 AM Performed by: Harden Mo, CRNA Pre-anesthesia Checklist: Patient identified, Emergency Drugs available, Suction available and Patient being monitored Patient Re-evaluated:Patient Re-evaluated prior to induction Oxygen Delivery Method: Circle System Utilized Preoxygenation: Pre-oxygenation with 100% oxygen Induction Type: IV induction Ventilation: Mask ventilation with difficulty, Two handed mask ventilation required and Oral airway inserted - appropriate to patient size Laryngoscope Size: Glidescope and 4 Grade View: Grade I Tube type: Oral Tube size: 8.0 mm Number of attempts: 1 Airway Equipment and Method: Stylet and Oral airway Placement Confirmation: ETT inserted through vocal cords under direct vision, positive ETCO2 and breath sounds checked- equal and bilateral Secured at: 23 cm Tube secured with: Tape Dental Injury: Teeth and Oropharynx as per pre-operative assessment

## 2021-04-02 NOTE — Anesthesia Postprocedure Evaluation (Signed)
Anesthesia Post Note  Patient: Kevin Barron  Procedure(s) Performed: AORTIC VALVE REPLACEMENT (AVR) USING ON-X 23MM PROSTHETIC HEART VALVE (Chest) TRANSESOPHAGEAL ECHOCARDIOGRAM (TEE) APPLICATION OF CELL SAVER     Patient location during evaluation: SICU Anesthesia Type: General Level of consciousness: sedated Pain management: pain level controlled Vital Signs Assessment: post-procedure vital signs reviewed and stable Respiratory status: patient remains intubated per anesthesia plan Cardiovascular status: stable Postop Assessment: no apparent nausea or vomiting Anesthetic complications: no   No notable events documented.  Last Vitals:  Vitals:   04/02/21 1500 04/02/21 1503  BP: 105/64   Pulse: 66 66  Resp: 12 12  Temp: 37.5 C 37.5 C  SpO2: 100% 100%    Last Pain:  Vitals:   04/02/21 0605  TempSrc:   PainSc: 0-No pain                 Tiajuana Amass

## 2021-04-02 NOTE — Discharge Instructions (Addendum)
Discharge Instructions:  1. You may shower, please wash incisions daily with soap and water and keep dry.  If you wish to cover wounds with dressing you may do so but please keep clean and change daily.  No tub baths or swimming until incisions have completely healed.  If your incisions become red or develop any drainage please call our office at [phone]  2. No Driving until cleared by Dr. Abran Duke office and you are no longer using narcotic pain medications  3. Monitor your weight daily.. Please use the same scale and weigh at same time... If you gain 5-10 lbs in 48 hours with associated lower extremity swelling, please contact our office at [phone]  4. Fever of 101.5 for at least 24 hours with no source, please contact our office at [phone]  5. Activity- up as tolerated, please walk at least 3 times per day.  Avoid strenuous activity, no lifting, pushing, or pulling with your arms over 8-10 lbs for a minimum of 6 weeks  6. If any questions or concerns arise, please do not hesitate to contact our office at [phone]  Information on my medicine - Coumadin   (Warfarin)  This medication education was reviewed with me or my healthcare representative as part of my discharge preparation.  The pharmacist that spoke with me during my hospital stay was:  Einar Grad, [facility]  Why was Coumadin prescribed for you? Coumadin was prescribed for you because you have a blood clot or a medical condition that can cause an increased risk of forming blood clots. Blood clots can cause serious health problems by blocking the flow of blood to the heart, lung, or brain. Coumadin can prevent harmful blood clots from forming. As a reminder your indication for Coumadin is:  Blood Clot Prevention after Heart Valve Surgery  What test will check on my response to Coumadin? While on Coumadin (warfarin) you will need to have an INR test regularly to ensure that your dose is keeping you in the desired  range. The INR (international normalized ratio) number is calculated from the result of the laboratory test called prothrombin time (PT).  If an INR APPOINTMENT HAS NOT ALREADY BEEN MADE FOR YOU please schedule an appointment to have this lab work done by your health care provider within 7 days. Your INR goal is usually a number between:  2 to 3 or your provider may give you a more narrow range like 2-2.5.  Ask your health care provider during an office visit what your goal INR is.  What  do you need to  know  About  COUMADIN? Take Coumadin (warfarin) exactly as prescribed by your healthcare provider about the same time each day.  DO NOT stop taking without talking to the doctor who prescribed the medication.  Stopping without other blood clot prevention medication to take the place of Coumadin may increase your risk of developing a new clot or stroke.  Get refills before you run out.  What do you do if you miss a dose? If you miss a dose, take it as soon as you remember on the same day then continue your regularly scheduled regimen the next day.  Do not take two doses of Coumadin at the same time.  Important Safety Information A possible side effect of Coumadin (Warfarin) is an increased risk of bleeding. You should call your healthcare provider right away if you experience any of the following: Bleeding from an injury or your nose that does not stop.  Unusual colored urine (red or dark brown) or unusual colored stools (red or black). Unusual bruising for unknown reasons. A serious fall or if you hit your head (even if there is no bleeding).  Some foods or medicines interact with Coumadin (warfarin) and might alter your response to warfarin. To help avoid this: Eat a balanced diet, maintaining a consistent amount of Vitamin K. Notify your provider about major diet changes you plan to make. Avoid alcohol or limit your intake to 1 drink for women and 2 drinks for men per day. (1 drink is 5 oz.  wine, 12 oz. beer, or 1.5 oz. liquor.)  Make sure that ANY health care provider who prescribes medication for you knows that you are taking Coumadin (warfarin).  Also make sure the healthcare provider who is monitoring your Coumadin knows when you have started a new medication including herbals and non-prescription products.  Coumadin (Warfarin)  Major Drug Interactions  Increased Warfarin Effect Decreased Warfarin Effect  Alcohol (large quantities) Antibiotics (esp. Septra/Bactrim, Flagyl, Cipro) Amiodarone (Cordarone) Aspirin (ASA) Cimetidine (Tagamet) Megestrol (Megace) NSAIDs (ibuprofen, naproxen, etc.) Piroxicam (Feldene) Propafenone (Rythmol SR) Propranolol (Inderal) Isoniazid (INH) Posaconazole (Noxafil) Barbiturates (Phenobarbital) Carbamazepine (Tegretol) Chlordiazepoxide (Librium) Cholestyramine (Questran) Griseofulvin Oral Contraceptives Rifampin Sucralfate (Carafate) Vitamin K   Coumadin (Warfarin) Major Herbal Interactions  Increased Warfarin Effect Decreased Warfarin Effect  Garlic Ginseng Ginkgo biloba Coenzyme Q10 Green tea St. John's wort    Coumadin (Warfarin) FOOD Interactions  Eat a consistent number of servings per week of foods HIGH in Vitamin K (1 serving =  cup)  Collards (cooked, or boiled & drained) Kale (cooked, or boiled & drained) Mustard greens (cooked, or boiled & drained) Parsley *serving size only =  cup Spinach (cooked, or boiled & drained) Swiss chard (cooked, or boiled & drained) Turnip greens (cooked, or boiled & drained)  Eat a consistent number of servings per week of foods MEDIUM-HIGH in Vitamin K (1 serving = 1 cup)  Asparagus (cooked, or boiled & drained) Broccoli (cooked, boiled & drained, or raw & chopped) Brussel sprouts (cooked, or boiled & drained) *serving size only =  cup Lettuce, raw (green leaf, endive, romaine) Spinach, raw Turnip greens, raw & chopped   These websites have more information on Coumadin  (warfarin):  FailFactory.se; VeganReport.com.au;

## 2021-04-02 NOTE — Anesthesia Procedure Notes (Signed)
Arterial Line Insertion Start/End9/26/2022 7:25 AM, 04/02/2021 7:35 AM Performed by: Suzette Battiest, MD, anesthesiologist  Patient location: Pre-op. Preanesthetic checklist: patient identified, IV checked, site marked, risks and benefits discussed, surgical consent, monitors and equipment checked, pre-op evaluation, timeout performed and anesthesia consent Lidocaine 1% used for infiltration Left, brachial was placed Catheter size: 20 G Hand hygiene performed  and maximum sterile barriers used   Attempts: 2 Procedure performed using ultrasound guided technique. Ultrasound Notes:anatomy identified, needle tip was noted to be adjacent to the nerve/plexus identified, no ultrasound evidence of intravascular and/or intraneural injection and image(s) printed for medical record Following insertion, dressing applied and Biopatch. Post procedure assessment: normal and unchanged  Patient tolerated the procedure well with no immediate complications.

## 2021-04-02 NOTE — Anesthesia Preprocedure Evaluation (Signed)
Anesthesia Evaluation  Patient identified by MRN, date of birth, ID band Patient awake    Reviewed: Allergy & Precautions, NPO status , Patient's Chart, lab work & pertinent test results  Airway Mallampati: II  TM Distance: >3 FB Neck ROM: Full    Dental  (+) Dental Advisory Given   Pulmonary former smoker,    breath sounds clear to auscultation       Cardiovascular + angina + Valvular Problems/Murmurs AS  Rhythm:Regular Rate:Normal     Neuro/Psych negative neurological ROS     GI/Hepatic negative GI ROS, Neg liver ROS,   Endo/Other  diabetes, Type 2  Renal/GU negative Renal ROS     Musculoskeletal   Abdominal   Peds  Hematology negative hematology ROS (+)   Anesthesia Other Findings   Reproductive/Obstetrics                             Lab Results  Component Value Date   WBC 7.9 03/29/2021   HGB 14.5 03/29/2021   HCT 41.9 03/29/2021   MCV 91.9 03/29/2021   PLT 255 03/29/2021   Lab Results  Component Value Date   CREATININE 0.70 03/29/2021   BUN 20 03/29/2021   NA 135 03/29/2021   K 3.9 03/29/2021   CL 103 03/29/2021   CO2 24 03/29/2021    Anesthesia Physical Anesthesia Plan  ASA: 4  Anesthesia Plan: General   Post-op Pain Management:    Induction: Intravenous  PONV Risk Score and Plan: 2 and Dexamethasone, Ondansetron and Treatment may vary due to age or medical condition  Airway Management Planned: Oral ETT  Additional Equipment: Arterial line, CVP and TEE  Intra-op Plan:   Post-operative Plan: Post-operative intubation/ventilation  Informed Consent: I have reviewed the patients History and Physical, chart, labs and discussed the procedure including the risks, benefits and alternatives for the proposed anesthesia with the patient or authorized representative who has indicated his/her understanding and acceptance.     Dental advisory given  Plan Discussed  with: CRNA  Anesthesia Plan Comments:         Anesthesia Quick Evaluation

## 2021-04-03 ENCOUNTER — Inpatient Hospital Stay (HOSPITAL_COMMUNITY): Payer: Self-pay

## 2021-04-03 ENCOUNTER — Encounter (HOSPITAL_COMMUNITY): Payer: Self-pay | Admitting: Thoracic Surgery (Cardiothoracic Vascular Surgery)

## 2021-04-03 LAB — CBC
HCT: 35.4 % — ABNORMAL LOW (ref 39.0–52.0)
HCT: 34.8 % — ABNORMAL LOW (ref 39.0–52.0)
Hemoglobin: 12.1 g/dL — ABNORMAL LOW (ref 13.0–17.0)
Hemoglobin: 11.8 g/dL — ABNORMAL LOW (ref 13.0–17.0)
MCH: 31.3 pg (ref 26.0–34.0)
MCH: 31.8 pg (ref 26.0–34.0)
MCHC: 34.2 g/dL (ref 30.0–36.0)
MCHC: 33.9 g/dL (ref 30.0–36.0)
MCV: 91.7 fL (ref 80.0–100.0)
MCV: 93.8 fL (ref 80.0–100.0)
Platelets: 201 10*3/uL (ref 150–400)
Platelets: 205 10*3/uL (ref 150–400)
RBC: 3.86 MIL/uL — ABNORMAL LOW (ref 4.22–5.81)
RBC: 3.71 MIL/uL — ABNORMAL LOW (ref 4.22–5.81)
RDW: 13.3 % (ref 11.5–15.5)
RDW: 13.5 % (ref 11.5–15.5)
WBC: 17.9 10*3/uL — ABNORMAL HIGH (ref 4.0–10.5)
WBC: 18.6 10*3/uL — ABNORMAL HIGH (ref 4.0–10.5)
nRBC: 0 % (ref 0.0–0.2)
nRBC: 0 % (ref 0.0–0.2)

## 2021-04-03 LAB — BASIC METABOLIC PANEL
Anion gap: 6 (ref 5–15)
Anion gap: 7 (ref 5–15)
BUN: 11 mg/dL (ref 6–20)
BUN: 15 mg/dL (ref 6–20)
CO2: 22 mmol/L (ref 22–32)
CO2: 25 mmol/L (ref 22–32)
Calcium: 7.1 mg/dL — ABNORMAL LOW (ref 8.9–10.3)
Calcium: 7.8 mg/dL — ABNORMAL LOW (ref 8.9–10.3)
Chloride: 107 mmol/L (ref 98–111)
Chloride: 99 mmol/L (ref 98–111)
Creatinine, Ser: 0.61 mg/dL (ref 0.61–1.24)
Creatinine, Ser: 0.68 mg/dL (ref 0.61–1.24)
GFR, Estimated: >60 mL/min (ref >60)
GFR, Estimated: >60 mL/min (ref >60)
Glucose, Bld: 132 mg/dL — ABNORMAL HIGH (ref 70–99)
Glucose, Bld: 133 mg/dL — ABNORMAL HIGH (ref 70–99)
Potassium: 3.4 mmol/L — ABNORMAL LOW (ref 3.5–5.1)
Potassium: 4.3 mmol/L (ref 3.5–5.1)
Sodium: 135 mmol/L (ref 135–145)
Sodium: 131 mmol/L — ABNORMAL LOW (ref 135–145)

## 2021-04-03 LAB — GLUCOSE, CAPILLARY
Glucose-Capillary: 102 mg/dL — ABNORMAL HIGH (ref 70–99)
Glucose-Capillary: 121 mg/dL — ABNORMAL HIGH (ref 70–99)
Glucose-Capillary: 132 mg/dL — ABNORMAL HIGH (ref 70–99)
Glucose-Capillary: 134 mg/dL — ABNORMAL HIGH (ref 70–99)
Glucose-Capillary: 134 mg/dL — ABNORMAL HIGH (ref 70–99)
Glucose-Capillary: 136 mg/dL — ABNORMAL HIGH (ref 70–99)
Glucose-Capillary: 144 mg/dL — ABNORMAL HIGH (ref 70–99)
Glucose-Capillary: 147 mg/dL — ABNORMAL HIGH (ref 70–99)
Glucose-Capillary: 153 mg/dL — ABNORMAL HIGH (ref 70–99)

## 2021-04-03 LAB — ECHO INTRAOPERATIVE TEE
AR max vel: 0.8 cm2
AV Area VTI: 0.83 cm2
AV Area mean vel: 0.79 cm2
AV Mean grad: 25.5 mmHg
AV Peak grad: 33.1 mmHg
Ao pk vel: 2.88 m/s
Height: 63 in
Weight: 3344 oz

## 2021-04-03 LAB — MAGNESIUM: Magnesium: 1.6 mg/dL — ABNORMAL LOW (ref 1.7–2.4)

## 2021-04-03 MED ORDER — INSULIN ASPART 100 UNIT/ML IJ SOLN
0.0000 [IU] | INTRAMUSCULAR | Status: DC
  Administered 2021-04-03 (×4): 2 [IU] via SUBCUTANEOUS
  Administered 2021-04-04: 4 [IU] via SUBCUTANEOUS
  Administered 2021-04-04 – 2021-04-05 (×2): 2 [IU] via SUBCUTANEOUS
  Administered 2021-04-05: 4 [IU] via SUBCUTANEOUS
  Administered 2021-04-05 – 2021-04-06 (×2): 2 [IU] via SUBCUTANEOUS

## 2021-04-03 MED ORDER — INSULIN ASPART 100 UNIT/ML IJ SOLN: 0.0000 [IU] | INTRAMUSCULAR | Status: DC

## 2021-04-03 MED ORDER — LIDOCAINE 5 % EX PTCH
2.0000 | MEDICATED_PATCH | CUTANEOUS | Status: DC
  Administered 2021-04-04 – 2021-04-06 (×3): 2 via TRANSDERMAL
  Filled 2021-04-03 (×3): qty 2

## 2021-04-03 MED ORDER — WARFARIN SODIUM 5 MG PO TABS
5.0000 mg | ORAL_TABLET | Freq: Every day | ORAL | Status: DC
  Administered 2021-04-03: 5 mg via ORAL
  Filled 2021-04-03: qty 1

## 2021-04-03 MED ORDER — METHOCARBAMOL 500 MG PO TABS
500.0000 mg | ORAL_TABLET | Freq: Three times a day (TID) | ORAL | Status: AC
  Administered 2021-04-03 – 2021-04-06 (×9): 500 mg via ORAL
  Filled 2021-04-03 (×10): qty 1

## 2021-04-03 MED ORDER — MAGNESIUM SULFATE 2 GM/50ML IV SOLN
2.0000 g | Freq: Once | INTRAVENOUS | Status: AC
  Administered 2021-04-03: 2 g via INTRAVENOUS
  Filled 2021-04-03: qty 50

## 2021-04-03 MED ORDER — POTASSIUM CHLORIDE 10 MEQ/50ML IV SOLN
10.0000 meq | INTRAVENOUS | Status: AC
  Administered 2021-04-03 (×3): 10 meq via INTRAVENOUS

## 2021-04-03 MED ORDER — ORAL CARE MOUTH RINSE
15.0000 mL | Freq: Two times a day (BID) | OROMUCOSAL | Status: DC
  Administered 2021-04-03 – 2021-04-06 (×6): 15 mL via OROMUCOSAL

## 2021-04-03 MED ORDER — WARFARIN - PHYSICIAN DOSING INPATIENT: Freq: Every day | Status: DC

## 2021-04-03 MED ORDER — ENOXAPARIN SODIUM 40 MG/0.4ML IJ SOSY
40.0000 mg | PREFILLED_SYRINGE | Freq: Every day | INTRAMUSCULAR | Status: DC
  Administered 2021-04-03: 40 mg via SUBCUTANEOUS
  Filled 2021-04-03: qty 0.4

## 2021-04-03 MED ORDER — POTASSIUM CHLORIDE 10 MEQ/50ML IV SOLN
INTRAVENOUS | Status: AC
  Filled 2021-04-03: qty 150

## 2021-04-03 MED FILL — Potassium Chloride Inj 2 mEq/ML: INTRAVENOUS | Qty: 40 | Status: AC

## 2021-04-03 MED FILL — Lidocaine HCl Local Preservative Free (PF) Inj 2%: INTRAMUSCULAR | Qty: 15 | Status: AC

## 2021-04-03 MED FILL — Heparin Sodium (Porcine) Inj 1000 Unit/ML: Qty: 1000 | Status: AC

## 2021-04-03 NOTE — Progress Notes (Signed)
      [address]       [place] [address]             [phone]      1 Day Post-Op  Procedure(s) (LRB): AORTIC VALVE REPLACEMENT (AVR) USING ON-X 23MM PROSTHETIC HEART VALVE (N/A) TRANSESOPHAGEAL ECHOCARDIOGRAM (TEE) (N/A) APPLICATION OF CELL SAVER   Total Length of Stay:  LOS: 1 day    SUBJECTIVE: Resting in bed. No events today.   Stable VS and cardiac rhythm.  Miinimal chest tube output. Vitals:   04/03/21 1430 04/03/21 1445  BP:    Pulse: 82 81  Resp: 13 12  Temp:    SpO2: 93% 92%    Intake/Output      09/26 0701 09/27 0700 09/27 0701 09/28 0700   P.O.  120   I.V. (mL/kg) 3435.4 (35.5) 144.4 (1.5)   Blood 550    IV Piggyback 1431.8 106.1   Total Intake(mL/kg) 5417.2 (55.9) 370.6 (3.8)   Urine (mL/kg/hr) 3825 (1.6) 70 (0.1)   Emesis/NG output 50    Blood 780    Chest Tube 370 140   Total Output 5025 210   Net +392.2 +160.6            sodium chloride 20 mL/hr at 04/03/21 1207   sodium chloride     sodium chloride      ceFAZolin (ANCEF) IV 2 g (04/03/21 1334)   insulin Stopped (04/03/21 0810)   lactated ringers     lactated ringers     lactated ringers Stopped (04/03/21 0950)   phenylephrine (NEO-SYNEPHRINE) Adult infusion Stopped (04/03/21 0754)   potassium chloride      CBC    Component Value Date/Time   WBC 17.9 (H) 04/03/2021 0331   RBC 3.86 (L) 04/03/2021 0331   HGB 12.1 (L) 04/03/2021 0331   HCT 35.4 (L) 04/03/2021 0331   PLT 201 04/03/2021 0331   MCV 91.7 04/03/2021 0331   MCH 31.3 04/03/2021 0331   MCHC 34.2 04/03/2021 0331   RDW 13.3 04/03/2021 0331   LYMPHSABS 2.0 05/12/2017 1036   MONOABS 0.6 05/12/2017 1036   EOSABS 0.2 05/12/2017 1036   BASOSABS 0.0 05/12/2017 1036   CMP     Component Value Date/Time   NA 135 04/03/2021 0331   K 3.4 (L) 04/03/2021 0331   CL 107 04/03/2021 0331   CO2 22 04/03/2021 0331   GLUCOSE 132 (H) 04/03/2021 0331   BUN 11 04/03/2021 0331   CREATININE 0.61 04/03/2021 0331    CALCIUM 7.1 (L) 04/03/2021 0331   PROT 6.9 03/29/2021 1500   ALBUMIN 3.8 03/29/2021 1500   AST 33 03/29/2021 1500   ALT 31 03/29/2021 1500   ALKPHOS 49 03/29/2021 1500   BILITOT 0.9 03/29/2021 1500   GFRNONAA >60 04/03/2021 0331   GFRAA >60 03/27/2020 1007   ABG    Component Value Date/Time   PHART 7.345 (L) 04/02/2021 1808   PCO2ART 41.9 04/02/2021 1808   PO2ART 134 (H) 04/02/2021 1808   HCO3 22.6 04/02/2021 1808   TCO2 24 04/02/2021 1808   ACIDBASEDEF 3.0 (H) 04/02/2021 1808   O2SAT 99.0 04/02/2021 1808   CBG (last 3)  Recent Labs    04/03/21 0547 04/03/21 0750 04/03/21 1130  GLUCAP 121* 132* 147*     ASSESSMENT:  Stable day. No change in mgt.   [name], PA-C

## 2021-04-03 NOTE — Plan of Care (Signed)
  Problem: Cardiac: Goal: Will achieve and/or maintain hemodynamic stability Outcome: Progressing   Problem: Respiratory: Goal: Respiratory status will improve Outcome: Progressing   Problem: Urinary Elimination: Goal: Ability to achieve and maintain adequate renal perfusion and functioning will improve Outcome: Progressing

## 2021-04-03 NOTE — Progress Notes (Addendum)
TCTS DAILY ICU PROGRESS NOTE                   [address]            Kevin Barron,[place] [address]          [phone]   1 Day Post-Op Procedure(s) (LRB): AORTIC VALVE REPLACEMENT (AVR) USING ON-X 23MM PROSTHETIC HEART VALVE (N/A) TRANSESOPHAGEAL ECHOCARDIOGRAM (TEE) (N/A) APPLICATION OF CELL SAVER  Total Length of Stay:  LOS: 1 day   Subjective: Extubated by 5:30 p.m. yesterday. Awake and alert, no major events since surgery  Objective: Vital signs in last 24 hours: Temp:  [98.8 F (37.1 C)-100.6 F (38.1 C)] 99.9 F (37.7 C) (09/27 0700) Pulse Rate:  [63-84] 81 (09/27 0700) Cardiac Rhythm: Normal sinus rhythm (09/27 0000) Resp:  [12-24] 15 (09/27 0700) BP: (87-131)/(50-79) 121/71 (09/27 0700) SpO2:  [93 %-100 %] 93 % (09/27 0700) Arterial Line BP: (92-127)/(50-108) 107/56 (09/27 0700) FiO2 (%):  [4 %-50 %] 4 % (09/26 1720) Weight:  [96.9 kg] 96.9 kg (09/27 0600)  Filed Weights   04/02/21 0552 04/03/21 0600  Weight: 94.8 kg 96.9 kg    Weight change: 2.098 kg   Hemodynamic parameters for last 24 hours: CVP:  [0 mmHg-61 mmHg] 5 mmHg  Intake/Output from previous day: 09/26 0701 - 09/27 0700 In: 5417.2 [I.V.:3435.4; Blood:550; IV Piggyback:1431.8] Out: 5035 [Urine:3825; Emesis/NG output:50; Blood:780; Chest Tube:370]  Intake/Output this shift: No intake/output data recorded.  Current Meds: Scheduled Meds:  acetaminophen  1,000 mg Oral Q6H   Or   acetaminophen (TYLENOL) oral liquid 160 mg/5 mL  1,000 mg Per Tube Q6H   aspirin EC  325 mg Oral Daily   Or   aspirin  324 mg Per Tube Daily   atorvastatin  40 mg Oral Q supper   bisacodyl  10 mg Oral Daily   Or   bisacodyl  10 mg Rectal Daily   chlorhexidine  15 mL Mouth Rinse BID   Chlorhexidine Gluconate Cloth  6 each Topical Daily   docusate sodium  200 mg Oral Daily   enoxaparin (LOVENOX) injection  40 mg Subcutaneous QHS   insulin aspart  0-24 Units Subcutaneous Q4H   mouth rinse  15 mL Mouth  Rinse q12n4p   [START ON 04/04/2021] pantoprazole  40 mg Oral Daily   sodium chloride flush  10-40 mL Intracatheter Q12H   sodium chloride flush  3 mL Intravenous Q12H   warfarin  5 mg Oral q1600   Warfarin - Physician Dosing Inpatient   Does not apply q1600   Continuous Infusions:  sodium chloride 20 mL/hr at 04/03/21 0700   sodium chloride     sodium chloride     albumin human 12.5 g (04/02/21 1300)    ceFAZolin (ANCEF) IV Stopped (04/03/21 0630)   insulin 1.5 Units/hr (04/03/21 0700)   lactated ringers     lactated ringers     lactated ringers 20 mL/hr at 04/03/21 0700   magnesium sulfate bolus IVPB 2 g (04/03/21 0803)   phenylephrine (NEO-SYNEPHRINE) Adult infusion 5 mcg/min (04/03/21 0700)   potassium chloride 10 mEq (04/03/21 0713)   potassium chloride     PRN Meds:.sodium chloride, albumin human, dextrose, lactated ringers, metoprolol tartrate, morphine injection, ondansetron (ZOFRAN) IV, oxyCODONE, sodium chloride flush, sodium chloride flush, traMADol  General appearance: alert, cooperative, and no distress Neurologic: intact Heart: regular rate and rhythm Lungs: Breath sounds are clear Abdomen: Soft nontender Wound: Covered with a dry dressing  Lab Results: CBC: Recent Labs    04/02/21 1844 04/03/21 0331  WBC 21.8* 17.9*  HGB 13.5 12.1*  HCT 38.5* 35.4*  PLT 209 201   BMET:  Recent Labs    04/02/21 1844 04/03/21 0331  NA 137 135  K 4.2 3.4*  CL 107 107  CO2 24 22  GLUCOSE 121* 132*  BUN 9 11  CREATININE 0.78 0.61  CALCIUM 7.7* 7.1*    CMET: Lab Results  Component Value Date   WBC 17.9 (H) 04/03/2021   HGB 12.1 (L) 04/03/2021   HCT 35.4 (L) 04/03/2021   PLT 201 04/03/2021   GLUCOSE 132 (H) 04/03/2021   CHOL 200 11/06/2020   TRIG 69 11/06/2020   HDL 41 11/06/2020   LDLCALC 145 (H) 11/06/2020   ALT 31 03/29/2021   AST 33 03/29/2021   NA 135 04/03/2021   K 3.4 (L) 04/03/2021   CL 107 04/03/2021   CREATININE 0.61 04/03/2021   BUN 11  04/03/2021   CO2 22 04/03/2021   TSH 1.544 05/12/2017   INR 1.7 (H) 04/02/2021   HGBA1C 6.2 (H) 03/29/2021   MICROALBUR 4.7 (H) 07/10/2020      PT/INR:  Recent Labs    04/02/21 1302  LABPROT 20.1*  INR 1.7*   Radiology: [facility] Chest Port 1 View  Result Date: 04/02/2021 CLINICAL DATA:  Evaluate for pneumothorax. EXAM: PORTABLE CHEST 1 VIEW COMPARISON:  Chest x-ray dated March 29, 2021 FINDINGS: Cardiac and mediastinal contours are within normal limits for AP technique status post median sternotomy. ET tube is approximately 1.0 cm from the carina. Enteric tube tip and side port project over the expected area of the stomach. Two chest drains project over the mediastinum. Bilateral heterogeneous opacities, likely due to atelectasis pulmonary edema. No large pleural effusion or evidence of pneumothorax. IMPRESSION: Status post median sternotomy.  No evidence of pneumothorax. Electronically Signed   By: Kevin Barron M.D.   On: 04/02/2021 13:52     Assessment/Plan: S/P Procedure(s) (LRB): AORTIC VALVE REPLACEMENT (AVR) USING ON-X 23MM PROSTHETIC HEART VALVE (N/A) TRANSESOPHAGEAL ECHOCARDIOGRAM (TEE) (N/A) APPLICATION OF CELL SAVER  CV-postop day 1 aortic valve replacement with a mechanical prosthesis for severe aortic stenosis.  Hemodynamics and cardiac rhythm stable since surgery.  Continue aspirin and statin.  Begin anticoagulation with Coumadin.  Mobilize  -PULM-stable respiratory status since separation from the vent continue working on pulmonary hygiene and wean O2 per protocol  -HEME-mild expected acute blood loss anemia.  Minimal chest tube drainage.  Monitor  -Endo-no history of diabetes, preop A1c was 6.2.  Transition to [facility] at bedtime sliding scale coverage  -Hypokalemia and hypomagnesemia-replacements ordered  -DVT prophylaxis-to begin daily enoxaparin until INR approaching therapeutic.   Kevin Odea, PA-C [phone] 04/03/2021 8:11 AM  Agree with  above Extubated overnight Starting coumadin today Not requiring pacing  Kevin Barron

## 2021-04-04 ENCOUNTER — Other Ambulatory Visit: Payer: Self-pay | Admitting: Physician Assistant

## 2021-04-04 ENCOUNTER — Inpatient Hospital Stay (HOSPITAL_COMMUNITY): Payer: Self-pay

## 2021-04-04 DIAGNOSIS — Q231 Congenital insufficiency of aortic valve: Secondary | ICD-10-CM

## 2021-04-04 LAB — SURGICAL PATHOLOGY

## 2021-04-04 LAB — CBC
HCT: 31.7 % — ABNORMAL LOW (ref 39.0–52.0)
Hemoglobin: 10.8 g/dL — ABNORMAL LOW (ref 13.0–17.0)
MCH: 31.8 pg (ref 26.0–34.0)
MCHC: 34.1 g/dL (ref 30.0–36.0)
MCV: 93.2 fL (ref 80.0–100.0)
Platelets: 193 10*3/uL (ref 150–400)
RBC: 3.4 MIL/uL — ABNORMAL LOW (ref 4.22–5.81)
RDW: 13.5 % (ref 11.5–15.5)
WBC: 16.9 10*3/uL — ABNORMAL HIGH (ref 4.0–10.5)
nRBC: 0 % (ref 0.0–0.2)

## 2021-04-04 LAB — BASIC METABOLIC PANEL
Anion gap: 6 (ref 5–15)
BUN: 16 mg/dL (ref 6–20)
CO2: 28 mmol/L (ref 22–32)
Calcium: 7.8 mg/dL — ABNORMAL LOW (ref 8.9–10.3)
Chloride: 97 mmol/L — ABNORMAL LOW (ref 98–111)
Creatinine, Ser: 0.72 mg/dL (ref 0.61–1.24)
GFR, Estimated: >60 mL/min (ref >60)
Glucose, Bld: 123 mg/dL — ABNORMAL HIGH (ref 70–99)
Potassium: 3.9 mmol/L (ref 3.5–5.1)
Sodium: 131 mmol/L — ABNORMAL LOW (ref 135–145)

## 2021-04-04 LAB — MAGNESIUM: Magnesium: 1.9 mg/dL (ref 1.7–2.4)

## 2021-04-04 LAB — GLUCOSE, CAPILLARY
Glucose-Capillary: 117 mg/dL — ABNORMAL HIGH (ref 70–99)
Glucose-Capillary: 119 mg/dL — ABNORMAL HIGH (ref 70–99)
Glucose-Capillary: 120 mg/dL — ABNORMAL HIGH (ref 70–99)
Glucose-Capillary: 123 mg/dL — ABNORMAL HIGH (ref 70–99)
Glucose-Capillary: 99 mg/dL (ref 70–99)

## 2021-04-04 LAB — PROTIME-INR
INR: 1.8 — ABNORMAL HIGH (ref 0.8–1.2)
Prothrombin Time: 21.2 seconds — ABNORMAL HIGH (ref 11.4–15.2)

## 2021-04-04 MED ORDER — SODIUM CHLORIDE 0.9 % IV SOLN: 250.0000 mL | INTRAVENOUS | Status: DC | PRN

## 2021-04-04 MED ORDER — FUROSEMIDE 10 MG/ML IJ SOLN
40.0000 mg | Freq: Once | INTRAMUSCULAR | Status: AC
  Administered 2021-04-04: 40 mg via INTRAVENOUS
  Filled 2021-04-04: qty 4

## 2021-04-04 MED ORDER — FUROSEMIDE 40 MG PO TABS
40.0000 mg | ORAL_TABLET | Freq: Every day | ORAL | Status: DC
  Administered 2021-04-05 – 2021-04-07 (×3): 40 mg via ORAL
  Filled 2021-04-04 (×3): qty 1

## 2021-04-04 MED ORDER — ~~LOC~~ CARDIAC SURGERY, PATIENT & FAMILY EDUCATION: Freq: Once | Status: DC

## 2021-04-04 MED ORDER — WARFARIN SODIUM 2.5 MG PO TABS
2.5000 mg | ORAL_TABLET | Freq: Every day | ORAL | Status: DC
  Administered 2021-04-04: 2.5 mg via ORAL
  Filled 2021-04-04: qty 1

## 2021-04-04 MED ORDER — SODIUM CHLORIDE 0.9% FLUSH
3.0000 mL | Freq: Two times a day (BID) | INTRAVENOUS | Status: DC
  Administered 2021-04-04 – 2021-04-05 (×3): 3 mL via INTRAVENOUS

## 2021-04-04 MED ORDER — POTASSIUM CHLORIDE CRYS ER 20 MEQ PO TBCR
40.0000 meq | EXTENDED_RELEASE_TABLET | Freq: Every day | ORAL | Status: DC
  Administered 2021-04-04 – 2021-04-07 (×4): 40 meq via ORAL
  Filled 2021-04-04 (×4): qty 2

## 2021-04-04 MED ORDER — SODIUM CHLORIDE 0.9% FLUSH: 3.0000 mL | INTRAVENOUS | Status: DC | PRN

## 2021-04-04 MED ORDER — POTASSIUM CHLORIDE CRYS ER 20 MEQ PO TBCR: 20.0000 meq | EXTENDED_RELEASE_TABLET | ORAL | Status: DC

## 2021-04-04 NOTE — Progress Notes (Signed)
      [address]       [place] [address]             [phone]      2 Days Post-Op  Procedure(s) (LRB): AORTIC VALVE REPLACEMENT (AVR) USING ON-X 23MM PROSTHETIC HEART VALVE (N/A) TRANSESOPHAGEAL ECHOCARDIOGRAM (TEE) (N/A) APPLICATION OF CELL SAVER   Total Length of Stay:  LOS: 2 days    SUBJECTIVE:  Vitals:   04/04/21 1146 04/04/21 1200  BP:  116/71  Pulse:  86  Resp:  10  Temp: 98.3 F (36.8 C)   SpO2:  94%    Intake/Output      09/27 0701 09/28 0700 09/28 0701 09/29 0700   P.O. 220    I.V. (mL/kg) 462.5 (4.8)    Blood     IV Piggyback 406.2    Total Intake(mL/kg) 1088.7 (11.3)    Urine (mL/kg/hr) 370 (0.2) 500 (0.6)   Emesis/NG output     Blood     Chest Tube 260    Total Output 630 500   Net +458.7 -500            sodium chloride 10 mL/hr at 04/04/21 0700   sodium chloride     lactated ringers     lactated ringers 20 mL/hr at 04/04/21 0700    CBC    Component Value Date/Time   WBC 16.9 (H) 04/04/2021 0321   RBC 3.40 (L) 04/04/2021 0321   HGB 10.8 (L) 04/04/2021 0321   HCT 31.7 (L) 04/04/2021 0321   PLT 193 04/04/2021 0321   MCV 93.2 04/04/2021 0321   MCH 31.8 04/04/2021 0321   MCHC 34.1 04/04/2021 0321   RDW 13.5 04/04/2021 0321   LYMPHSABS 2.0 05/12/2017 1036   MONOABS 0.6 05/12/2017 1036   EOSABS 0.2 05/12/2017 1036   BASOSABS 0.0 05/12/2017 1036   CMP     Component Value Date/Time   NA 131 (L) 04/04/2021 0321   K 3.9 04/04/2021 0321   CL 97 (L) 04/04/2021 0321   CO2 28 04/04/2021 0321   GLUCOSE 123 (H) 04/04/2021 0321   BUN 16 04/04/2021 0321   CREATININE 0.72 04/04/2021 0321   CALCIUM 7.8 (L) 04/04/2021 0321   PROT 6.9 03/29/2021 1500   ALBUMIN 3.8 03/29/2021 1500   AST 33 03/29/2021 1500   ALT 31 03/29/2021 1500   ALKPHOS 49 03/29/2021 1500   BILITOT 0.9 03/29/2021 1500   GFRNONAA >60 04/04/2021 0321   GFRAA >60 03/27/2020 1007   ABG    Component Value Date/Time   PHART 7.345 (L) 04/02/2021  1808   PCO2ART 41.9 04/02/2021 1808   PO2ART 134 (H) 04/02/2021 1808   HCO3 22.6 04/02/2021 1808   TCO2 24 04/02/2021 1808   ACIDBASEDEF 3.0 (H) 04/02/2021 1808   O2SAT 99.0 04/02/2021 1808   CBG (last 3)  Recent Labs    04/03/21 1955 04/03/21 2353 04/04/21 1145  GLUCAP 153* 123* 119*     ASSESSMENT:  Stable day, walked with cardiac rehab  Maintaining NSR, CTs and Wires out today  2.5 mg coumadin tonight for mechanical valve  Awaiting bed on 4E,    Kevin Altizer, PA-C 04/04/21

## 2021-04-04 NOTE — Progress Notes (Signed)
CARDIAC REHAB PHASE I   PRE:  Rate/Rhythm: 90 SR    BP: sitting 124/74    SaO2: 96 2L, 93 RA  MODE:  Ambulation: 370 ft   POST:  Rate/Rhythm: 105 ST    BP: sitting 127/71     SaO2: 90 RA, 88 1L in bed  Pt moved well with verbal cues, min assist. Slow and steady with RW. Walked on RA but had to put back on in bed after walk. Encouraged IS, wife present and supportive.  [place], ACSM 04/04/2021 2:14 PM

## 2021-04-04 NOTE — Progress Notes (Addendum)
TCTS DAILY ICU PROGRESS NOTE                   [address]            [place] [address]          [phone]   2 Days Post-Op Procedure(s) (LRB): AORTIC VALVE REPLACEMENT (AVR) USING ON-X 23MM PROSTHETIC HEART VALVE (N/A) TRANSESOPHAGEAL ECHOCARDIOGRAM (TEE) (N/A) APPLICATION OF CELL SAVER  Total Length of Stay:  LOS: 2 days   Subjective:  Doing well, no new complaints.  He has already ambulated in the unit  Objective: Vital signs in last 24 hours: Temp:  [98.2 F (36.8 C)-99.1 F (37.3 C)] 98.2 F (36.8 C) (09/28 0800) Pulse Rate:  [78-96] 89 (09/28 0800) Cardiac Rhythm: Normal sinus rhythm (09/28 0800) Resp:  [11-25] 14 (09/28 0800) BP: (100-138)/(55-76) 124/76 (09/28 0800) SpO2:  [90 %-95 %] 94 % (09/28 0800) Arterial Line BP: (113)/(52) 113/52 (09/27 0900) Weight:  [96.4 kg] 96.4 kg (09/28 0500)  Filed Weights   04/02/21 0552 04/03/21 0600 04/04/21 0500  Weight: 94.8 kg 96.9 kg 96.4 kg    Weight change: -0.5 kg   Hemodynamic parameters for last 24 hours: CVP:  [3 mmHg] 3 mmHg  Intake/Output from previous day: 09/27 0701 - 09/28 0700 In: 1088.7 [P.O.:220; I.V.:462.5; IV Piggyback:406.2] Out: 630 [Urine:370; Chest Tube:260]  Current Meds: Scheduled Meds:  acetaminophen  1,000 mg Oral Q6H   Or   acetaminophen (TYLENOL) oral liquid 160 mg/5 mL  1,000 mg Per Tube Q6H   aspirin EC  325 mg Oral Daily   Or   aspirin  324 mg Per Tube Daily   atorvastatin  40 mg Oral Q supper   bisacodyl  10 mg Oral Daily   Or   bisacodyl  10 mg Rectal Daily   Chlorhexidine Gluconate Cloth  6 each Topical Daily   [facility], Patient & Family Education   Does not apply Once   docusate sodium  200 mg Oral Daily   enoxaparin (LOVENOX) injection  40 mg Subcutaneous QHS   [START ON 04/05/2021] furosemide  40 mg Oral Daily   insulin aspart  0-24 Units Subcutaneous Q4H   lidocaine  2 patch Transdermal Q24H   mouth rinse  15 mL Mouth Rinse BID    methocarbamol  500 mg Oral TID   pantoprazole  40 mg Oral Daily   potassium chloride  40 mEq Oral Daily   sodium chloride flush  10-40 mL Intracatheter Q12H   sodium chloride flush  3 mL Intravenous Q12H   sodium chloride flush  3 mL Intravenous Q12H   warfarin  2.5 mg Oral q1600   Warfarin - Physician Dosing Inpatient   Does not apply q1600   Continuous Infusions:  sodium chloride 10 mL/hr at 04/04/21 0700   sodium chloride     sodium chloride     sodium chloride     lactated ringers     lactated ringers     lactated ringers 20 mL/hr at 04/04/21 0700   PRN Meds:.sodium chloride, sodium chloride, dextrose, lactated ringers, metoprolol tartrate, morphine injection, ondansetron (ZOFRAN) IV, oxyCODONE, sodium chloride flush, sodium chloride flush, sodium chloride flush, traMADol  General appearance: alert, cooperative, and no distress Heart: irregularly irregular rhythm Lungs: clear to auscultation bilaterally Abdomen: soft, non-tender; bowel sounds normal; no masses,  no organomegaly Extremities: edema trac Wound: clean and dry  Lab Results: CBC: Recent Labs    04/03/21 1740  04/04/21 0321  WBC 18.6* 16.9*  HGB 11.8* 10.8*  HCT 34.8* 31.7*  PLT 205 193   BMET:  Recent Labs    04/03/21 1740 04/04/21 0321  NA 131* 131*  K 4.3 3.9  CL 99 97*  CO2 25 28  GLUCOSE 133* 123*  BUN 15 16  CREATININE 0.68 0.72  CALCIUM 7.8* 7.8*    CMET: Lab Results  Component Value Date   WBC 16.9 (H) 04/04/2021   HGB 10.8 (L) 04/04/2021   HCT 31.7 (L) 04/04/2021   PLT 193 04/04/2021   GLUCOSE 123 (H) 04/04/2021   CHOL 200 11/06/2020   TRIG 69 11/06/2020   HDL 41 11/06/2020   LDLCALC 145 (H) 11/06/2020   ALT 31 03/29/2021   AST 33 03/29/2021   NA 131 (L) 04/04/2021   K 3.9 04/04/2021   CL 97 (L) 04/04/2021   CREATININE 0.72 04/04/2021   BUN 16 04/04/2021   CO2 28 04/04/2021   TSH 1.544 05/12/2017   INR 1.8 (H) 04/04/2021   HGBA1C 6.2 (H) 03/29/2021   MICROALBUR 4.7 (H)  07/10/2020      PT/INR:  Recent Labs    04/04/21 0321  LABPROT 21.2*  INR 1.8*   Radiology: [facility] Chest Port 1 View  Result Date: 04/04/2021 CLINICAL DATA:  Status post cardiac surgery, follow-up film EXAM: PORTABLE CHEST 1 VIEW COMPARISON:  Previous day FINDINGS: There remains a right-sided approach central venous catheter with tip in the SVC. There appears to be 2 mediastinal/chest tube in similar position. Stable appearance of the cardiomediastinal silhouette. No large pleural effusion. No pneumothorax appreciated. Similar bilateral subsegmental discoid atelectasis. Median sternotomy wires. IMPRESSION: Support lines and tubes as described. Stable appearance of the chest with similar bilateral subsegmental atelectasis. No new focal process. Electronically Signed   By: Albin Felling M.D.   On: 04/04/2021 08:13     Assessment/Plan: S/P Procedure(s) (LRB): AORTIC VALVE REPLACEMENT (AVR) USING ON-X 23MM PROSTHETIC HEART VALVE (N/A) TRANSESOPHAGEAL ECHOCARDIOGRAM (TEE) (N/A) APPLICATION OF CELL SAVER  CV- NSR, BP stable- no currently on BB, can hopefully start prior to discharge, will d/c EPW today INR 1.8, coumadin decreased to 2.5 mg daily Pulm- CT output 260 cc output yesterday, CXR with atelectasis bilaterally, will plan to d/c chest tubes later today vs tomorrow after EPW are removed Renal- creatinine and lytes are WNL, weight is trending down, received IV lasix today Expected post operative blood loss anemia- Hgb 10.8 CBGs controlled, patient is not a diabetic, will d/c SSIP Dispo- patient stable, will remove EPW today, leave chest tubes in place can possibly remove later today vs Am if output remains lowe, continue coumadin for mechanical valve, transfer to 4E  Erin Barrett, PA-C 04/04/2021 8:29 AM  Agree with above. Overall doing well. Will remove wires today. Will remove chest tube. Will decrease Coumadin to 2.5 mg  Lajuana Matte

## 2021-04-04 NOTE — Plan of Care (Signed)

## 2021-04-05 LAB — BASIC METABOLIC PANEL
Anion gap: 6 (ref 5–15)
BUN: 14 mg/dL (ref 6–20)
CO2: 30 mmol/L (ref 22–32)
Calcium: 7.9 mg/dL — ABNORMAL LOW (ref 8.9–10.3)
Chloride: 98 mmol/L (ref 98–111)
Creatinine, Ser: 0.79 mg/dL (ref 0.61–1.24)
GFR, Estimated: >60 mL/min (ref >60)
Glucose, Bld: 112 mg/dL — ABNORMAL HIGH (ref 70–99)
Potassium: 4.5 mmol/L (ref 3.5–5.1)
Sodium: 134 mmol/L — ABNORMAL LOW (ref 135–145)

## 2021-04-05 LAB — CBC
HCT: 28.6 % — ABNORMAL LOW (ref 39.0–52.0)
Hemoglobin: 9.8 g/dL — ABNORMAL LOW (ref 13.0–17.0)
MCH: 31.8 pg (ref 26.0–34.0)
MCHC: 34.3 g/dL (ref 30.0–36.0)
MCV: 92.9 fL (ref 80.0–100.0)
Platelets: 192 10*3/uL (ref 150–400)
RBC: 3.08 MIL/uL — ABNORMAL LOW (ref 4.22–5.81)
RDW: 13.3 % (ref 11.5–15.5)
WBC: 11.7 10*3/uL — ABNORMAL HIGH (ref 4.0–10.5)
nRBC: 0 % (ref 0.0–0.2)

## 2021-04-05 LAB — GLUCOSE, CAPILLARY
Glucose-Capillary: 117 mg/dL — ABNORMAL HIGH (ref 70–99)
Glucose-Capillary: 118 mg/dL — ABNORMAL HIGH (ref 70–99)
Glucose-Capillary: 124 mg/dL — ABNORMAL HIGH (ref 70–99)
Glucose-Capillary: 126 mg/dL — ABNORMAL HIGH (ref 70–99)
Glucose-Capillary: 148 mg/dL — ABNORMAL HIGH (ref 70–99)
Glucose-Capillary: 162 mg/dL — ABNORMAL HIGH (ref 70–99)
Glucose-Capillary: 163 mg/dL — ABNORMAL HIGH (ref 70–99)
Glucose-Capillary: 94 mg/dL (ref 70–99)

## 2021-04-05 LAB — PROTIME-INR
INR: 3.2 — ABNORMAL HIGH (ref 0.8–1.2)
Prothrombin Time: 32.9 seconds — ABNORMAL HIGH (ref 11.4–15.2)

## 2021-04-05 MED ORDER — ASPIRIN EC 81 MG PO TBEC
81.0000 mg | DELAYED_RELEASE_TABLET | Freq: Every day | ORAL | Status: DC
  Administered 2021-04-05 – 2021-04-07 (×3): 81 mg via ORAL
  Filled 2021-04-05 (×4): qty 1

## 2021-04-05 MED ORDER — LACTULOSE 10 GM/15ML PO SOLN
20.0000 g | Freq: Once | ORAL | Status: AC
  Administered 2021-04-05: 20 g via ORAL
  Filled 2021-04-05: qty 30

## 2021-04-05 MED ORDER — METOCLOPRAMIDE HCL 5 MG/ML IJ SOLN
10.0000 mg | Freq: Four times a day (QID) | INTRAMUSCULAR | Status: AC
  Administered 2021-04-05 – 2021-04-06 (×4): 10 mg via INTRAVENOUS
  Filled 2021-04-05 (×4): qty 2

## 2021-04-05 MED ORDER — WARFARIN SODIUM 1 MG PO TABS
1.0000 mg | ORAL_TABLET | Freq: Every day | ORAL | Status: DC
  Administered 2021-04-05: 1 mg via ORAL
  Filled 2021-04-05: qty 1

## 2021-04-05 MED FILL — Sodium Chloride IV Soln 0.9%: INTRAVENOUS | Qty: 3000 | Status: AC

## 2021-04-05 MED FILL — Mannitol IV Soln 20%: INTRAVENOUS | Qty: 500 | Status: AC

## 2021-04-05 MED FILL — Sodium Bicarbonate IV Soln 8.4%: INTRAVENOUS | Qty: 50 | Status: AC

## 2021-04-05 MED FILL — Heparin Sodium (Porcine) Inj 1000 Unit/ML: INTRAMUSCULAR | Qty: 10 | Status: AC

## 2021-04-05 MED FILL — Calcium Chloride Inj 10%: INTRAVENOUS | Qty: 10 | Status: AC

## 2021-04-05 MED FILL — Electrolyte-R (PH 7.4) Solution: INTRAVENOUS | Qty: 3000 | Status: AC

## 2021-04-05 NOTE — Progress Notes (Addendum)
TCTS DAILY ICU PROGRESS NOTE                   [address]            [place] [address]          [phone]   3 Days Post-Op Procedure(s) (LRB): AORTIC VALVE REPLACEMENT (AVR) USING ON-X 23MM PROSTHETIC HEART VALVE (N/A) TRANSESOPHAGEAL ECHOCARDIOGRAM (TEE) (N/A) APPLICATION OF CELL SAVER  Total Length of Stay:  LOS: 3 days   Subjective: Patient has not had a bowel movement yet.   Objective: Vital signs in last 24 hours: Temp:  [98.1 F (36.7 C)-99.2 F (37.3 C)] 99.2 F (37.3 C) (09/29 0340) Pulse Rate:  [80-98] 92 (09/29 0500) Cardiac Rhythm: Normal sinus rhythm (09/29 0000) Resp:  [9-26] 12 (09/29 0500) BP: (96-128)/(49-73) 128/63 (09/29 0500) SpO2:  [90 %-96 %] 92 % (09/29 0500)  Filed Weights   04/02/21 0552 04/03/21 0600 04/04/21 0500  Weight: 94.8 kg 96.9 kg 96.4 kg    Weight change:       Intake/Output from previous day: 09/28 0701 - 09/29 0700 In: 121.6 [I.V.:121.6] Out: 2195 [Urine:2175; Chest Tube:20]  Intake/Output this shift: No intake/output data recorded.  Current Meds: Scheduled Meds:  acetaminophen  1,000 mg Oral Q6H   Or   acetaminophen (TYLENOL) oral liquid 160 mg/5 mL  1,000 mg Per Tube Q6H   aspirin EC  81 mg Oral Daily   atorvastatin  40 mg Oral Q supper   bisacodyl  10 mg Oral Daily   Or   bisacodyl  10 mg Rectal Daily   Chlorhexidine Gluconate Cloth  6 each Topical Daily   [place] [facility], Patient & Family Education   Does not apply Once   docusate sodium  200 mg Oral Daily   furosemide  40 mg Oral Daily   insulin aspart  0-24 Units Subcutaneous Q4H   lactulose  20 g Oral Once   lidocaine  2 patch Transdermal Q24H   mouth rinse  15 mL Mouth Rinse BID   methocarbamol  500 mg Oral TID   pantoprazole  40 mg Oral Daily   potassium chloride  40 mEq Oral Daily   sodium chloride flush  10-40 mL Intracatheter Q12H   sodium chloride flush  3 mL Intravenous Q12H   warfarin  1 mg Oral q1600   Warfarin  - Physician Dosing Inpatient   Does not apply q1600   Continuous Infusions:  sodium chloride Stopped (04/04/21 1103)   sodium chloride     lactated ringers     lactated ringers Stopped (04/04/21 1102)   PRN Meds:.sodium chloride, sodium chloride, dextrose, metoprolol tartrate, morphine injection, ondansetron (ZOFRAN) IV, oxyCODONE, sodium chloride flush, sodium chloride flush, traMADol  General appearance: alert, cooperative, and no distress Neurologic: intact Heart: RRR, shart valve click Lungs: Slightly diminished bibasilar breath sounds Abdomen: Soft, non tender, bowel sounds present Extremities: Mild LE edema Wound: Sternal wound is clean and dry  Lab Results: CBC: Recent Labs    04/04/21 0321 04/05/21 0058  WBC 16.9* 11.7*  HGB 10.8* 9.8*  HCT 31.7* 28.6*  PLT 193 192   BMET:  Recent Labs    04/04/21 0321 04/05/21 0058  NA 131* 134*  K 3.9 4.5  CL 97* 98  CO2 28 30  GLUCOSE 123* 112*  BUN 16 14  CREATININE 0.72 0.79  CALCIUM 7.8* 7.9*    CMET: Lab Results  Component Value Date   WBC  11.7 (H) 04/05/2021   HGB 9.8 (L) 04/05/2021   HCT 28.6 (L) 04/05/2021   PLT 192 04/05/2021   GLUCOSE 112 (H) 04/05/2021   CHOL 200 11/06/2020   TRIG 69 11/06/2020   HDL 41 11/06/2020   LDLCALC 145 (H) 11/06/2020   ALT 31 03/29/2021   AST 33 03/29/2021   NA 134 (L) 04/05/2021   K 4.5 04/05/2021   CL 98 04/05/2021   CREATININE 0.79 04/05/2021   BUN 14 04/05/2021   CO2 30 04/05/2021   TSH 1.544 05/12/2017   INR 3.2 (H) 04/05/2021   HGBA1C 6.2 (H) 03/29/2021   MICROALBUR 4.7 (H) 07/10/2020    PT/INR:  Recent Labs    04/05/21 0058  LABPROT 32.9*  INR 3.2*   Radiology: No results found.   Assessment/Plan: S/P Procedure(s) (LRB): AORTIC VALVE REPLACEMENT (AVR) USING ON-X 23MM PROSTHETIC HEART VALVE (N/A) TRANSESOPHAGEAL ECHOCARDIOGRAM (TEE) (N/A) APPLICATION OF CELL SAVER CV-SR with HR in the 90's. On . INR increased from 1.8 to 3.2. Per Dr. Kipp Brood,  will give 1 mg of Coumadin tonight. Pulmonary-On room air. Will check CXR in am. Encourage incentive spirometer. Per Dr. Kipp Brood, no need for BB at this time. Volume overload-on Lasix 40 mg daily Expected post op blood loss anemia-H and H decreased to 9.8 and 28.6 Borderline DM-CBGs 117/117/124. Pre op HGA1C 6.2. I spoke with patient's wife. Doctor gave patient medicine to take because sugar was high but when he returned for follow up, sugar better and wife states patient told to stop medicine. We will have patient follow up with medical doctor after discharge. 6. LOC for constipation 7. Ambulate tid 8. Transfer to floor  Donielle Liston Alba PA-C 04/05/2021 8:09 AM   Agree with above Doing well Awaiting floor bed  Ranson Belluomini O Ariadne Rissmiller

## 2021-04-05 NOTE — Progress Notes (Signed)
CARDIAC REHAB PHASE I   PRE:  Rate/Rhythm: 96 SR    BP: sitting 120/72    SaO2: 92 1/2L  MODE:  Ambulation: 370 ft   POST:  Rate/Rhythm: 119 ST to BR, 105 ST walking    BP: sitting 133/65     SaO2: 92 RA, 90 RA  Pt able to move out of bed independently with verbal cues. Wife present and supportive. To BR then ambulated with RW. Slow and steady, fatigue with distance. To recliner. Practiced IS, 750 ml. Encouraged another walk this pm.  [place], ACSM 04/05/2021 2:46 PM

## 2021-04-05 NOTE — Progress Notes (Signed)
Pt c/o upper back pain, taking pain meds and requests to wait on ambulation. Got pt hot pack, will f/u later. [place], ACSM 1:26 PM 04/05/2021

## 2021-04-05 NOTE — Care Management (Signed)
04-05-21 1321 Case Manager scheduled a hospital follow up appointment for the patient at the [facility]. Information placed on the AVS. Patient will speak with Lynn Ito post visit to discuss medication assistance. If the patient has any new medications they will need to be sent to the [facility] candidate. Case Manager did speak with the interpreter to make the patient aware of the plan of care. Case Manager will continue to follow for additional transition of care needs.

## 2021-04-05 NOTE — Progress Notes (Addendum)
      [address]       [place] [address]             [phone]      3 Days Post-Op  Procedure(s) (LRB): AORTIC VALVE REPLACEMENT (AVR) USING ON-X 23MM PROSTHETIC HEART VALVE (N/A) TRANSESOPHAGEAL ECHOCARDIOGRAM (TEE) (N/A) APPLICATION OF CELL SAVER   Total Length of Stay:  LOS: 3 days    SUBJECTIVE:  Vitals:   04/05/21 1200 04/05/21 1300  BP: 127/78 120/70  Pulse: 86 90  Resp: 14 20  Temp:    SpO2: 93% 91%    Intake/Output      09/28 0701 09/29 0700 09/29 0701 09/30 0700   P.O.  240   I.V. (mL/kg) 121.6 (1.3)    IV Piggyback     Total Intake(mL/kg) 121.6 (1.3) 240 (2.5)   Urine (mL/kg/hr) 2175 (0.9) 200 (0.2)   Chest Tube 20    Total Output 2195 200   Net -2073.4 +40            sodium chloride Stopped (04/04/21 1103)   sodium chloride     lactated ringers     lactated ringers Stopped (04/04/21 1102)    CBC    Component Value Date/Time   WBC 11.7 (H) 04/05/2021 0058   RBC 3.08 (L) 04/05/2021 0058   HGB 9.8 (L) 04/05/2021 0058   HCT 28.6 (L) 04/05/2021 0058   PLT 192 04/05/2021 0058   MCV 92.9 04/05/2021 0058   MCH 31.8 04/05/2021 0058   MCHC 34.3 04/05/2021 0058   RDW 13.3 04/05/2021 0058   LYMPHSABS 2.0 05/12/2017 1036   MONOABS 0.6 05/12/2017 1036   EOSABS 0.2 05/12/2017 1036   BASOSABS 0.0 05/12/2017 1036   CMP     Component Value Date/Time   NA 134 (L) 04/05/2021 0058   K 4.5 04/05/2021 0058   CL 98 04/05/2021 0058   CO2 30 04/05/2021 0058   GLUCOSE 112 (H) 04/05/2021 0058   BUN 14 04/05/2021 0058   CREATININE 0.79 04/05/2021 0058   CALCIUM 7.9 (L) 04/05/2021 0058   PROT 6.9 03/29/2021 1500   ALBUMIN 3.8 03/29/2021 1500   AST 33 03/29/2021 1500   ALT 31 03/29/2021 1500   ALKPHOS 49 03/29/2021 1500   BILITOT 0.9 03/29/2021 1500   GFRNONAA >60 04/05/2021 0058   GFRAA >60 03/27/2020 1007   ABG    Component Value Date/Time   PHART 7.345 (L) 04/02/2021 1808   PCO2ART 41.9 04/02/2021 1808   PO2ART 134 (H)  04/02/2021 1808   HCO3 22.6 04/02/2021 1808   TCO2 24 04/02/2021 1808   ACIDBASEDEF 3.0 (H) 04/02/2021 1808   O2SAT 99.0 04/02/2021 1808   CBG (last 3)  Recent Labs    04/04/21 2341 04/05/21 0334 04/05/21 0757  GLUCAP 117* 117* 124*    ASSESSMENT: Patient sitting in chair talking on phone, wife at chair side On 2 liters of oxygen via [place]. Will wean as able over next few days. Patient with nausea. Will give a few doses of Reglan Patient has not had a bowel movement yet, despite laxative. If does not move bowels this evening, will try another laxative in am  Nani Skillern, PA-C 04/05/2021 15:37  Agree with above. Hemodynamically stable in sinus rhythm. UO ok

## 2021-04-06 ENCOUNTER — Inpatient Hospital Stay (HOSPITAL_COMMUNITY): Payer: Self-pay

## 2021-04-06 LAB — CBC
HCT: 27 % — ABNORMAL LOW (ref 39.0–52.0)
Hemoglobin: 9.3 g/dL — ABNORMAL LOW (ref 13.0–17.0)
MCH: 31.8 pg (ref 26.0–34.0)
MCHC: 34.4 g/dL (ref 30.0–36.0)
MCV: 92.5 fL (ref 80.0–100.0)
Platelets: 262 10*3/uL (ref 150–400)
RBC: 2.92 MIL/uL — ABNORMAL LOW (ref 4.22–5.81)
RDW: 13.1 % (ref 11.5–15.5)
WBC: 10.2 10*3/uL (ref 4.0–10.5)
nRBC: 0 % (ref 0.0–0.2)

## 2021-04-06 LAB — GLUCOSE, CAPILLARY
Glucose-Capillary: 94 mg/dL (ref 70–99)
Glucose-Capillary: 109 mg/dL — ABNORMAL HIGH (ref 70–99)
Glucose-Capillary: 121 mg/dL — ABNORMAL HIGH (ref 70–99)
Glucose-Capillary: 97 mg/dL (ref 70–99)
Glucose-Capillary: 94 mg/dL (ref 70–99)

## 2021-04-06 LAB — BASIC METABOLIC PANEL
Anion gap: 6 (ref 5–15)
BUN: 12 mg/dL (ref 6–20)
CO2: 31 mmol/L (ref 22–32)
Calcium: 7.9 mg/dL — ABNORMAL LOW (ref 8.9–10.3)
Chloride: 99 mmol/L (ref 98–111)
Creatinine, Ser: 0.9 mg/dL (ref 0.61–1.24)
GFR, Estimated: >60 mL/min (ref >60)
Glucose, Bld: 111 mg/dL — ABNORMAL HIGH (ref 70–99)
Potassium: 4 mmol/L (ref 3.5–5.1)
Sodium: 136 mmol/L (ref 135–145)

## 2021-04-06 LAB — PROTIME-INR
INR: 3.6 — ABNORMAL HIGH (ref 0.8–1.2)
Prothrombin Time: 35.6 seconds — ABNORMAL HIGH (ref 11.4–15.2)

## 2021-04-06 MED ORDER — INSULIN ASPART 100 UNIT/ML IJ SOLN
0.0000 [IU] | Freq: Three times a day (TID) | INTRAMUSCULAR | Status: DC
Start: 2021-04-07 — End: 2021-04-07

## 2021-04-06 NOTE — Progress Notes (Addendum)
TCTS DAILY ICU PROGRESS NOTE                   [address]            [place] [address]          [phone]   4 Days Post-Op Procedure(s) (LRB): AORTIC VALVE REPLACEMENT (AVR) USING ON-X 23MM PROSTHETIC HEART VALVE (N/A) TRANSESOPHAGEAL ECHOCARDIOGRAM (TEE) (N/A) APPLICATION OF CELL SAVER  Total Length of Stay:  LOS: 4 days   Subjective: Some incisional pain  Objective: Vital signs in last 24 hours: Temp:  [98.6 F (37 C)-98.9 F (37.2 C)] 98.6 F (37 C) (09/29 2300) Pulse Rate:  [79-99] 85 (09/30 0600) Cardiac Rhythm: Normal sinus rhythm (09/29 2000) Resp:  [11-36] 17 (09/30 0600) BP: (101-133)/(52-78) 133/54 (09/30 0600) SpO2:  [91 %-96 %] 93 % (09/30 0600) Weight:  [93.3 kg] 93.3 kg (09/30 0500)  Filed Weights   04/03/21 0600 04/04/21 0500 04/06/21 0500  Weight: 96.9 kg 96.4 kg 93.3 kg    Weight change:    Hemodynamic parameters for last 24 hours:    Intake/Output from previous day: 09/29 0701 - 09/30 0700 In: 1123 [P.O.:1120; I.V.:3] Out: 1800 [Urine:1800]  Intake/Output this shift: No intake/output data recorded.  Current Meds: Scheduled Meds:  acetaminophen  1,000 mg Oral Q6H   Or   acetaminophen (TYLENOL) oral liquid 160 mg/5 mL  1,000 mg Per Tube Q6H   aspirin EC  81 mg Oral Daily   atorvastatin  40 mg Oral Q supper   bisacodyl  10 mg Oral Daily   Or   bisacodyl  10 mg Rectal Daily   Chlorhexidine Gluconate Cloth  6 each Topical Daily   [facility], Patient & Family Education   Does not apply Once   docusate sodium  200 mg Oral Daily   furosemide  40 mg Oral Daily   insulin aspart  0-24 Units Subcutaneous Q4H   lidocaine  2 patch Transdermal Q24H   mouth rinse  15 mL Mouth Rinse BID   methocarbamol  500 mg Oral TID   metoCLOPramide (REGLAN) injection  10 mg Intravenous Q6H   pantoprazole  40 mg Oral Daily   potassium chloride  40 mEq Oral Daily   sodium chloride flush  3 mL Intravenous Q12H   Warfarin -  Physician Dosing Inpatient   Does not apply q1600   Continuous Infusions:  sodium chloride Stopped (04/04/21 1103)   sodium chloride     lactated ringers     lactated ringers Stopped (04/04/21 1102)   PRN Meds:.sodium chloride, sodium chloride, dextrose, metoprolol tartrate, morphine injection, ondansetron (ZOFRAN) IV, oxyCODONE, sodium chloride flush, traMADol  General appearance: alert, cooperative, and no distress Heart: regular rate and rhythm and soft murmur Lungs: clear Abdomen: benign Extremities: no edema Wound: incis healing well  Lab Results: CBC: Recent Labs    04/05/21 0058 04/06/21 0046  WBC 11.7* 10.2  HGB 9.8* 9.3*  HCT 28.6* 27.0*  PLT 192 262   BMET:  Recent Labs    04/05/21 0058 04/06/21 0046  NA 134* 136  K 4.5 4.0  CL 98 99  CO2 30 31  GLUCOSE 112* 111*  BUN 14 12  CREATININE 0.79 0.90  CALCIUM 7.9* 7.9*    CMET: Lab Results  Component Value Date   WBC 10.2 04/06/2021   HGB 9.3 (L) 04/06/2021   HCT 27.0 (L) 04/06/2021   PLT 262 04/06/2021   GLUCOSE 111 (H)  04/06/2021   CHOL 200 11/06/2020   TRIG 69 11/06/2020   HDL 41 11/06/2020   LDLCALC 145 (H) 11/06/2020   ALT 31 03/29/2021   AST 33 03/29/2021   NA 136 04/06/2021   K 4.0 04/06/2021   CL 99 04/06/2021   CREATININE 0.90 04/06/2021   BUN 12 04/06/2021   CO2 31 04/06/2021   TSH 1.544 05/12/2017   INR 3.6 (H) 04/06/2021   HGBA1C 6.2 (H) 03/29/2021   MICROALBUR 4.7 (H) 07/10/2020      PT/INR:  Recent Labs    04/06/21 0046  LABPROT 35.6*  INR 3.6*   Radiology: DG Chest 2 View  Result Date: 04/06/2021 CLINICAL DATA:  Pleural effusion EXAM: CHEST - 2 VIEW COMPARISON:  04/04/2021 FINDINGS: Prior median sternotomy. Heart is borderline in size. Linear areas of atelectasis in the left mid and lower lung and right lower lung, unchanged. No pneumothorax or effusion. IMPRESSION: Stable areas of atelectasis bilaterally.  No pneumothorax. Electronically Signed   By: Rolm Baptise M.D.    On: 04/06/2021 07:17     Assessment/Plan: S/P Procedure(s) (LRB): AORTIC VALVE REPLACEMENT (AVR) USING ON-X 23MM PROSTHETIC HEART VALVE (N/A) TRANSESOPHAGEAL ECHOCARDIOGRAM (TEE) (N/A) APPLICATION OF CELL SAVER  1 afeb, VSS 2 sats ok on 1 lter 3 good UOP, weight trending down- normal renal fxn 4 CXR - stable atx 5 expected ABLA is pretty stable 6 leukocytosis resolved 7 INR 3.6- hold today 8 routine pulm toilet/rehab Translation provided per nurse who is fluent in [place] PA-C Pager [phone] 04/06/2021 8:08 AM   Agree with above. Titrating his Coumadin. Dispo planning this weekend.  Adoni Greenough Bary Leriche

## 2021-04-06 NOTE — Progress Notes (Signed)
Discussed with pt and family through interpreter IS, sternal precautions, diet including vit K, exercise, and CRPII. Pt and wife voiced understanding. Will refer to [place] however he will need financial assist. Planned to ambulate however pt needed to use BR. He ambulated to BR without assist or AD. Instructed to walk with family without RW. Doing well.  [place] 3:22 PM 04/06/2021

## 2021-04-07 LAB — PROTIME-INR
INR: 2.8 — ABNORMAL HIGH (ref 0.8–1.2)
Prothrombin Time: 29.9 seconds — ABNORMAL HIGH (ref 11.4–15.2)

## 2021-04-07 LAB — GLUCOSE, CAPILLARY: Glucose-Capillary: 117 mg/dL — ABNORMAL HIGH (ref 70–99)

## 2021-04-07 MED ORDER — WARFARIN SODIUM 2 MG PO TABS: 1.0000 mg | ORAL_TABLET | Freq: Every day | ORAL | 1 refills | Status: DC

## 2021-04-07 MED ORDER — OXYCODONE HCL 5 MG PO TABS: 5.0000 mg | ORAL_TABLET | Freq: Four times a day (QID) | ORAL | 0 refills | Status: DC | PRN

## 2021-04-07 NOTE — Progress Notes (Signed)
Discharge instructions provided to patient and reviewed with video interpretor [phone]. Medications, follow-up  and incision care discussed. All questions answered. IV removed. Patient to be escorted home by his wife.   Gailen Shelter RN

## 2021-04-07 NOTE — Progress Notes (Addendum)
[address]       [place] [address]             [phone]      5 Days Post-Op Procedure(s) (LRB): AORTIC VALVE REPLACEMENT (AVR) USING ON-X 23MM PROSTHETIC HEART VALVE (N/A) TRANSESOPHAGEAL ECHOCARDIOGRAM (TEE) (N/A) APPLICATION OF CELL SAVER Subjective: Feels well , some incisional discomfort  Objective: Vital signs in last 24 hours: Temp:  [98 F (36.7 C)-99.6 F (37.6 C)] 98.7 F (37.1 C) (10/01 0448) Pulse Rate:  [85-95] 91 (10/01 0448) Cardiac Rhythm: Normal sinus rhythm (09/30 1934) Resp:  [15-20] 19 (10/01 0448) BP: (126-146)/(69-91) 139/82 (10/01 0448) SpO2:  [94 %-98 %] 95 % (10/01 0448)  Hemodynamic parameters for last 24 hours:    Intake/Output from previous day: 09/30 0701 - 10/01 0700 In: 480 [P.O.:480] Out: 400 [Urine:400] Intake/Output this shift: No intake/output data recorded.  General appearance: alert, cooperative, and no distress Heart: regular rate and rhythm and no murmur Lungs: clear Abdomen: benign Extremities: no edema Wound: incis healing well  Lab Results: Recent Labs    04/05/21 0058 04/06/21 0046  WBC 11.7* 10.2  HGB 9.8* 9.3*  HCT 28.6* 27.0*  PLT 192 262   BMET:  Recent Labs    04/05/21 0058 04/06/21 0046  NA 134* 136  K 4.5 4.0  CL 98 99  CO2 30 31  GLUCOSE 112* 111*  BUN 14 12  CREATININE 0.79 0.90  CALCIUM 7.9* 7.9*    PT/INR:  Recent Labs    04/07/21 0106  LABPROT 29.9*  INR 2.8*   ABG    Component Value Date/Time   PHART 7.345 (L) 04/02/2021 1808   HCO3 22.6 04/02/2021 1808   TCO2 24 04/02/2021 1808   ACIDBASEDEF 3.0 (H) 04/02/2021 1808   O2SAT 99.0 04/02/2021 1808   CBG (last 3)  Recent Labs    04/06/21 1654 04/06/21 2132 04/07/21 0631  GLUCAP 97 94 117*    Meds Scheduled Meds:  acetaminophen  1,000 mg Oral Q6H   Or   acetaminophen (TYLENOL) oral liquid 160 mg/5 mL  1,000 mg Per Tube Q6H   aspirin EC  81 mg Oral Daily   atorvastatin  40 mg Oral Q supper    bisacodyl  10 mg Oral Daily   Or   bisacodyl  10 mg Rectal Daily   Chlorhexidine Gluconate Cloth  6 each Topical Daily   [facility], Patient & Family Education   Does not apply Once   docusate sodium  200 mg Oral Daily   furosemide  40 mg Oral Daily   insulin aspart  0-24 Units Subcutaneous TID WC & HS   lidocaine  2 patch Transdermal Q24H   mouth rinse  15 mL Mouth Rinse BID   pantoprazole  40 mg Oral Daily   potassium chloride  40 mEq Oral Daily   sodium chloride flush  3 mL Intravenous Q12H   Warfarin - Physician Dosing Inpatient   Does not apply q1600   Continuous Infusions:  sodium chloride Stopped (04/04/21 1103)   sodium chloride     lactated ringers     lactated ringers Stopped (04/04/21 1102)   PRN Meds:.sodium chloride, sodium chloride, dextrose, metoprolol tartrate, morphine injection, ondansetron (ZOFRAN) IV, oxyCODONE, sodium chloride flush, traMADol  Xrays DG Chest 2 View  Result Date: 04/06/2021 CLINICAL DATA:  Pleural effusion EXAM: CHEST - 2 VIEW COMPARISON:  04/04/2021 FINDINGS: Prior median sternotomy. Heart is borderline in size. Linear  areas of atelectasis in the left mid and lower lung and right lower lung, unchanged. No pneumothorax or effusion. IMPRESSION: Stable areas of atelectasis bilaterally.  No pneumothorax. Electronically Signed   By: Rolm Baptise M.D.   On: 04/06/2021 07:17    Assessment/Plan: S/P Procedure(s) (LRB): AORTIC VALVE REPLACEMENT (AVR) USING ON-X 23MM PROSTHETIC HEART VALVE (N/A) TRANSESOPHAGEAL ECHOCARDIOGRAM (TEE) (N/A) APPLICATION OF CELL SAVER  1 tmax 99.6, VSS sB p 120's-140's 2 sats good on RA 3 weight appears below preop 4 INR 2.8, coumadin held yesterday for 3.6- d/w pharmacist , will d/c on 1 mg coumadin and checl INR next week 5 no other new labs 6 stable for d/c - Frederico Hamman RN assisted with translation- speaks fluent spanish) - questions answered- spanish instruction in d/c papers  LOS: 5 days    John Giovanni PA-C Pager [phone] 04/07/2021    Chart reviewed, patient examined, agree with above. Plan home today on Coumadin 1 mg daily with INR check early next week.

## 2021-04-07 NOTE — Progress Notes (Signed)
CARDIAC REHAB PHASE I   PRE:  Rate/Rhythm: 96 SR    BP: sitting 125/77    SaO2: 92 RA  MODE:  Ambulation: 400 ft   POST:  Rate/Rhythm: 106 ST    BP: sitting 145/74     SaO2: 96 RA [id number] Patient ambulated in hallway independently without RW. Slow steady gait noted. Denied complaints. Wife and son present during walk for interpretation. Family denied additional questions regarding discharge education. Possible D/C today. Will discharge from CR as patient is safe to ambulate independently with family.   Kevin Barron Minus Breeding RN, BSN

## 2021-04-07 NOTE — Progress Notes (Signed)
Morning assessment and morning med pass completed using video interpreter number [phone].  Kevin Barron, [name]

## 2021-04-07 NOTE — TOC Transition Note (Signed)
Transition of Care [facility]) - CM/SW Discharge Note   Patient Details  Name: Alixander Rallis MRN: [id number] Date of Birth: Dec 16, 1965  [facility]) CM/SW Contact:  Zenon Mayo, RN Phone Number: 04/07/2021, 8:46 AM   Clinical Narrative:    Patient is for dc today, he has been prescribed oxycodone which the Match Letter does not cover and coumadin which is 4.00 for the generic.  Also Cardiac rehab has scheduled him for cardiac rehab at [facility]     Final next level of care: Home/Self Care Barriers to Discharge: No Barriers Identified   Patient Goals and CMS Choice        Discharge Placement                       Discharge Plan and Services                                     Social Determinants of Health (SDOH) Interventions     Readmission Risk Interventions No flowsheet data found.

## 2021-04-08 NOTE — Progress Notes (Addendum)
Cardiology Office Note:    Date:  04/11/2021   ID:  Kevin Barron, DOB [birth year], MRN [id number]  PCP:  [facility], [facility]  Cardiologist:  Rozann Lesches, MD  Electrophysiologist:  None   Referring MD: Health, Andre Lefort*   Chief Complaint: hospital follow-up for AVR  History of Present Illness:    Kevin Barron is a 55 y.o. male with a history of normal coronaries on cardiac cath in 02/2021, severe aortic stenosis s/p recent mechanical AVR on 04/02/2021 on Coumadin, PVCs, hyperlipidemia, type 2 diabetes mellitus, and obesity who is followed by Dr. Domenic Polite and presents today for hospital follow-up after recent AVR.  Patient has a history of aortic stenosis. Recent Echo on 02/12/2021 showed LVEF of 55-60% with bicuspid aortic valve with severe aortic stenosis. He was seen by Dr. Domenic Polite on 02/19/2021 at which time he reported progressive exertional symptoms with fatigue and near syncope but no chest pain. Right/left cardiac catheterization and he was referred to the Structural Heart Team. [facility] showed normal coronaries with severe aortic stenosis (mean gradient of 55.7 mmHg). Decision was made to proceed with AVR given young age and bicuspid aortic valve. Patient presented to [facility] on 04/02/2021 for mechanical AVR with Dr. Kipp Brood. Post-op course was unremarkable and he was started on Coumadin on post-op day 1. He was discharged on 04/07/2021.  Patient presents today for follow-up. Here with wife. Recovering well since surgery. He has been walking in the morning with no problems. He has some chest soreness around his incisions but no other chest pain. No shortness of breath, orthopnea, PND, lower extremity. He notes occasional heart fluttering which sounds like a premature beat but no prolonged palpitations. No lightheadedness, dizziness, or syncope. He has been taking his Coumadin and is tolerating this well with no abnormal bleeding in urine or  stools.  Past Medical History:  Diagnosis Date   Hyperlipidemia    Obesity    S/P AVR (aortic valve replacement)    s/p mechanical AVR on 04/02/2021   Severe aortic stenosis    Type 2 diabetes mellitus (HCC)     Past Surgical History:  Procedure Laterality Date   AORTIC VALVE REPLACEMENT N/A 04/02/2021   Procedure: AORTIC VALVE REPLACEMENT (AVR) USING ON-X 23MM PROSTHETIC HEART VALVE;  Surgeon: Lajuana Matte, MD;  Location: [place];  Service: Open Heart Surgery;  Laterality: N/A;   CARDIAC CATHETERIZATION     LEG SURGERY Left    RIGHT/LEFT HEART CATH AND CORONARY ANGIOGRAPHY N/A 02/28/2021   Procedure: RIGHT/LEFT HEART CATH AND CORONARY ANGIOGRAPHY;  Surgeon: Burnell Blanks, MD;  Location: [facility];  Service: Cardiovascular;  Laterality: N/A;   TEE WITHOUT CARDIOVERSION N/A 04/02/2021   Procedure: TRANSESOPHAGEAL ECHOCARDIOGRAM (TEE);  Surgeon: Lajuana Matte, MD;  Location: [facility];  Service: Open Heart Surgery;  Laterality: N/A;    Current Medications: Current Meds  Medication Sig   amoxicillin (AMOXIL) 500 MG tablet Take 4 tablets (2000 mg) before any dental procedures.   aspirin EC 81 MG tablet Take 81 mg by mouth daily. Swallow whole.   atorvastatin (LIPITOR) 40 MG tablet Take 1 tablet (40 mg total) by mouth daily. Tome una tableta por boca diaria (Patient taking differently: Take 40 mg by mouth daily with supper. Tome una tableta por boca diaria)   oxyCODONE (OXY IR/ROXICODONE) 5 MG immediate release tablet Take 1 tablet (5 mg total) by mouth every 6 (six) hours as needed for severe pain.   warfarin (COUMADIN) 2 MG  tablet Take 0.5 tablets (1 mg total) by mouth daily. As adjusted by coumadin clinic based on INR blood test     Allergies:   Patient has no known allergies.   Social History   Socioeconomic History   Marital status: Married    Spouse name: Not on file   Number of children: Not on file   Years of education: Not on file   Highest  education level: Not on file  Occupational History   Occupation: Dealer  Tobacco Use   Smoking status: Former    Packs/day: 1.00    Years: 14.00    Pack years: 14.00    Types: Cigarettes    Quit date: 07/08/1993    Years since quitting: 27.7   Smokeless tobacco: Never  Vaping Use   Vaping Use: Never used  Substance and Sexual Activity   Alcohol use: No    Comment: Quit drinking 2007. Pt would drink beer socailly   Drug use: No   Sexual activity: Yes  Other Topics Concern   Not on file  Social History Narrative   ** Merged History Encounter **       Social Determinants of Health   Financial Resource Strain: Not on file  Food Insecurity: Not on file  Transportation Needs: Not on file  Physical Activity: Not on file  Stress: Not on file  Social Connections: Not on file     Family History: The patient's family history includes Asthma in his mother; Diabetes in his father; Hypertension in his father.  ROS:   Please see the history of present illness.     EKGs/Labs/Other Studies Reviewed:    The following studies were reviewed today:  Echocardiogram 02/12/2021: Impressions: 1. Mitral inflow not performed . Left ventricular ejection fraction, by  estimation, is 55 to 60%. The left ventricle has normal function. The left  ventricle has no regional wall motion abnormalities. There is mild left  ventricular hypertrophy. Left  ventricular diastolic parameters are indeterminate.   2. Right ventricular systolic function is normal. The right ventricular  size is normal.   3. The mitral valve is normal in structure. No evidence of mitral valve  regurgitation. No evidence of mitral stenosis.   4. Gradients lower than measured 04/24/20 but AS still severe AVA  calculates much lower as LVOT used on this study 1.6 vs 2.2 on previous  DVI gone from 0.21-> 0.25 AV appears tricuspid but cannot r/o bicupid with  partial fusion of left and right cusps .   The aortic valve is  tricuspid. There is severe calcifcation of the aortic  valve. There is severe thickening of the aortic valve. Aortic valve  regurgitation is mild to moderate. Severe aortic valve stenosis.   5. The inferior vena cava is normal in size with greater than 50%  respiratory variability, suggesting right atrial pressure of 3 mmHg.  _______________  Right/Left Cardiac Catheterization 02/28/2021:   Hemodynamic findings consistent with aortic valve stenosis.   There is severe aortic valve stenosis.   No angiographic evidence of CAD Severe aortic stenosis (mean gradient 55.7 mmHg, peak to peak gradient 66 mmHg, AVA 0.8cm2).  Elevated LV filling pressure   Recommendations: Continue planning for AVR. I think he will need to be considered for surgical AVR given his young age and bicuspid aortic valve.   Diagnostic Dominance: Right   EKG:  EKG ordered today. EKG personally reviewed and demonstrates normal sinus rhythm, rate 89 bpm, with no acute ST/T changes. Left axis deviation.  Normal PR and QRS intervals. QTc 450 ms.  Recent Labs: 03/29/2021: ALT 31 04/04/2021: Magnesium 1.9 04/06/2021: BUN 12; Creatinine, Ser 0.90; Hemoglobin 9.3; Platelets 262; Potassium 4.0; Sodium 136  Recent Lipid Panel    Component Value Date/Time   CHOL 200 11/06/2020 0952   TRIG 69 11/06/2020 0952   HDL 41 11/06/2020 0952   CHOLHDL 4.9 11/06/2020 0952   VLDL 14 11/06/2020 0952   LDLCALC 145 (H) 11/06/2020 0952    Physical Exam:    Vital Signs: BP 138/76   Pulse 89   Ht 5\' 3"  (1.6 m)   Wt 194 lb 9.6 oz (88.3 kg)   SpO2 97%   BMI 34.47 kg/m     Wt Readings from Last 3 Encounters:  04/11/21 194 lb 9.6 oz (88.3 kg)  04/06/21 205 lb 11 oz (93.3 kg)  03/29/21 208 lb (94.3 kg)     General: 55 y.o. male in no acute distress. HEENT: Normocephalic and atraumatic. Sclera clear. EOMs intact. Neck: Supple. No carotid bruits. No JVD. Heart: RRR. Distinct S1 and S2. Mechanical click but no significant murmurs. No  rubs or gallops. Radial pulses 2+ and equal bilaterally. Sternotomy and chest tube incisions healing well. Lungs: No increased work of breathing. Clear to ausculation bilaterally. No wheezes, rhonchi, or rales.  Abdomen: Soft, non-distended, and non-tender to palpation.  MSK: Normal strength and tone for age.  Extremities: No lower extremity edema.    Skin: Warm and dry. Neuro: Alert and oriented x3. No focal deficits. Psych: Normal affect. Responds appropriately.  Assessment:    1. Severe aortic stenosis   2. S/P AVR   3. Hyperlipidemia, unspecified hyperlipidemia type   4. Type 2 diabetes mellitus without complication, without long-term current use of insulin (HCC)     Plan:    Severe Aortic Stenosis s/p Recent Mechanical AVR - S/p mechanical AVR on 04/02/2021.  - Recovering well.  - Continue Coumadin. Followed in our Coumadin Clinic.  - Will need SBE prophylaxis prior to dental procedures. Will provide prescription of Amoxicillin for him to have on hand. - Follow-up with CT Surgery on 04/20/2021 as scheduled. Post-op Echo scheduled for 05/15/2021.  Patient states he had labs checked at PCP's office yesterday. Will see if we can get a copy of these.  Hyperlipidemia - Lipid panel on 03/19/2021: Total Cholesterol 181, Triglycerides 145, HDL 41, LDL 114.  - Recently switched from Zocor to Lipitor 40mg  daily. - Followed by PCP.  Type 2 Diabetes Mellitus - Management per PCP.  **Patient is spanish speaking. In-person interpreter Achille Rich assisted with entire visit.**  Disposition: Follow up in 3 months with Dr. Domenic Polite.    Medication Adjustments/Labs and Tests Ordered: Current medicines are reviewed at length with the patient today.  Concerns regarding medicines are outlined above.  Orders Placed This Encounter  Procedures   EKG 12-Lead   Meds ordered this encounter  Medications   amoxicillin (AMOXIL) 500 MG tablet    Sig: Take 4 tablets (2000 mg) before any  dental procedures.    Dispense:  4 tablet    Refill:  1    Patient Instructions  Medication Instructions:  Take Amoxicillin 60 minutes before any dental work.   *If you need a refill on your cardiac medications before your next appointment, please call your pharmacy*   Follow-Up: At [facility], you and your health needs are our priority.  As part of our continuing mission to provide you with exceptional heart care, we have created designated  Provider Care Teams.  These Care Teams include your primary Cardiologist (physician) and Advanced Practice Providers (APPs -  Physician Assistants and Nurse Practitioners) who all work together to provide you with the care you need, when you need it.  We recommend signing up for the patient portal called "MyChart".  Sign up information is provided on this After Visit Summary.  MyChart is used to connect with patients for Virtual Visits (Telemedicine).  Patients are able to view lab/test results, encounter notes, upcoming appointments, etc.  Non-urgent messages can be sent to your provider as well.   To learn more about what you can do with MyChart, go to [email or web address].    Your next appointment:   3 month(s)  The format for your next appointment:   In Person  Provider:   Rozann Lesches, MD      Signed, Darreld Mclean, PA-C  04/11/2021 4:49 PM    [place]

## 2021-04-09 ENCOUNTER — Other Ambulatory Visit: Payer: Self-pay

## 2021-04-09 ENCOUNTER — Ambulatory Visit (INDEPENDENT_AMBULATORY_CARE_PROVIDER_SITE_OTHER): Payer: Self-pay | Admitting: *Deleted

## 2021-04-09 DIAGNOSIS — Z953 Presence of xenogenic heart valve: Secondary | ICD-10-CM

## 2021-04-09 DIAGNOSIS — Z5181 Encounter for therapeutic drug level monitoring: Secondary | ICD-10-CM

## 2021-04-09 LAB — POCT INR: INR: 1.9 — AB (ref 2.0–3.0)

## 2021-04-09 NOTE — Patient Instructions (Signed)
Increase warfarin to 1/2 tablet daily except 1 tablet on Mondays Recheck in 1 week

## 2021-04-10 ENCOUNTER — Encounter: Payer: Self-pay | Admitting: Student

## 2021-04-10 DIAGNOSIS — E785 Hyperlipidemia, unspecified: Secondary | ICD-10-CM | POA: Insufficient documentation

## 2021-04-10 DIAGNOSIS — E119 Type 2 diabetes mellitus without complications: Secondary | ICD-10-CM | POA: Insufficient documentation

## 2021-04-11 ENCOUNTER — Ambulatory Visit (INDEPENDENT_AMBULATORY_CARE_PROVIDER_SITE_OTHER): Payer: Self-pay | Admitting: Student

## 2021-04-11 ENCOUNTER — Encounter: Payer: Self-pay | Admitting: Student

## 2021-04-11 ENCOUNTER — Other Ambulatory Visit: Payer: Self-pay

## 2021-04-11 VITALS — BP 138/76 | HR 89 | Ht 63.0 in | Wt 194.6 lb

## 2021-04-11 DIAGNOSIS — I350 Nonrheumatic aortic (valve) stenosis: Secondary | ICD-10-CM

## 2021-04-11 DIAGNOSIS — E119 Type 2 diabetes mellitus without complications: Secondary | ICD-10-CM

## 2021-04-11 DIAGNOSIS — Z952 Presence of prosthetic heart valve: Secondary | ICD-10-CM

## 2021-04-11 DIAGNOSIS — E785 Hyperlipidemia, unspecified: Secondary | ICD-10-CM

## 2021-04-11 MED ORDER — AMOXICILLIN 500 MG PO TABS: ORAL_TABLET | ORAL | 1 refills | Status: AC

## 2021-04-11 NOTE — Patient Instructions (Signed)
Medication Instructions:  Take Amoxicillin 60 minutes before any dental work.   *If you need a refill on your cardiac medications before your next appointment, please call your pharmacy*   Follow-Up: At [facility], you and your health needs are our priority.  As part of our continuing mission to provide you with exceptional heart care, we have created designated Provider Care Teams.  These Care Teams include your primary Cardiologist (physician) and Advanced Practice Providers (APPs -  Physician Assistants and Nurse Practitioners) who all work together to provide you with the care you need, when you need it.  We recommend signing up for the patient portal called "MyChart".  Sign up information is provided on this After Visit Summary.  MyChart is used to connect with patients for Virtual Visits (Telemedicine).  Patients are able to view lab/test results, encounter notes, upcoming appointments, etc.  Non-urgent messages can be sent to your provider as well.   To learn more about what you can do with MyChart, go to [email or web address].    Your next appointment:   3 month(s)  The format for your next appointment:   In Person  Provider:   Rozann Lesches, MD

## 2021-04-16 ENCOUNTER — Ambulatory Visit (INDEPENDENT_AMBULATORY_CARE_PROVIDER_SITE_OTHER): Payer: Self-pay | Admitting: *Deleted

## 2021-04-16 DIAGNOSIS — Z5181 Encounter for therapeutic drug level monitoring: Secondary | ICD-10-CM

## 2021-04-16 DIAGNOSIS — Z953 Presence of xenogenic heart valve: Secondary | ICD-10-CM

## 2021-04-16 LAB — POCT INR: INR: 1.4 — AB (ref 2.0–3.0)

## 2021-04-16 NOTE — Patient Instructions (Signed)
Take warfarin 1 tablet tonight then increase dose to 1/2 tablet daily except 1 tablet on Tuesdays, Thursdays and Saturdays Recheck in 1 week

## 2021-04-20 ENCOUNTER — Ambulatory Visit (INDEPENDENT_AMBULATORY_CARE_PROVIDER_SITE_OTHER): Payer: Self-pay | Admitting: Thoracic Surgery (Cardiothoracic Vascular Surgery)

## 2021-04-20 ENCOUNTER — Other Ambulatory Visit: Payer: Self-pay

## 2021-04-20 DIAGNOSIS — Z953 Presence of xenogenic heart valve: Secondary | ICD-10-CM

## 2021-04-20 NOTE — Progress Notes (Signed)
     [address]       [place] [address]             [phone]       Patient: Home Provider: Office Consent for Telemedicine visit obtained.  Today's visit was completed via a real-time telehealth (see specific modality noted below). The patient/authorized person provided oral consent at the time of the visit to engage in a telemedicine encounter with the present provider at [facility]. The patient/authorized person was informed of the potential benefits, limitations, and risks of telemedicine. The patient/authorized person expressed understanding that the laws that protect confidentiality also apply to telemedicine. The patient/authorized person acknowledged understanding that telemedicine does not provide emergency services and that he or she would need to call 911 or proceed to the nearest hospital for help if such a need arose.   Total time spent in the clinical discussion 10 minutes.  Telehealth Modality: Phone visit (audio only)  I had a telephone visit with Mr. Kevin Barron.  He is s/p mAVR.  Overall, he has been doing well.  He complains of some orthostasis.   He has had some low INR's, and Coumadin clinic has increased his dose recently.  He is walking about 30 minutes a day.  His incision is looking good.  He will go to the store to buy blood pressure monitor.  I will see him back in 1 month with a chest x-ray.

## 2021-04-23 ENCOUNTER — Ambulatory Visit (INDEPENDENT_AMBULATORY_CARE_PROVIDER_SITE_OTHER): Payer: Self-pay | Admitting: *Deleted

## 2021-04-23 ENCOUNTER — Other Ambulatory Visit: Payer: Self-pay

## 2021-04-23 DIAGNOSIS — Z954 Presence of other heart-valve replacement: Secondary | ICD-10-CM

## 2021-04-23 DIAGNOSIS — Z953 Presence of xenogenic heart valve: Secondary | ICD-10-CM

## 2021-04-23 DIAGNOSIS — Z5181 Encounter for therapeutic drug level monitoring: Secondary | ICD-10-CM

## 2021-04-23 LAB — POCT INR: INR: 1.6 — AB (ref 2.0–3.0)

## 2021-04-23 NOTE — Patient Instructions (Signed)
Increase warfarin to 1 tablet daily except 1/2 tablet on Wednesdays and Saturdays Recheck in 1 week

## 2021-05-01 ENCOUNTER — Ambulatory Visit (INDEPENDENT_AMBULATORY_CARE_PROVIDER_SITE_OTHER): Payer: Self-pay | Admitting: *Deleted

## 2021-05-01 DIAGNOSIS — Z5181 Encounter for therapeutic drug level monitoring: Secondary | ICD-10-CM

## 2021-05-01 DIAGNOSIS — Z953 Presence of xenogenic heart valve: Secondary | ICD-10-CM

## 2021-05-01 LAB — POCT INR: INR: 1.6 — AB (ref 2.0–3.0)

## 2021-05-01 NOTE — Patient Instructions (Signed)
Description    Take 1.5 tablets of warfarin today and then START taking warfarin 1 tablets daily except for 1/2 a tablet on Wednesdays. Recheck INR in 1 week.

## 2021-05-10 ENCOUNTER — Ambulatory Visit (INDEPENDENT_AMBULATORY_CARE_PROVIDER_SITE_OTHER): Payer: Self-pay | Admitting: *Deleted

## 2021-05-10 DIAGNOSIS — Z5181 Encounter for therapeutic drug level monitoring: Secondary | ICD-10-CM

## 2021-05-10 DIAGNOSIS — Z953 Presence of xenogenic heart valve: Secondary | ICD-10-CM

## 2021-05-10 LAB — POCT INR: INR: 1.8 — AB (ref 2.0–3.0)

## 2021-05-10 NOTE — Patient Instructions (Signed)
Increase warfarin to 1 tablet daily except 1 1/2 tablets on Thursdays Recheck INR in 2 weeks

## 2021-05-15 ENCOUNTER — Other Ambulatory Visit: Payer: Self-pay | Admitting: Thoracic Surgery (Cardiothoracic Vascular Surgery)

## 2021-05-15 ENCOUNTER — Ambulatory Visit (HOSPITAL_COMMUNITY)
Admission: RE | Admit: 2021-05-15 | Discharge: 2021-05-15 | Disposition: A | Discharge status: Home / Self Care | Payer: Self-pay | Source: Ambulatory Visit | Attending: Physician Assistant | Admitting: Physician Assistant

## 2021-05-15 ENCOUNTER — Other Ambulatory Visit: Payer: Self-pay

## 2021-05-15 DIAGNOSIS — Z953 Presence of xenogenic heart valve: Secondary | ICD-10-CM

## 2021-05-15 DIAGNOSIS — Q231 Congenital insufficiency of aortic valve: Secondary | ICD-10-CM | POA: Insufficient documentation

## 2021-05-15 LAB — ECHOCARDIOGRAM COMPLETE
AR max vel: 0.98 cm2
AV Area VTI: 1.06 cm2
AV Area mean vel: 1.04 cm2
AV Mean grad: 7.5 mmHg
AV Peak grad: 14.4 mmHg
Ao pk vel: 1.9 m/s
Area-P 1/2: 2.76 cm2
S' Lateral: 3.2 cm

## 2021-05-15 NOTE — Progress Notes (Signed)
*  PRELIMINARY RESULTS* Echocardiogram 2D Echocardiogram has been performed.  Kevin Barron 05/15/2021, 12:33 PM

## 2021-05-17 ENCOUNTER — Other Ambulatory Visit: Payer: Self-pay

## 2021-05-17 ENCOUNTER — Ambulatory Visit (INDEPENDENT_AMBULATORY_CARE_PROVIDER_SITE_OTHER): Payer: Self-pay | Admitting: Physician Assistant

## 2021-05-17 ENCOUNTER — Ambulatory Visit
Admission: RE | Admit: 2021-05-17 | Discharge: 2021-05-17 | Disposition: A | Discharge status: Home / Self Care | Payer: Self-pay | Source: Ambulatory Visit | Attending: Thoracic Surgery (Cardiothoracic Vascular Surgery) | Admitting: Thoracic Surgery (Cardiothoracic Vascular Surgery)

## 2021-05-17 VITALS — BP 149/78 | HR 76 | Resp 20 | Ht 64.0 in | Wt 204.4 lb

## 2021-05-17 DIAGNOSIS — Z953 Presence of xenogenic heart valve: Secondary | ICD-10-CM

## 2021-05-17 DIAGNOSIS — I350 Nonrheumatic aortic (valve) stenosis: Secondary | ICD-10-CM

## 2021-05-17 NOTE — Progress Notes (Signed)
[address]       [place] [address]             [phone]       HPI:  Mr. Kevin Barron is a 55 year old male with history of aortic stenosis, dyslipidemia, and type 2 diabetes mellitus. Patient returns for routine postoperative follow-up having undergone aortic valve replacement with a 23 mm On-X valve on 04/02/2021 for severe aortic stenosis related to native bicuspid aortic valve. The patient's early postoperative recovery while in the hospital without any significant complication.  He did not develop any heart block or any other arrhythmias.  He was discharged on Coumadin with a goal INR of 2.0-2.5 for 3 months then 1.8 thereafter.  He has been followed closely in the Coumadin clinic. His last INR on 05/10/2021 was 1.8.  Next INR is scheduled for 11/17. Today, he reports he feels well.  He has no complaints or concerns.  He denies pain or shortness of breath. Mr. Kevin Barron is seen today with his wife attendance and with a Spanish interpreter present.  Lab Results  Component Value Date   INR 1.8 (A) 05/10/2021   INR 1.6 (A) 05/01/2021   INR 1.6 (A) 04/23/2021   Lab Results  Component Value Date   INR 1.8 (A) 05/10/2021   INR 1.6 (A) 05/01/2021   INR 1.6 (A) 04/23/2021      Current Outpatient Medications  Medication Sig Dispense Refill   amoxicillin (AMOXIL) 500 MG tablet Take 4 tablets (2000 mg) before any dental procedures. 4 tablet 1   aspirin EC 81 MG tablet Take 81 mg by mouth daily. Swallow whole.     atorvastatin (LIPITOR) 40 MG tablet Take 1 tablet (40 mg total) by mouth daily. Tome una tableta por boca diaria (Patient taking differently: Take 40 mg by mouth daily with supper. Tome una tableta por boca diaria) 30 tablet 3   oxyCODONE (OXY IR/ROXICODONE) 5 MG immediate release tablet Take 1 tablet (5 mg total) by mouth every 6 (six) hours as needed for severe pain. 28 tablet 0   warfarin (COUMADIN) 2 MG tablet Take 0.5 tablets (1 mg  total) by mouth daily. As adjusted by coumadin clinic based on INR blood test 100 tablet 1   No current facility-administered medications for this visit.    Physical Exam: Vital signs:  BP 149/78 Pulse 76 RR 20 SaO2 94%  Heart: Regular rate and rhythm, normal click from mechanical valve. Chest: Breath sounds are clear to auscultation.  Sternotomy incision is well-healed.  Sternum is stable. Extremities: No peripheral edema    Diagnostic Tests:  CLINICAL DATA:  Status post AVR   EXAM: CHEST - 2 VIEW   COMPARISON:  04/06/2021   FINDINGS: Cardiomegaly status post median sternotomy. Both lungs are clear. The visualized skeletal structures are unremarkable.   IMPRESSION: Cardiomegaly without acute abnormality of the lungs.     Electronically Signed   By: Delanna Ahmadi M.D.   On: 05/17/2021 14:43  ECHOCARDIOGRAM REPORT         Patient Name:   Kevin Barron Date of Exam: 05/15/2021  Medical Rec #:  [id number]                Height:       63.0 in  Accession #:    [id number]               Weight:       194.6 lb  Date  of Birth:  [birth year]                BSA:          1.911 m  Patient Age:    5 years                 BP:           147/83 mmHg  Patient Gender: M                        HR:           69 bpm.  Exam Location:  [facility]   Procedure: 2D Echo, Cardiac Doppler and Color Doppler   Indications:    Q23.1 (ICD-10-CM) - Bicuspid aortic valve. S/P Aortic  Valve                  replacement Z95.2     History:        Patient has prior history of Echocardiogram examinations,  most                  recent 02/12/2021. Risk Factors:Diabetes and Dyslipidemia.  S/P                  aortic valve replacement with bioprosthetic valve- 15mm.     Sonographer:    Alvino Chapel RCS  Referring Phys: [id number] [place]     1. Left ventricular ejection fraction, by estimation, is 60 to 65%. The  left ventricle has normal function. The left  ventricle has no regional  wall motion abnormalities. There is mild left ventricular hypertrophy.  Left ventricular diastolic parameters  are indeterminate.   2. Right ventricular systolic function is normal. The right ventricular  size is normal. Tricuspid regurgitation signal is inadequate for assessing  PA pressure.   3. The mitral valve is normal in structure. No evidence of mitral valve  regurgitation. No evidence of mitral stenosis.   4. 61mm On-x mechanical valve is in the AV position. . The aortic valve  has been repaired/replaced. Aortic valve regurgitation is trivial. No  aortic stenosis is present.   5. The inferior vena cava is normal in size with greater than 50%  respiratory variability, suggesting right atrial pressure of 3 mmHg.   FINDINGS   Left Ventricle: Left ventricular ejection fraction, by estimation, is 60  to 65%. The left ventricle has normal function. The left ventricle has no  regional wall motion abnormalities. The left ventricular internal cavity  size was normal in size. There is   mild left ventricular hypertrophy. Left ventricular diastolic parameters  are indeterminate.   Right Ventricle: The right ventricular size is normal. No increase in  right ventricular wall thickness. Right ventricular systolic function is  normal. Tricuspid regurgitation signal is inadequate for assessing PA  pressure.   Left Atrium: Left atrial size was normal in size.   Right Atrium: Right atrial size was normal in size.   Pericardium: There is no evidence of pericardial effusion.   Mitral Valve: The mitral valve is normal in structure. No evidence of  mitral valve regurgitation. No evidence of mitral valve stenosis.   Tricuspid Valve: The tricuspid valve is normal in structure. Tricuspid  valve regurgitation is not demonstrated. No evidence of tricuspid  stenosis.   Aortic Valve: 74mm On-x mechanical valve is in the AV position. The aortic  valve has been  repaired/replaced. Aortic valve regurgitation  is trivial.  No aortic stenosis is present. Aortic valve mean gradient measures 7.5  mmHg. Aortic valve peak gradient  measures 14.4 mmHg. Aortic valve area, by VTI measures 1.06 cm.   Pulmonic Valve: The pulmonic valve was not well visualized. Pulmonic valve  regurgitation is not visualized. No evidence of pulmonic stenosis.   Aorta: The aortic root is normal in size and structure.   Venous: The inferior vena cava is normal in size with greater than 50%  respiratory variability, suggesting right atrial pressure of 3 mmHg.   IAS/Shunts: No atrial level shunt detected by color flow Doppler.      LEFT VENTRICLE  PLAX 2D  LVIDd:         4.80 cm   Diastology  LVIDs:         3.20 cm   LV e' medial:    6.09 cm/s  LV PW:         1.10 cm   LV E/e' medial:  15.3  LV IVS:        1.20 cm   LV e' lateral:   9.36 cm/s  LVOT diam:     1.70 cm   LV E/e' lateral: 9.9  LV SV:         43  LV SV Index:   23  LVOT Area:     2.27 cm      RIGHT VENTRICLE  RV S prime:     7.72 cm/s  TAPSE (M-mode): 1.4 cm   LEFT ATRIUM             Index        RIGHT ATRIUM           Index  LA diam:        4.10 cm 2.15 cm/m   RA Area:     16.90 cm  LA Vol (A2C):   37.0 ml 19.36 ml/m  RA Volume:   43.30 ml  22.65 ml/m  LA Vol (A4C):   34.3 ml 17.95 ml/m  LA Biplane Vol: 38.5 ml 20.14 ml/m   AORTIC VALVE  AV Area (Vmax):    0.98 cm  AV Area (Vmean):   1.04 cm  AV Area (VTI):     1.06 cm  AV Vmax:           190.00 cm/s  AV Vmean:          123.000 cm/s  AV VTI:            0.409 m  AV Peak Grad:      14.4 mmHg  AV Mean Grad:      7.5 mmHg  LVOT Vmax:         82.40 cm/s  LVOT Vmean:        56.300 cm/s  LVOT VTI:          0.191 m  LVOT/AV VTI ratio: 0.47     AORTA  Ao Root diam: 3.20 cm   MITRAL VALVE  MV Area (PHT): 2.76 cm    SHUNTS  MV Decel Time: 275 msec    Systemic VTI:  0.19 m  MV E velocity: 92.90 cm/s  Systemic Diam: 1.70 cm  MV A velocity:  97.80 cm/s  MV E/A ratio:  0.95   Carlyle Dolly MD  Electronically signed by Carlyle Dolly MD  Signature Date/Time: 05/15/2021/1:16:56 PM   Impression / Plan: Mr. Kevin Barron is approximately 7 weeks post aortic valve replacement with mechanical 23 mm On-X valve.  He continues to make satisfactory progress and has appropriate INR for this type of valve.  He has been followed closely by the Coumadin clinic.  Postoperative echo obtained a few days ago demonstrates with no evidence of perivalvular leak.  Right and left ventricular function is normal. He may gradually advance his activity but should continue to observe sternal precautions with no lifting greater than 15 pounds for another 4 weeks.  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  He was given a note to this effect. He has been advised on the importance of antibiotic prophylaxis prior to any dental procedure or needing invasive procedure.  He also understands the importance of careful management of his Coumadin lifelong. Follow-up with the Coumadin clinic and cardiology as scheduled.  Follow-up with CT surgery as needed.  [name], PA-C [facility] [phone]

## 2021-05-17 NOTE — Patient Instructions (Signed)
Continue to observe sternal precautions with no lifting greater than 15 pounds for another 4 weeks.  May return to work on December 5 with no restrictions  Follow-up with the Coumadin clinic and cardiology as scheduled

## 2021-05-18 ENCOUNTER — Encounter: Payer: Self-pay | Admitting: Thoracic Surgery (Cardiothoracic Vascular Surgery)

## 2021-05-24 ENCOUNTER — Ambulatory Visit (INDEPENDENT_AMBULATORY_CARE_PROVIDER_SITE_OTHER): Payer: Self-pay | Admitting: *Deleted

## 2021-05-24 ENCOUNTER — Ambulatory Visit: Payer: Self-pay | Admitting: Student

## 2021-05-24 DIAGNOSIS — Z5181 Encounter for therapeutic drug level monitoring: Secondary | ICD-10-CM

## 2021-05-24 DIAGNOSIS — Z953 Presence of xenogenic heart valve: Secondary | ICD-10-CM

## 2021-05-24 LAB — POCT INR: INR: 1.9 — AB (ref 2.0–3.0)

## 2021-05-24 NOTE — Patient Instructions (Signed)
Increase warfarin to 1 tablet daily except 1 1/2 tablets on Mondays and Thursdays Recheck INR in 2 weeks

## 2021-06-06 ENCOUNTER — Ambulatory Visit (INDEPENDENT_AMBULATORY_CARE_PROVIDER_SITE_OTHER): Payer: Self-pay | Admitting: *Deleted

## 2021-06-06 DIAGNOSIS — Z953 Presence of xenogenic heart valve: Secondary | ICD-10-CM

## 2021-06-06 DIAGNOSIS — Z5181 Encounter for therapeutic drug level monitoring: Secondary | ICD-10-CM

## 2021-06-06 LAB — POCT INR: INR: 2.5 (ref 2.0–3.0)

## 2021-06-06 NOTE — Patient Instructions (Signed)
Continue warfarin 1 tablet daily except 1 1/2 tablets on Mondays and Thursdays Recheck INR in 3 weeks

## 2021-06-27 ENCOUNTER — Ambulatory Visit (INDEPENDENT_AMBULATORY_CARE_PROVIDER_SITE_OTHER): Payer: Self-pay | Admitting: *Deleted

## 2021-06-27 DIAGNOSIS — Z953 Presence of xenogenic heart valve: Secondary | ICD-10-CM

## 2021-06-27 DIAGNOSIS — Z5181 Encounter for therapeutic drug level monitoring: Secondary | ICD-10-CM

## 2021-06-27 LAB — POCT INR: INR: 2.9 (ref 2.0–3.0)

## 2021-06-27 NOTE — Patient Instructions (Signed)
Continue warfarin 1 tablet daily except 1 1/2 tablets on Mondays and Thursdays Add in some greens or salad Recheck INR in 4 weeks

## 2021-07-10 NOTE — Progress Notes (Signed)
Cardiology Office Note    Date:  07/11/2021   ID:  Kevin Barron, DOB 1965/10/01, MRN [id number]  PCP:  [facility]  Cardiologist: Rozann Lesches, MD    Chief Complaint  Patient presents with   Follow-up    3 month visit    History of Present Illness:    Kevin Barron is a 56 y.o. male with past medical history of severe AS (s/p mechanical AVR in 03/2021), normal cors by cath in 02/2021, Type 2 DM and HLD who presents to the office today for 47-month follow-up.  He was last examined by Sande Rives, PA-C on 04/11/2021 for hospital follow-up from his recent mechanical AVR.  He reported overall doing well at that time. Continued follow-up with the anticoagulation clinic was recommended and he was provided with an Rx for SBE prophylaxis. Repeat echocardiogram in 05/2021 showed a preserved EF of 60 to 65% with no regional motion abnormalities. His mechanical aortic valve was functioning normally with trivial AI and no stenosis. He did follow-up with CT Surgery in the interim and was cleared to return to work on 06/11/2021.  An interpreter was used during this encounter Kevin Barron ID: [id number]).   In talking with the patient and his wife today, he reports overall doing well since his last office visit. He has been walking for up to an hour a day and denies any exertional chest pain or worsening dyspnea with this. Does continue to have mild pain along his sternal incision but says this has overall improved. No recent orthopnea, PND or pitting edema. 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  He remains on Coumadin for anticoagulation and denies any issues with bleeding. He does report having labs with his PCP approximately 3 to 4 weeks ago and I will request a copy of these.   Past Medical History:  Diagnosis Date   Hyperlipidemia    Obesity    S/P AVR (aortic valve replacement)    s/p  mechanical AVR on 04/02/2021   Severe aortic stenosis    Type 2 diabetes mellitus (HCC)     Past Surgical History:  Procedure Laterality Date   AORTIC VALVE REPLACEMENT N/A 04/02/2021   Procedure: AORTIC VALVE REPLACEMENT (AVR) USING ON-X 23MM PROSTHETIC HEART VALVE;  Surgeon: Lajuana Matte, MD;  Location: [facility];  Service: Open Heart Surgery;  Laterality: N/A;   CARDIAC CATHETERIZATION     LEG SURGERY Left    RIGHT/LEFT HEART CATH AND CORONARY ANGIOGRAPHY N/A 02/28/2021   Procedure: RIGHT/LEFT HEART CATH AND CORONARY ANGIOGRAPHY;  Surgeon: Burnell Blanks, MD;  Location: [facility];  Service: Cardiovascular;  Laterality: N/A;   TEE WITHOUT CARDIOVERSION N/A 04/02/2021   Procedure: TRANSESOPHAGEAL ECHOCARDIOGRAM (TEE);  Surgeon: Lajuana Matte, MD;  Location: [facility];  Service: Open Heart Surgery;  Laterality: N/A;    Current Medications: Outpatient Medications Prior to Visit  Medication Sig Dispense Refill   amoxicillin (AMOXIL) 500 MG tablet Take 4 tablets (2000 mg) before any dental procedures. 4 tablet 1   aspirin EC 81 MG tablet Take 81 mg by mouth daily. Swallow whole.     atorvastatin (LIPITOR) 40 MG tablet Take 1 tablet (40 mg total) by mouth daily. Tome una tableta por boca diaria (Patient taking differently: Take 40 mg by mouth daily with supper. Tome una tableta por boca diaria) 30 tablet 3   warfarin (COUMADIN) 2 MG tablet Take 0.5 tablets (1  mg total) by mouth daily. As adjusted by coumadin clinic based on INR blood test 100 tablet 1   oxyCODONE (OXY IR/ROXICODONE) 5 MG immediate release tablet Take 1 tablet (5 mg total) by mouth every 6 (six) hours as needed for severe pain. (Patient not taking: Reported on 07/11/2021) 28 tablet 0   No facility-administered medications prior to visit.     Allergies:   Patient has no known allergies.   Social History   Socioeconomic History   Marital status: Married    Spouse name: Not on file   Number of  children: Not on file   Years of education: Not on file   Highest education level: Not on file  Occupational History   Occupation: Dealer  Tobacco Use   Smoking status: Former    Packs/day: 1.00    Years: 14.00    Pack years: 14.00    Types: Cigarettes    Quit date: 07/08/1993    Years since quitting: 28.0   Smokeless tobacco: Never  Vaping Use   Vaping Use: Never used  Substance and Sexual Activity   Alcohol use: No    Comment: Quit drinking 2007. Pt would drink beer socailly   Drug use: No   Sexual activity: Yes  Other Topics Concern   Not on file  Social History Narrative   ** Merged History Encounter **       Social Determinants of Health   Financial Resource Strain: Not on file  Food Insecurity: Not on file  Transportation Needs: Not on file  Physical Activity: Not on file  Stress: Not on file  Social Connections: Not on file     Family History:  The patient's family history includes Asthma in his mother; Diabetes in his father; Hypertension in his father.   Review of Systems:    Please see the history of present illness.     All other systems reviewed and are otherwise negative except as noted above.   Physical Exam:    VS:  BP (!) 112/58    Pulse 62    Wt 211 lb (95.7 kg)    SpO2 96%    BMI 36.22 kg/m    General: Well developed, well nourished,male appearing in no acute distress. Head: Normocephalic, atraumatic. Neck: No carotid bruits. JVD not elevated.  Lungs: Respirations regular and unlabored, without wheezes or rales.  Heart: Regular rate and rhythm. Crisp mechanical valve click with no significant murmur.  Abdomen: Appears non-distended. No obvious abdominal masses. Msk:  Strength and tone appear normal for age. No obvious joint deformities or effusions. Extremities: No clubbing or cyanosis. No pitting edema.  Distal pedal pulses are 2+ bilaterally. Neuro: Alert and oriented X 3. Moves all extremities spontaneously. No focal deficits  noted. Psych:  Responds to questions appropriately with a normal affect. Skin: No rashes or lesions noted  Wt Readings from Last 3 Encounters:  07/11/21 211 lb (95.7 kg)  05/17/21 204 lb 6.4 oz (92.7 kg)  04/11/21 194 lb 9.6 oz (88.3 kg)     Studies/Labs Reviewed:   EKG:  EKG is not ordered today.    Recent Labs: 03/29/2021: ALT 31 04/04/2021: Magnesium 1.9 04/06/2021: BUN 12; Creatinine, Ser 0.90; Hemoglobin 9.3; Platelets 262; Potassium 4.0; Sodium 136   Lipid Panel    Component Value Date/Time   CHOL 200 11/06/2020 0952   TRIG 69 11/06/2020 0952   HDL 41 11/06/2020 0952   CHOLHDL 4.9 11/06/2020 0952   VLDL 14 11/06/2020 0952  Trevose 145 (H) 11/06/2020 9562    Additional studies/ records that were reviewed today include:   R/LHC: 02/2021   Hemodynamic findings consistent with aortic valve stenosis.   There is severe aortic valve stenosis.   No angiographic evidence of CAD Severe aortic stenosis (mean gradient 55.7 mmHg, peak to peak gradient 66 mmHg, AVA 0.8cm2).  Elevated LV filling pressure   Recommendations: Continue planning for AVR. I think he will need to be considered for surgical AVR given his young age and bicuspid aortic valve.   Echocardiogram: 05/2021 IMPRESSIONS     1. Left ventricular ejection fraction, by estimation, is 60 to 65%. The  left ventricle has normal function. The left ventricle has no regional  wall motion abnormalities. There is mild left ventricular hypertrophy.  Left ventricular diastolic parameters  are indeterminate.   2. Right ventricular systolic function is normal. The right ventricular  size is normal. Tricuspid regurgitation signal is inadequate for assessing  PA pressure.   3. The mitral valve is normal in structure. No evidence of mitral valve  regurgitation. No evidence of mitral stenosis.   4. 63mm On-x mechanical valve is in the AV position. . The aortic valve  has been repaired/replaced. Aortic valve regurgitation is  trivial. No  aortic stenosis is present.   5. The inferior vena cava is normal in size with greater than 50%  respiratory variability, suggesting right atrial pressure of 3 mmHg.   Assessment:    1. Severe aortic stenosis   2. S/P AVR   3. Mixed hyperlipidemia   4. Need for immunization against influenza      Plan:   In order of problems listed above:  1. Severe AS and now s/p AVR - He underwent mechanical AVR in 03/2021 and recent echocardiogram in 05/2021 showed his mechanical valve was functioning normally with only trivial AI and no significant stenosis. He remains on Coumadin for anticoagulation and denies any evidence of active bleeding. His INR was at 2.9 on most recent check on 06/27/2021 and his dose was adjusted at that time. Will request a copy of recent labs from his PCP to make sure a CBC and BMET were obtained.  -  We reviewed the importance of SBE prophylaxis and he does have an Rx for Amoxicillin if needed.   2. HLD - Followed by his PCP. He remains on Atorvastatin 40 mg daily and will request a copy of most recent labs as outlined above.   3. Need for Influenza Vaccine - Flu vaccine was administered by nursing staff during today's visit.   Medication Adjustments/Labs and Tests Ordered: Current medicines are reviewed at length with the patient today.  Concerns regarding medicines are outlined above.  Medication changes, Labs and Tests ordered today are listed in the Patient Instructions below. Patient Instructions  Medication Instructions:  Your physician recommends that you continue on your current medications as directed. Please refer to the Current Medication list given to you today.  *If you need a refill on your cardiac medications before your next appointment, please call your pharmacy*   Lab Work: NONE  We will request labs from the health department   If you have labs (blood work) drawn today and your tests are completely normal, you will receive your  results only by: [place] (if you have MyChart) OR A paper copy in the mail If you have any lab test that is abnormal or we need to change your treatment, we will call you to review the  results.   Testing/Procedures: NONE    Follow-Up: At [facility], you and your health needs are our priority.  As part of our continuing mission to provide you with exceptional heart care, we have created designated Provider Care Teams.  These Care Teams include your primary Cardiologist (physician) and Advanced Practice Providers (APPs -  Physician Assistants and Nurse Practitioners) who all work together to provide you with the care you need, when you need it.  We recommend signing up for the patient portal called "MyChart".  Sign up information is provided on this After Visit Summary.  MyChart is used to connect with patients for Virtual Visits (Telemedicine).  Patients are able to view lab/test results, encounter notes, upcoming appointments, etc.  Non-urgent messages can be sent to your provider as well.   To learn more about what you can do with MyChart, go to [email or web address].    Your next appointment:   6 month(s)  The format for your next appointment:   In Person  Provider:   Rozann Lesches, MD    Other Instructions Thank you for choosing [facility]!      Signed, Erma Heritage, PA-C  07/11/2021 4:55 PM    [facility] [address] [place], [place] [address] Phone: [phone] Fax: [phone]

## 2021-07-11 ENCOUNTER — Other Ambulatory Visit: Payer: Self-pay

## 2021-07-11 ENCOUNTER — Encounter: Payer: Self-pay | Admitting: *Deleted

## 2021-07-11 ENCOUNTER — Ambulatory Visit (INDEPENDENT_AMBULATORY_CARE_PROVIDER_SITE_OTHER): Payer: Self-pay | Admitting: Student

## 2021-07-11 ENCOUNTER — Encounter: Payer: Self-pay | Admitting: Student

## 2021-07-11 VITALS — BP 112/58 | HR 62 | Wt 211.0 lb

## 2021-07-11 DIAGNOSIS — Z23 Encounter for immunization: Secondary | ICD-10-CM

## 2021-07-11 DIAGNOSIS — E782 Mixed hyperlipidemia: Secondary | ICD-10-CM

## 2021-07-11 DIAGNOSIS — I350 Nonrheumatic aortic (valve) stenosis: Secondary | ICD-10-CM

## 2021-07-11 DIAGNOSIS — Z952 Presence of prosthetic heart valve: Secondary | ICD-10-CM

## 2021-07-11 NOTE — Patient Instructions (Signed)
Medication Instructions:  Your physician recommends that you continue on your current medications as directed. Please refer to the Current Medication list given to you today.  *If you need a refill on your cardiac medications before your next appointment, please call your pharmacy*   Lab Work: NONE  We will request labs from the health department   If you have labs (blood work) drawn today and your tests are completely normal, you will receive your results only by: [place] (if you have MyChart) OR A paper copy in the mail If you have any lab test that is abnormal or we need to change your treatment, we will call you to review the results.   Testing/Procedures: NONE    Follow-Up: At [facility], you and your health needs are our priority.  As part of our continuing mission to provide you with exceptional heart care, we have created designated Provider Care Teams.  These Care Teams include your primary Cardiologist (physician) and Advanced Practice Providers (APPs -  Physician Assistants and Nurse Practitioners) who all work together to provide you with the care you need, when you need it.  We recommend signing up for the patient portal called "MyChart".  Sign up information is provided on this After Visit Summary.  MyChart is used to connect with patients for Virtual Visits (Telemedicine).  Patients are able to view lab/test results, encounter notes, upcoming appointments, etc.  Non-urgent messages can be sent to your provider as well.   To learn more about what you can do with MyChart, go to [email or web address].    Your next appointment:   6 month(s)  The format for your next appointment:   In Person  Provider:   Rozann Lesches, MD    Other Instructions Thank you for choosing [facility]!

## 2021-07-18 ENCOUNTER — Telehealth: Payer: Self-pay | Admitting: Student

## 2021-07-18 DIAGNOSIS — E785 Hyperlipidemia, unspecified: Secondary | ICD-10-CM

## 2021-07-18 MED ORDER — ATORVASTATIN CALCIUM 80 MG PO TABS: 80.0000 mg | ORAL_TABLET | Freq: Every day | ORAL | 3 refills | Status: DC

## 2021-07-18 NOTE — Telephone Encounter (Signed)
Pt wife notified via language line solutions of medication change and the need to have labs done.

## 2021-07-18 NOTE — Telephone Encounter (Signed)
° ° °  Please let the patient know I received labs from his PCP and his cholesterol remains elevated with total cholesterol at 220 and LDL at 150. Please confirm that he is taking Atorvastatin 40 mg daily. If taking this regularly, would recommend titration to 80 mg daily with repeat FLP and LFT's in 2 months.   Signed, Erma Heritage, PA-C 07/18/2021, 12:14 PM Pager: [phone]

## 2021-07-18 NOTE — Addendum Note (Signed)
Addended by: Levonne Hubert on: 07/18/2021 02:42 PM   Modules accepted: Orders

## 2021-07-25 ENCOUNTER — Ambulatory Visit (INDEPENDENT_AMBULATORY_CARE_PROVIDER_SITE_OTHER): Payer: Self-pay | Admitting: *Deleted

## 2021-07-25 DIAGNOSIS — Z5181 Encounter for therapeutic drug level monitoring: Secondary | ICD-10-CM

## 2021-07-25 DIAGNOSIS — Z953 Presence of xenogenic heart valve: Secondary | ICD-10-CM

## 2021-07-25 LAB — POCT INR: INR: 2.6 (ref 2.0–3.0)

## 2021-07-25 NOTE — Patient Instructions (Signed)
Goal for Onyx valve decreased per D/C summary since being place 3 months ago. Decrease warfarin to 1 tablet daily Continue greens or salad Recheck INR in 4 weeks

## 2021-08-22 ENCOUNTER — Ambulatory Visit (INDEPENDENT_AMBULATORY_CARE_PROVIDER_SITE_OTHER): Payer: Self-pay | Admitting: *Deleted

## 2021-08-22 DIAGNOSIS — Z5181 Encounter for therapeutic drug level monitoring: Secondary | ICD-10-CM

## 2021-08-22 DIAGNOSIS — Z953 Presence of xenogenic heart valve: Secondary | ICD-10-CM

## 2021-08-22 LAB — POCT INR: INR: 2.4 (ref 2.0–3.0)

## 2021-08-22 NOTE — Patient Instructions (Signed)
Goal for Onyx valve decreased per D/C summary since being place 3 months ago. Continue warfarin 1 tablet daily Continue greens or salad Recheck INR in 4 weeks

## 2021-09-19 ENCOUNTER — Ambulatory Visit (INDEPENDENT_AMBULATORY_CARE_PROVIDER_SITE_OTHER): Payer: Self-pay | Admitting: *Deleted

## 2021-09-19 DIAGNOSIS — Z5181 Encounter for therapeutic drug level monitoring: Secondary | ICD-10-CM

## 2021-09-19 DIAGNOSIS — Z953 Presence of xenogenic heart valve: Secondary | ICD-10-CM

## 2021-09-19 LAB — POCT INR: INR: 1.2 — AB (ref 2.0–3.0)

## 2021-09-19 NOTE — Patient Instructions (Signed)
Goal for Onyx valve decreased per D/C summary since being place 3 months ago. ?Take warfarin 2 tablets tonight, take 1 1/2 tablets tomorrow night then resume 1 tablet daily ?Continue greens or salad ?Recheck INR in 2 weeks ?

## 2021-10-03 ENCOUNTER — Ambulatory Visit (INDEPENDENT_AMBULATORY_CARE_PROVIDER_SITE_OTHER): Payer: Self-pay | Admitting: *Deleted

## 2021-10-03 DIAGNOSIS — Z5181 Encounter for therapeutic drug level monitoring: Secondary | ICD-10-CM

## 2021-10-03 DIAGNOSIS — Z953 Presence of xenogenic heart valve: Secondary | ICD-10-CM

## 2021-10-03 LAB — POCT INR: INR: 1.6 — AB (ref 2.0–3.0)

## 2021-10-03 MED ORDER — WARFARIN SODIUM 2 MG PO TABS: ORAL_TABLET | ORAL | 2 refills | Status: DC

## 2021-10-03 NOTE — Patient Instructions (Signed)
Goal for Onyx valve decreased per D/C summary since being place 3 months ago. ?Take warfarin 2 tablets tonight then increase dose to 1 1/2 tablets on Mondays and Thursdays ?Continue greens or salad ?Recheck INR in 2 weeks ?

## 2021-10-22 ENCOUNTER — Ambulatory Visit (INDEPENDENT_AMBULATORY_CARE_PROVIDER_SITE_OTHER): Payer: Self-pay | Admitting: *Deleted

## 2021-10-22 DIAGNOSIS — Z5181 Encounter for therapeutic drug level monitoring: Secondary | ICD-10-CM

## 2021-10-22 DIAGNOSIS — Z953 Presence of xenogenic heart valve: Secondary | ICD-10-CM

## 2021-10-22 LAB — POCT INR: INR: 2.3 (ref 2.0–3.0)

## 2021-10-22 NOTE — Patient Instructions (Signed)
Goal for Onyx valve decreased per D/C summary since being place 3 months ago. ?Continue warfarin 1 tablet daily except 1 1/2 tablets on Mondays and Thursdays ?Continue greens or salad ?Recheck INR in 3 weeks ?

## 2021-11-12 ENCOUNTER — Ambulatory Visit (INDEPENDENT_AMBULATORY_CARE_PROVIDER_SITE_OTHER): Payer: Self-pay | Admitting: *Deleted

## 2021-11-12 DIAGNOSIS — Z953 Presence of xenogenic heart valve: Secondary | ICD-10-CM

## 2021-11-12 DIAGNOSIS — Z5181 Encounter for therapeutic drug level monitoring: Secondary | ICD-10-CM

## 2021-11-12 LAB — POCT INR: INR: 1.7 — AB (ref 2.0–3.0)

## 2021-11-12 NOTE — Patient Instructions (Signed)
Goal for Onyx valve decreased per D/C summary since being place 3 months ago. ?Increase warfarin to 1 tablet daily except 1 1/2 tablets on Mondays, Wednesdays and Fridays ?Continue greens or salad ?Recheck INR in 3 weeks ?

## 2021-12-04 ENCOUNTER — Ambulatory Visit (INDEPENDENT_AMBULATORY_CARE_PROVIDER_SITE_OTHER): Payer: Self-pay | Admitting: *Deleted

## 2021-12-04 DIAGNOSIS — Z953 Presence of xenogenic heart valve: Secondary | ICD-10-CM

## 2021-12-04 DIAGNOSIS — Z5181 Encounter for therapeutic drug level monitoring: Secondary | ICD-10-CM

## 2021-12-04 LAB — POCT INR: INR: 3.2 — AB (ref 2.0–3.0)

## 2021-12-04 NOTE — Patient Instructions (Signed)
Goal for Onyx valve decreased per D/C summary since being place 3 months ago. Hold warfarin tonight then continue 1 tablet daily except 1 1/2 tablets on Mondays, Wednesdays and Fridays Continue greens or salad Recheck INR in 3 weeks

## 2021-12-31 ENCOUNTER — Ambulatory Visit (INDEPENDENT_AMBULATORY_CARE_PROVIDER_SITE_OTHER): Payer: Self-pay | Admitting: *Deleted

## 2021-12-31 DIAGNOSIS — Z953 Presence of xenogenic heart valve: Secondary | ICD-10-CM

## 2021-12-31 DIAGNOSIS — Z5181 Encounter for therapeutic drug level monitoring: Secondary | ICD-10-CM

## 2021-12-31 LAB — POCT INR: INR: 1.7 — AB (ref 2.0–3.0)

## 2022-01-21 ENCOUNTER — Ambulatory Visit (INDEPENDENT_AMBULATORY_CARE_PROVIDER_SITE_OTHER): Payer: Self-pay | Admitting: *Deleted

## 2022-01-21 DIAGNOSIS — Z953 Presence of xenogenic heart valve: Secondary | ICD-10-CM

## 2022-01-21 DIAGNOSIS — Z5181 Encounter for therapeutic drug level monitoring: Secondary | ICD-10-CM

## 2022-01-21 LAB — POCT INR: INR: 1.9 — AB (ref 2.0–3.0)

## 2022-01-21 NOTE — Patient Instructions (Signed)
Goal for Onyx valve decreased per D/C summary since being place 3 months ago. Continue 1 tablet daily except 1 1/2 tablets on Mondays, Wednesdays and Fridays Continue greens or salad Recheck INR in 4 weeks

## 2022-02-18 ENCOUNTER — Ambulatory Visit (INDEPENDENT_AMBULATORY_CARE_PROVIDER_SITE_OTHER): Payer: Self-pay | Admitting: *Deleted

## 2022-02-18 DIAGNOSIS — Z953 Presence of xenogenic heart valve: Secondary | ICD-10-CM

## 2022-02-18 DIAGNOSIS — Z5181 Encounter for therapeutic drug level monitoring: Secondary | ICD-10-CM

## 2022-02-18 LAB — POCT INR: INR: 1.5 — AB (ref 2.0–3.0)

## 2022-02-18 NOTE — Patient Instructions (Signed)
Goal for Onyx valve decreased per D/C summary since being place 3 months ago. Take warfarin 2 tablets today then resume 1 tablet daily except 1 1/2 tablets on Mondays, Wednesdays and Fridays Continue greens or salad Recheck INR in 4 weeks

## 2022-03-18 ENCOUNTER — Ambulatory Visit: Payer: Self-pay | Attending: Cardiology | Admitting: *Deleted

## 2022-03-18 DIAGNOSIS — Z953 Presence of xenogenic heart valve: Secondary | ICD-10-CM

## 2022-03-18 DIAGNOSIS — Z5181 Encounter for therapeutic drug level monitoring: Secondary | ICD-10-CM

## 2022-03-18 LAB — POCT INR: INR: 1.6 — AB (ref 2.0–3.0)

## 2022-03-18 NOTE — Patient Instructions (Signed)
Goal for Onyx valve decreased per D/C summary since being place 3 months ago. Take warfarin 2 tablets today then increase dose to 1 1/2 tablets daily except 1 tablet on Sundays, Tuesdays and Thursdays Continue greens or salad Recheck INR in 3 weeks

## 2022-03-28 ENCOUNTER — Encounter: Payer: Self-pay | Admitting: Student

## 2022-04-08 ENCOUNTER — Ambulatory Visit: Payer: Self-pay | Attending: Cardiology | Admitting: *Deleted

## 2022-04-08 DIAGNOSIS — Z5181 Encounter for therapeutic drug level monitoring: Secondary | ICD-10-CM

## 2022-04-08 DIAGNOSIS — Z953 Presence of xenogenic heart valve: Secondary | ICD-10-CM

## 2022-04-08 LAB — POCT INR: INR: 2.3 (ref 2.0–3.0)

## 2022-04-08 NOTE — Patient Instructions (Signed)
Goal for Onyx valve decreased per D/C summary since being place 3 months ago. Continue warfarin 1 1/2 tablets daily except 1 tablet on Sundays, Tuesdays and Thursdays Continue greens or salad Recheck INR in 3 weeks

## 2022-04-26 IMAGING — DX DG CHEST 2V
2 series · 2 of 2 positions shown · non-contrast
Comparison: 04/04/2021

CLINICAL DATA: Pleural effusion

EXAM:
CHEST - 2 VIEW

[chest pa]
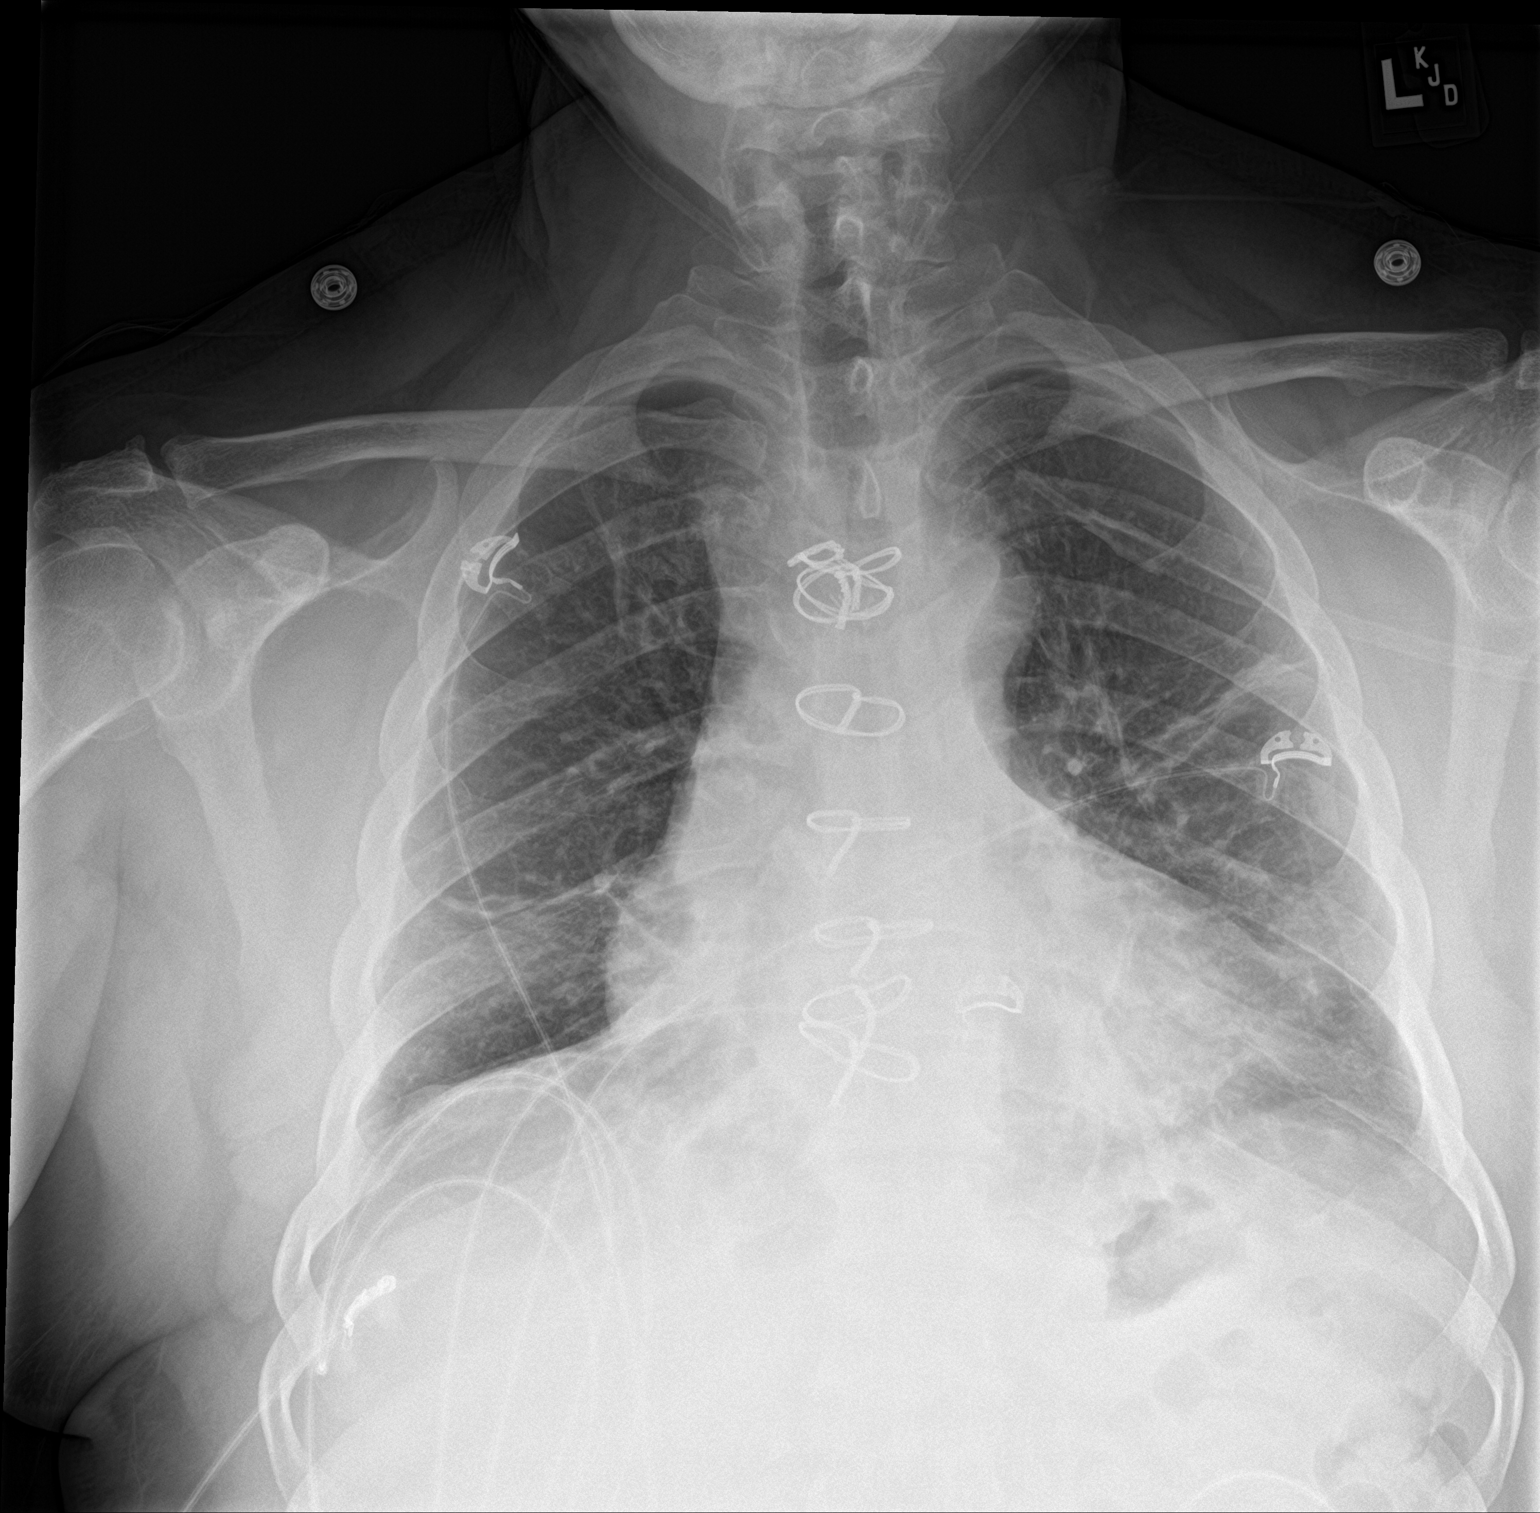

[chest lat]
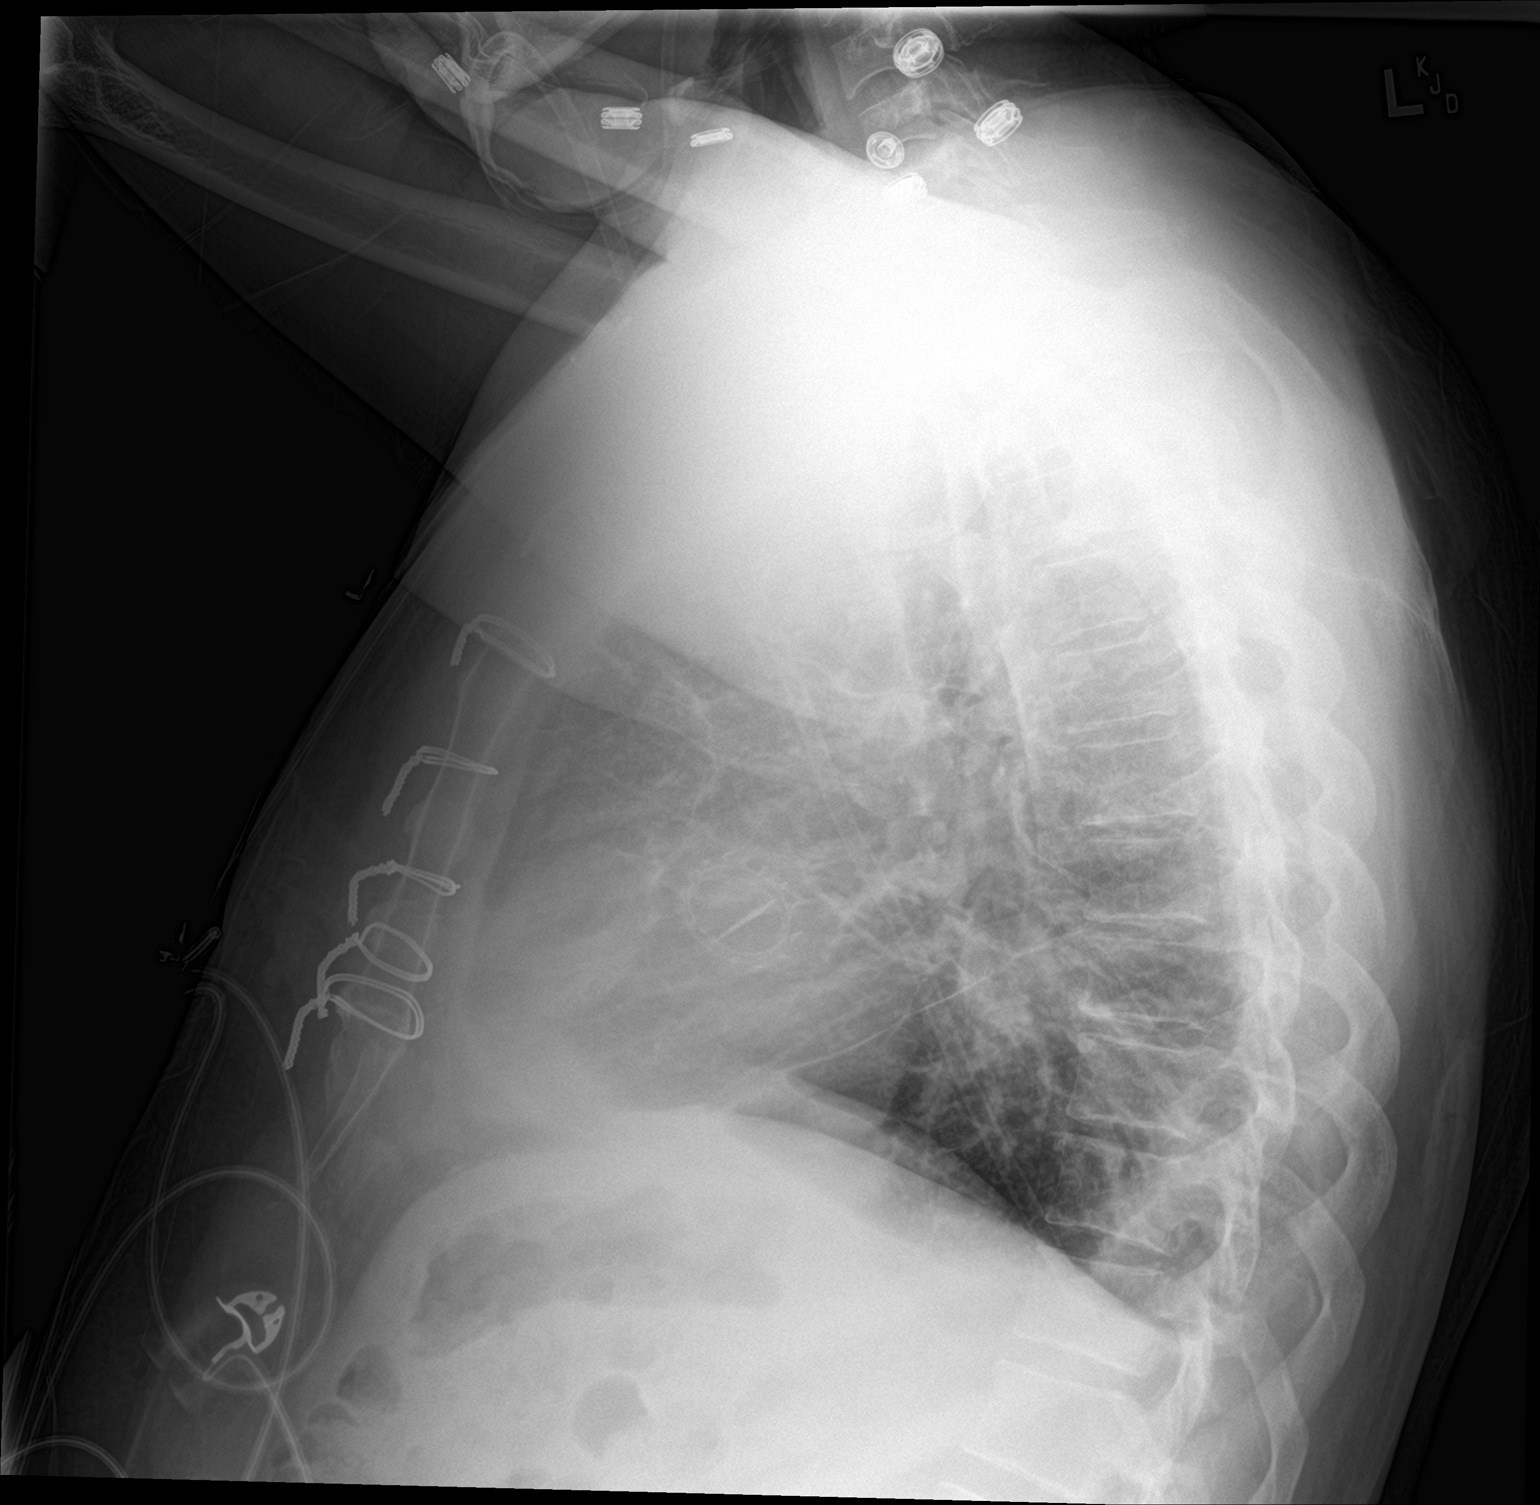

[2 of 2 positions shown; findings below may reference images not displayed]

FINDINGS: Prior median sternotomy. Heart is borderline in size. Linear areas
of atelectasis in the left mid and lower lung and right lower lung,
unchanged. No pneumothorax or effusion.
IMPRESSION: Stable areas of atelectasis bilaterally.  No pneumothorax.

## 2022-04-29 ENCOUNTER — Ambulatory Visit: Payer: Self-pay | Attending: Cardiology | Admitting: *Deleted

## 2022-04-29 DIAGNOSIS — Z953 Presence of xenogenic heart valve: Secondary | ICD-10-CM

## 2022-04-29 DIAGNOSIS — Z5181 Encounter for therapeutic drug level monitoring: Secondary | ICD-10-CM

## 2022-04-29 LAB — POCT INR: INR: 2.4 (ref 2.0–3.0)

## 2022-04-29 NOTE — Patient Instructions (Signed)
Goal for Onyx valve decreased per D/C summary since being place 3 months ago. Continue warfarin 1 1/2 tablets daily except 1 tablet on Sundays, Tuesdays and Thursdays Continue greens or salad Recheck INR in 4 weeks 

## 2022-05-27 ENCOUNTER — Ambulatory Visit: Payer: Self-pay | Attending: Cardiology | Admitting: *Deleted

## 2022-05-27 DIAGNOSIS — Z953 Presence of xenogenic heart valve: Secondary | ICD-10-CM

## 2022-05-27 DIAGNOSIS — Z5181 Encounter for therapeutic drug level monitoring: Secondary | ICD-10-CM

## 2022-05-27 LAB — POCT INR: INR: 1.8 — AB (ref 2.0–3.0)

## 2022-05-27 NOTE — Patient Instructions (Signed)
Goal for Onyx valve decreased per D/C summary since being place 3 months ago. Continue warfarin 1 1/2 tablets daily except 1 tablet on Sundays, Tuesdays and Thursdays Continue greens or salad Recheck INR in 4 weeks 

## 2022-06-06 IMAGING — CR DG CHEST 2V
2 series · 2 of 2 positions shown · non-contrast
Comparison: 04/06/2021

CLINICAL DATA: Status post AVR

EXAM:
CHEST - 2 VIEW

[w chest pa]
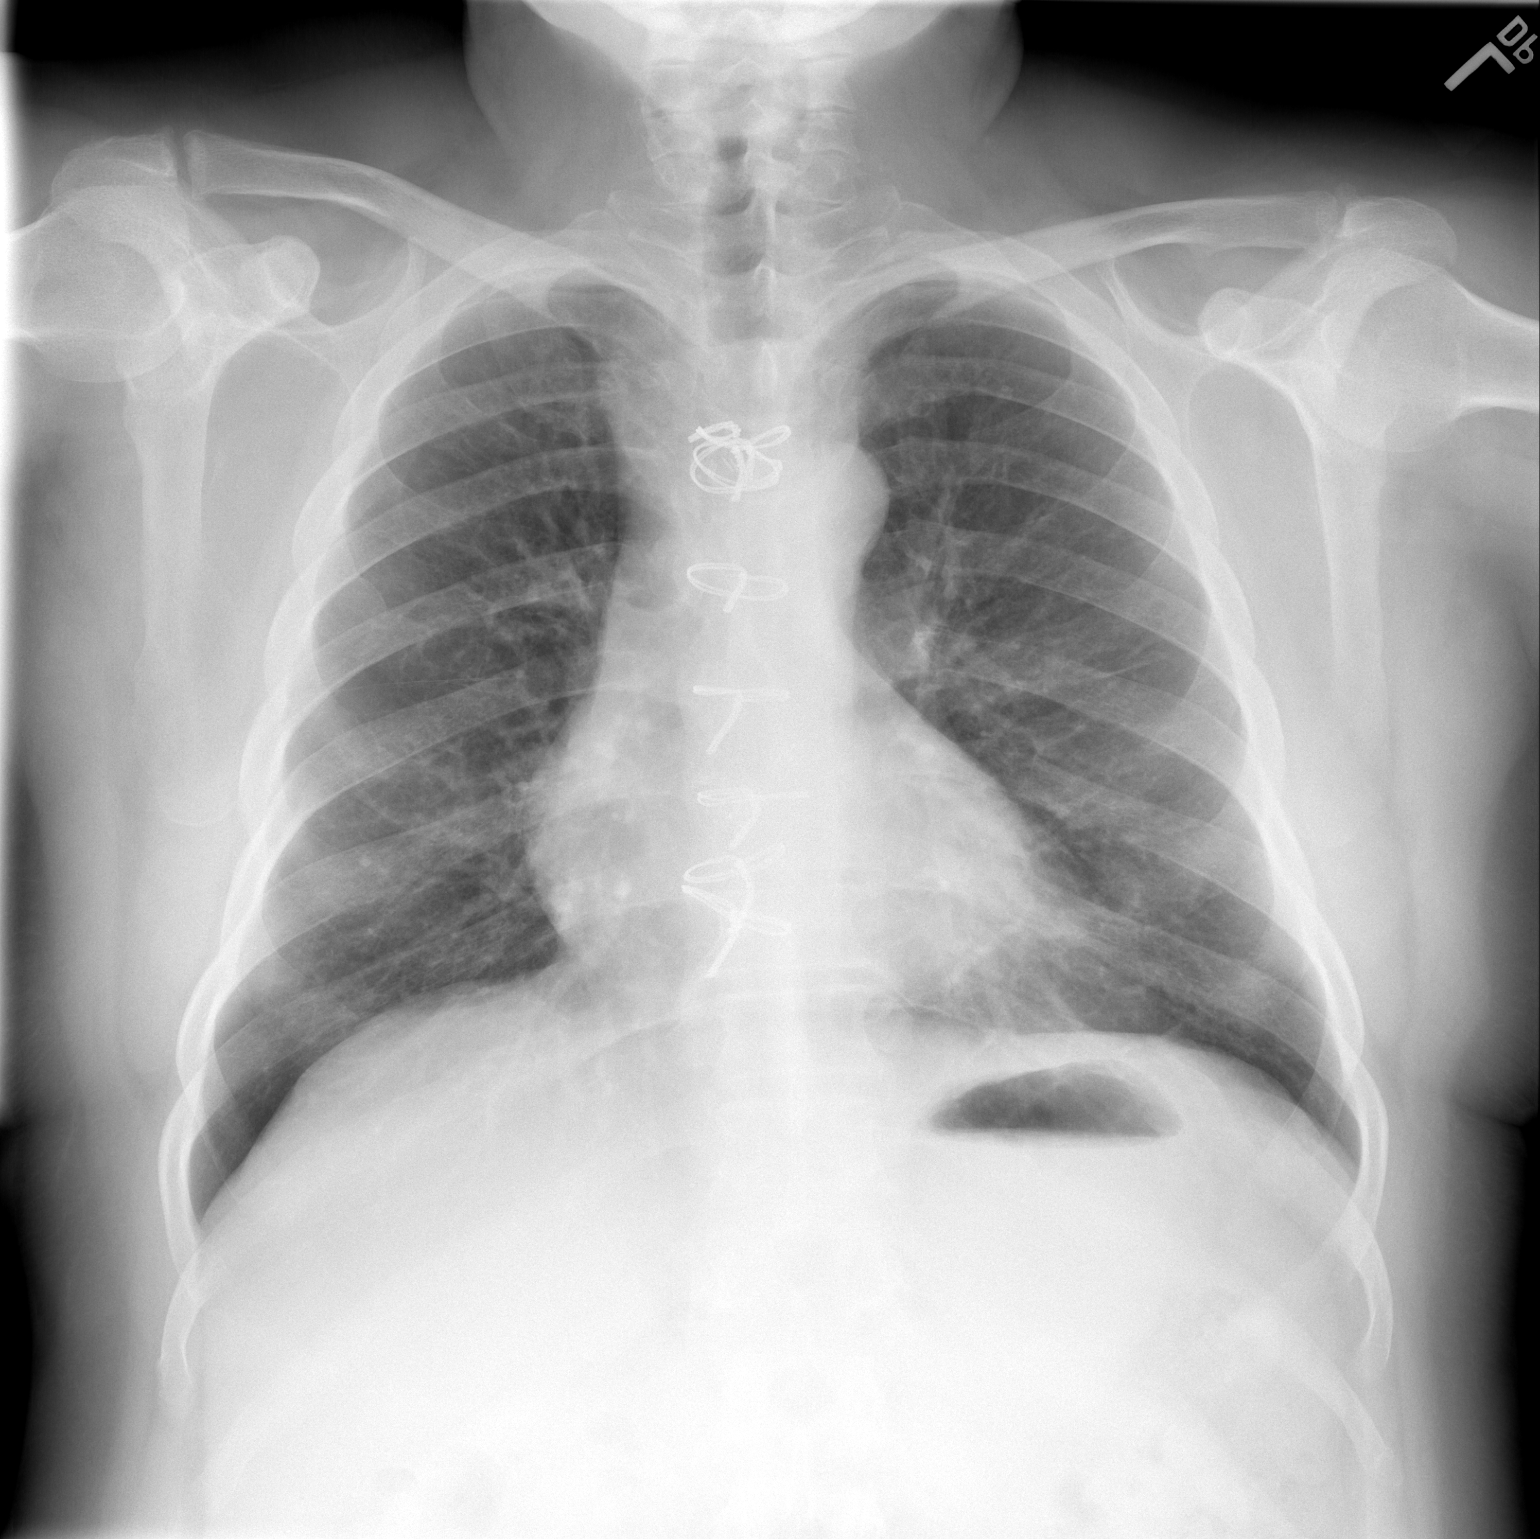

[w chest lat]
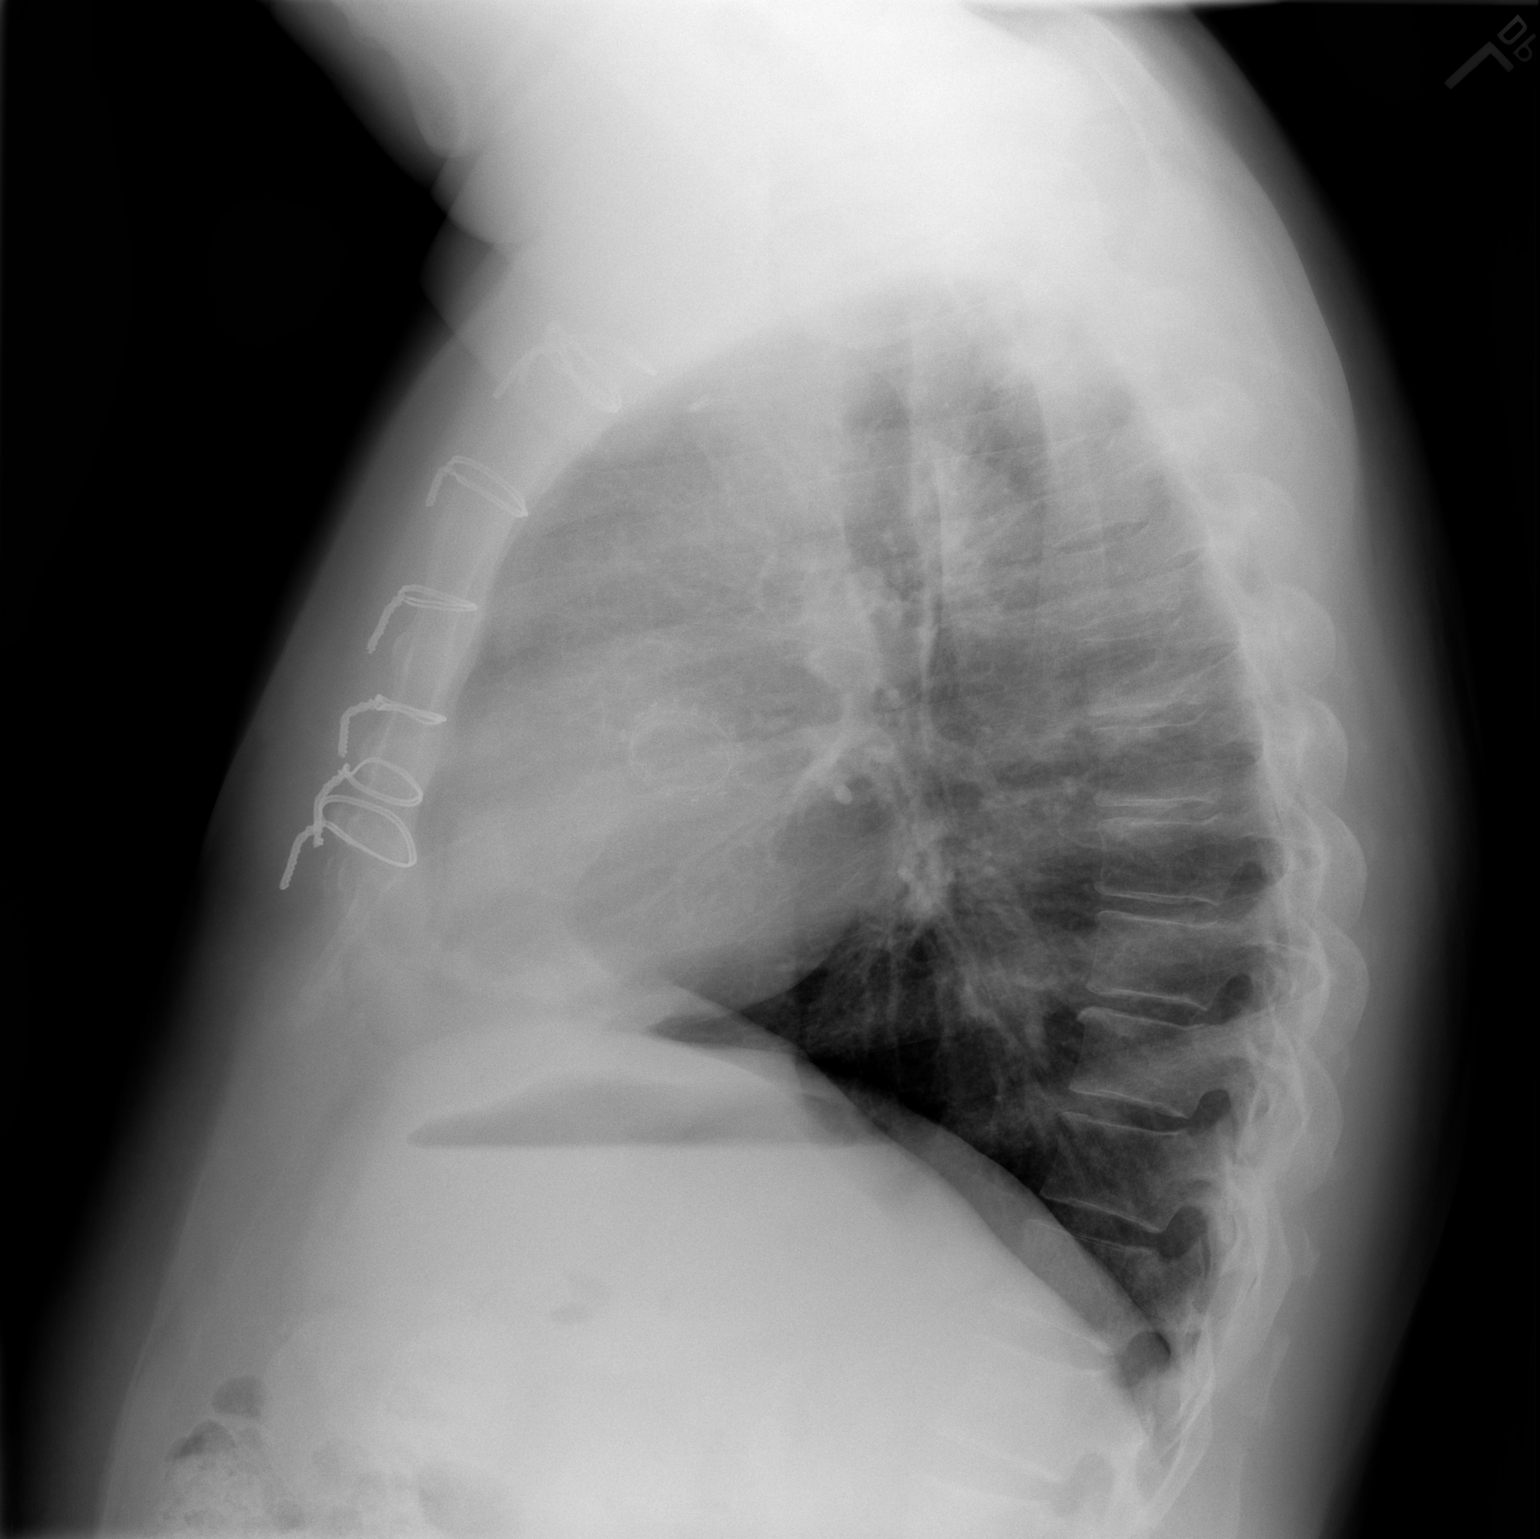

[2 of 2 positions shown; findings below may reference images not displayed]

FINDINGS: Cardiomegaly status post median sternotomy. Both lungs are clear.
The visualized skeletal structures are unremarkable.
IMPRESSION: Cardiomegaly without acute abnormality of the lungs.

## 2022-06-24 ENCOUNTER — Ambulatory Visit: Payer: Self-pay | Attending: Cardiology | Admitting: *Deleted

## 2022-06-24 DIAGNOSIS — Z953 Presence of xenogenic heart valve: Secondary | ICD-10-CM

## 2022-06-24 DIAGNOSIS — Z5181 Encounter for therapeutic drug level monitoring: Secondary | ICD-10-CM

## 2022-06-24 LAB — POCT INR: INR: 2.8 (ref 2.0–3.0)

## 2022-06-24 NOTE — Patient Instructions (Signed)
Goal for Onyx valve decreased per D/C summary since being place 3 months ago. Take warfarin 1 tablet tonight then continue 1 1/2 tablets daily except 1 tablet on Sundays, Tuesdays and Thursdays Continue greens or salad Recheck INR in 4 weeks

## 2022-07-22 ENCOUNTER — Ambulatory Visit: Payer: Self-pay | Attending: Cardiology | Admitting: *Deleted

## 2022-07-22 DIAGNOSIS — Z5181 Encounter for therapeutic drug level monitoring: Secondary | ICD-10-CM

## 2022-07-22 DIAGNOSIS — Z953 Presence of xenogenic heart valve: Secondary | ICD-10-CM

## 2022-07-22 LAB — POCT INR: INR: 2.3 (ref 2.0–3.0)

## 2022-07-22 MED ORDER — WARFARIN SODIUM 2 MG PO TABS: ORAL_TABLET | ORAL | 3 refills | Status: DC

## 2022-07-22 NOTE — Patient Instructions (Signed)
Goal for Onyx valve decreased per D/C summary since being place 3 months ago. Continue warfarin 1 1/2 tablets daily except 1 tablet on Sundays, Tuesdays and Thursdays Continue greens or salad Recheck INR in 4 weeks 

## 2022-08-19 ENCOUNTER — Ambulatory Visit: Payer: Self-pay | Attending: Cardiology | Admitting: *Deleted

## 2022-08-19 DIAGNOSIS — Z5181 Encounter for therapeutic drug level monitoring: Secondary | ICD-10-CM

## 2022-08-19 DIAGNOSIS — Z953 Presence of xenogenic heart valve: Secondary | ICD-10-CM

## 2022-08-19 LAB — POCT INR: INR: 1.5 — AB (ref 2.0–3.0)

## 2022-08-19 NOTE — Patient Instructions (Signed)
Goal for Onyx valve decreased per D/C summary since being place 3 months ago. Take warfarin 2 tablets tonight, 1 1/2 tablets tomorrow night then continue 1 1/2 tablets daily except 1 tablet on Sundays, Tuesdays and Thursdays Continue greens or salad Recheck INR in 3 weeks

## 2022-08-28 ENCOUNTER — Ambulatory Visit: Payer: Self-pay | Admitting: Cardiology

## 2022-09-05 ENCOUNTER — Encounter: Payer: Self-pay | Admitting: Radiology

## 2022-09-09 ENCOUNTER — Ambulatory Visit: Payer: Self-pay | Attending: Cardiology | Admitting: *Deleted

## 2022-09-09 DIAGNOSIS — Z953 Presence of xenogenic heart valve: Secondary | ICD-10-CM

## 2022-09-09 DIAGNOSIS — Z5181 Encounter for therapeutic drug level monitoring: Secondary | ICD-10-CM

## 2022-09-09 LAB — POCT INR: INR: 2.6 (ref 2.0–3.0)

## 2022-09-09 NOTE — Patient Instructions (Signed)
Goal for Onyx valve decreased per D/C summary since being place 3 months ago. Continue warfarin 1 1/2 tablets daily except 1 tablet on Sundays, Tuesdays and Thursdays Continue greens or salad Recheck INR in 4 weeks

## 2022-10-07 ENCOUNTER — Ambulatory Visit: Payer: Self-pay | Attending: Cardiology | Admitting: *Deleted

## 2022-10-07 DIAGNOSIS — Z953 Presence of xenogenic heart valve: Secondary | ICD-10-CM

## 2022-10-07 DIAGNOSIS — Z5181 Encounter for therapeutic drug level monitoring: Secondary | ICD-10-CM

## 2022-10-07 LAB — POCT INR: INR: 1.8 — AB (ref 2.0–3.0)

## 2022-10-07 NOTE — Patient Instructions (Signed)
Goal for Onyx valve decreased per D/C summary since being place 3 months ago. Continue warfarin 1 1/2 tablets daily except 1 tablet on Sundays, Tuesdays and Thursdays Continue greens or salad Recheck INR in 4 weeks 

## 2022-10-31 ENCOUNTER — Encounter: Payer: Self-pay | Admitting: Cardiology

## 2022-10-31 ENCOUNTER — Ambulatory Visit: Payer: Self-pay | Attending: Cardiology | Admitting: Cardiology

## 2022-10-31 VITALS — BP 128/70 | HR 67 | Wt 214.0 lb

## 2022-10-31 DIAGNOSIS — E782 Mixed hyperlipidemia: Secondary | ICD-10-CM

## 2022-10-31 DIAGNOSIS — Z952 Presence of prosthetic heart valve: Secondary | ICD-10-CM

## 2022-10-31 NOTE — Addendum Note (Signed)
Addended by: Marlyn Corporal A on: 10/31/2022 11:45 AM   Modules accepted: Orders

## 2022-10-31 NOTE — Progress Notes (Signed)
    Cardiology Office Note  Date: 10/31/2022   ID: Kevin Barron, DOB 09-Aug-1965, MRN [id number]  History of Present Illness: Kevin Barron is a 57 y.o. male last seen in January 2023 by Ms. Strader PA-C, I reviewed the note.  He is here today with his wife for follow-up visit.  Encounter was facilitated by an official Spanish language interpreter.  He states that he has been doing well, no exertional chest pain or unusual shortness of breath indicated.  He is working in [employer] at this time.  Occasionally feels somewhat lightheaded when he stands up quickly and walks but has had no palpitations or syncope.  He continues to follow with the health department for management of his lipids and remains on Lipitor.  He continues to follow in the anticoagulation clinic on Coumadin.  No reported spontaneous bleeding problems.  We did discuss the critical importance of continued anticoagulation with his mechanical aortic valve in place.  Last echocardiogram was in 2022.  ECG today shows normal sinus rhythm.  Physical Exam: VS:  BP 128/70   Pulse 67   Wt 214 lb (97.1 kg)   SpO2 95%   BMI 36.73 kg/m , BMI Body mass index is 36.73 kg/m.  Wt Readings from Last 3 Encounters:  10/31/22 214 lb (97.1 kg)  07/11/21 211 lb (95.7 kg)  05/17/21 204 lb 6.4 oz (92.7 kg)    General: Patient appears comfortable at rest. HEENT: Conjunctiva and lids normal. Neck: Supple, no elevated JVP or carotid bruits. Lungs: Clear to auscultation, nonlabored breathing at rest. Cardiac: Regular rate and rhythm, no S3, crisp prosthetic click in S2 with soft systolic murmur, no gallop.  ECG:  An ECG dated 04/11/2021 was personally reviewed today and demonstrated:  Has rhythm with left atrial enlargement.  Labwork:  No interval lab work for review today.  Other Studies Reviewed Today:  No interval cardiac testing for review today.  Assessment and Plan:  1.  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  He remains on Coumadin with follow-up in anticoagulation clinic.  Follow-up echocardiogram in November 2022 revealed LVEF 60 to 65% with normally functioning aortic prosthesis, mean AV gradient 7.5 mmHg and trivial aortic regurgitation.  He remains symptomatically stable and plan is to continue observation at this point.  ECG is normal.  2.  Mixed hyperlipidemia.  He is on Lipitor with follow-up at the health department.  I do not have access to his recent lab work.  Disposition:  Follow up  1 year.  Signed, Jonelle Sidle, M.D., F.A.C.C. [facility]

## 2022-10-31 NOTE — Patient Instructions (Signed)
Medication Instructions:  Your physician recommends that you continue on your current medications as directed. Please refer to the Current Medication list given to you today.   Labwork: None today  Testing/Procedures: None today  Follow-Up: 1 year  Any Other Special Instructions Will Be Listed Below (If Applicable).  If you need a refill on your cardiac medications before your next appointment, please call your pharmacy.  

## 2022-11-04 ENCOUNTER — Ambulatory Visit: Payer: Self-pay | Attending: Cardiology | Admitting: *Deleted

## 2022-11-04 DIAGNOSIS — Z953 Presence of xenogenic heart valve: Secondary | ICD-10-CM

## 2022-11-04 DIAGNOSIS — Z5181 Encounter for therapeutic drug level monitoring: Secondary | ICD-10-CM

## 2022-11-04 LAB — POCT INR: INR: 2.3 (ref 2.0–3.0)

## 2022-11-04 NOTE — Patient Instructions (Signed)
Goal for Onyx valve decreased per D/C summary since being place 3 months ago. Continue warfarin 1 1/2 tablets daily except 1 tablet on Sundays, Tuesdays and Thursdays Continue greens or salad Recheck INR in 5 weeks

## 2022-12-09 ENCOUNTER — Ambulatory Visit: Payer: Self-pay | Attending: Cardiology | Admitting: *Deleted

## 2022-12-09 DIAGNOSIS — Z953 Presence of xenogenic heart valve: Secondary | ICD-10-CM

## 2022-12-09 DIAGNOSIS — Z5181 Encounter for therapeutic drug level monitoring: Secondary | ICD-10-CM

## 2022-12-09 LAB — POCT INR: INR: 2.4 (ref 2.0–3.0)

## 2022-12-09 NOTE — Patient Instructions (Signed)
Goal for Onyx valve decreased per D/C summary since being place 3 months ago. Continue warfarin 1 1/2 tablets daily except 1 tablet on Sundays, Tuesdays and Thursdays Continue greens or salad Recheck INR in 6 weeks

## 2023-01-20 ENCOUNTER — Telehealth: Payer: Self-pay | Admitting: Cardiology

## 2023-01-20 ENCOUNTER — Ambulatory Visit: Payer: Self-pay | Attending: Cardiology | Admitting: *Deleted

## 2023-01-20 DIAGNOSIS — Z5181 Encounter for therapeutic drug level monitoring: Secondary | ICD-10-CM

## 2023-01-20 DIAGNOSIS — Z953 Presence of xenogenic heart valve: Secondary | ICD-10-CM

## 2023-01-20 LAB — POCT INR: INR: 3.7 — AB (ref 2.0–3.0)

## 2023-01-20 NOTE — Telephone Encounter (Signed)
Spoke to [employer] at [facility].  OK to change manufacture.  Has INR follow up scheduled.

## 2023-01-20 NOTE — Patient Instructions (Signed)
Goal for Onyx valve decreased per D/C summary since being place 3 months ago. Hold warfarin today, take 1/2 tablet tomorrow then continue 1 1/2 tablets daily except 1 tablet on Sundays, Tuesdays and Thursdays Continue greens or salad Recheck INR in 3 weeks

## 2023-01-20 NOTE — Telephone Encounter (Signed)
Pt c/o medication issue:  1. Name of Medication:  warfarin (COUMADIN) 2 MG tablet  2. How are you currently taking this medication (dosage and times per day)?   3. Are you having a reaction (difficulty breathing--STAT)?   4. What is your medication issue?   [facility] would like to confirm whether it would be alright for them to change the manufacturing company for Warfarin. Please advise.

## 2023-02-11 ENCOUNTER — Ambulatory Visit: Payer: Self-pay | Attending: Cardiology | Admitting: *Deleted

## 2023-02-11 DIAGNOSIS — Z5181 Encounter for therapeutic drug level monitoring: Secondary | ICD-10-CM

## 2023-02-11 DIAGNOSIS — Z953 Presence of xenogenic heart valve: Secondary | ICD-10-CM

## 2023-02-11 LAB — POCT INR: INR: 2.5 (ref 2.0–3.0)

## 2023-02-11 NOTE — Patient Instructions (Signed)
Goal for Onyx valve decreased per D/C summary since being place 3 months ago. Continue warfarin 1 1/2 tablets daily except 1 tablet on Sundays, Tuesdays and Thursdays Continue greens or salad Recheck INR in 4 weeks 

## 2023-02-28 ENCOUNTER — Emergency Department (INDEPENDENT_AMBULATORY_CARE_PROVIDER_SITE_OTHER): Payer: Self-pay

## 2023-02-28 ENCOUNTER — Emergency Department (HOSPITAL_COMMUNITY)
Admission: EM | Admit: 2023-02-28 | Discharge: 2023-02-28 | Disposition: A | Discharge status: Home / Self Care | Payer: Self-pay | Attending: Emergency Medicine | Admitting: Emergency Medicine

## 2023-02-28 ENCOUNTER — Emergency Department (HOSPITAL_COMMUNITY): Payer: Self-pay

## 2023-02-28 ENCOUNTER — Other Ambulatory Visit: Payer: Self-pay

## 2023-02-28 ENCOUNTER — Encounter (HOSPITAL_COMMUNITY): Payer: Self-pay | Admitting: *Deleted

## 2023-02-28 ENCOUNTER — Telehealth: Payer: Self-pay

## 2023-02-28 DIAGNOSIS — R519 Headache, unspecified: Secondary | ICD-10-CM | POA: Insufficient documentation

## 2023-02-28 DIAGNOSIS — R079 Chest pain, unspecified: Secondary | ICD-10-CM | POA: Insufficient documentation

## 2023-02-28 DIAGNOSIS — R42 Dizziness and giddiness: Secondary | ICD-10-CM | POA: Insufficient documentation

## 2023-02-28 DIAGNOSIS — Z7982 Long term (current) use of aspirin: Secondary | ICD-10-CM | POA: Insufficient documentation

## 2023-02-28 DIAGNOSIS — R55 Syncope and collapse: Secondary | ICD-10-CM

## 2023-02-28 DIAGNOSIS — R112 Nausea with vomiting, unspecified: Secondary | ICD-10-CM | POA: Insufficient documentation

## 2023-02-28 DIAGNOSIS — Z7901 Long term (current) use of anticoagulants: Secondary | ICD-10-CM | POA: Insufficient documentation

## 2023-02-28 DIAGNOSIS — R001 Bradycardia, unspecified: Secondary | ICD-10-CM | POA: Insufficient documentation

## 2023-02-28 LAB — BASIC METABOLIC PANEL
Anion gap: 7 (ref 5–15)
BUN: 16 mg/dL (ref 6–20)
CO2: 24 mmol/L (ref 22–32)
Calcium: 8.2 mg/dL — ABNORMAL LOW (ref 8.9–10.3)
Chloride: 103 mmol/L (ref 98–111)
Creatinine, Ser: 0.66 mg/dL (ref 0.61–1.24)
GFR, Estimated: >60 mL/min (ref >60)
Glucose, Bld: 139 mg/dL — ABNORMAL HIGH (ref 70–99)
Potassium: 3.5 mmol/L (ref 3.5–5.1)
Sodium: 134 mmol/L — ABNORMAL LOW (ref 135–145)

## 2023-02-28 LAB — TROPONIN I (HIGH SENSITIVITY)
Troponin I (High Sensitivity): 23 ng/L — ABNORMAL HIGH (ref <18)
Troponin I (High Sensitivity): 25 ng/L — ABNORMAL HIGH (ref <18)

## 2023-02-28 LAB — CBC
HCT: 41.8 % (ref 39.0–52.0)
Hemoglobin: 14.2 g/dL (ref 13.0–17.0)
MCH: 30.7 pg (ref 26.0–34.0)
MCHC: 34 g/dL (ref 30.0–36.0)
MCV: 90.5 fL (ref 80.0–100.0)
Platelets: 308 10*3/uL (ref 150–400)
RBC: 4.62 MIL/uL (ref 4.22–5.81)
RDW: 13.3 % (ref 11.5–15.5)
WBC: 7.2 10*3/uL (ref 4.0–10.5)
nRBC: 0 % (ref 0.0–0.2)

## 2023-02-28 LAB — PROTIME-INR
INR: 2.5 — ABNORMAL HIGH (ref 0.8–1.2)
Prothrombin Time: 27.2 seconds — ABNORMAL HIGH (ref 11.4–15.2)

## 2023-02-28 NOTE — Telephone Encounter (Signed)
Secure Chat from Dr.McDowell:  Patient in ER bed 14.  Please place 14-day ZIO for syncope and arrange follow-up thereafter for review.  They are going to do orthostatics and get another troponin level so you have some time.   Order entered, applied to patient in ED with instructions for use given with spanish interpreter in the room. Patient to mail back on 03/14/23  Serial number [id number]

## 2023-02-28 NOTE — ED Triage Notes (Addendum)
Pt sent from health dept for CP and dizziness. Tuesday passed out at work, woke up with emesis.  Felt better once he got home.  Pt with continued left chest pain and dizziness.

## 2023-02-28 NOTE — ED Provider Notes (Signed)
  Physical Exam  BP (!) 153/84   Pulse 65   Temp 98.5 F (36.9 C)   Resp 20   Wt 96.6 kg   SpO2 94%   BMI 36.56 kg/m   Physical Exam  Procedures  Procedures  ED Course / MDM   Clinical Course as of 02/28/23 1749  Fri Feb 28, 2023  1420 Chest x-ray interpreted by me as no acute infiltrate.  Awaiting radiology reading. [MB]  1453 Reviewed with Dr. Diona Browner cardiology.  He thought with good heart sounds and therapeutic on his Coumadin that a valvular problem would be less likely and probably does not need an echo.  He is asking [place] to get some orthostatics and he said if his second troponin is not significantly rising he will try to get his nurse to get the patient set up with a ZIO to monitor in case he is having an intermittent arrhythmia. [MB]    Clinical Course User Index [MB] Terrilee Files, MD   Medical Decision Making Amount and/or Complexity of Data Reviewed Labs: ordered. Radiology: ordered.   Received in signout.  Dizziness syncope.  Not orthostatic and CT reassuring.  Will have Zio patch.  Discharged home with cardiology follow-up.       Benjiman Core, MD 02/28/23 1750

## 2023-02-28 NOTE — Progress Notes (Signed)
   02/28/23 1603  Spiritual Encounters  Type of Visit Initial  Care provided to: Pt and family  Conversation partners present during encounter Nurse  Referral source Chaplain assessment  Reason for visit Routine spiritual support  OnCall Visit No   Chaplain met Pt in his room, referred by Nurse in charge (Chaplain asked for anyone who might find useful to be helped in [place]). Pt was in his room with Pt's wife. Pt shared with Chaplain he was still surprised for what he felt and what actually brought him to the hospital. Pt and wife shared they are very careful with Pt's care and that they are calm. Pt and wife were grateful for Chaplain's visit.

## 2023-02-28 NOTE — ED Provider Notes (Signed)
[place] EMERGENCY DEPARTMENT AT [facility] Provider Note   CSN: [id number] Arrival date & time: 02/28/23  1303     History  Chief Complaint  Patient presents with   Chest Pain    Kevin Barron is a 57 y.o. male.  He has a history of aortic valve replacement and is on warfarin.  He said 4 days ago he was very dizzy at work and he passed out briefly.  He had some vomiting.  Since then he has had some intermittent dizziness and feeling some discomfort in his left chest.  He went to the health center today and they referred him here for further evaluation.  He said he has had the dizziness before but never this significant.  He has been taking his medications regularly.  He has a little bit of headache.  No numbness or weakness.  No fevers or chills blurry vision double vision shortness of breath.  The history is provided by the patient. The history is limited by a language barrier. A language interpreter was used (ipad).  Dizziness Quality:  Lightheadedness Severity:  Moderate Onset quality:  Sudden Duration:  4 days Timing:  Intermittent Progression:  Unchanged Chronicity:  Recurrent Relieved by:  Lying down Associated symptoms: chest pain, headaches, nausea and vomiting   Associated symptoms: no shortness of breath and no vision changes        Home Medications Prior to Admission medications   Medication Sig Start Date End Date Taking? Authorizing Provider  amoxicillin (AMOXIL) 500 MG tablet Take 4 tablets (2000 mg) before any dental procedures. 04/11/21   Corrin Parker, PA-C  aspirin EC 81 MG tablet Take 81 mg by mouth daily. Swallow whole.    [provider]  atorvastatin (LIPITOR) 80 MG tablet Take 1 tablet (80 mg total) by mouth daily. 07/18/21 10/31/22  Ellsworth Lennox, PA-C  warfarin (COUMADIN) 2 MG tablet Take 1 1/2 tablets daily except 1 tablet on Sundays, Tuesdays and Thursdays or as directed. 07/22/22   Jonelle Sidle, MD       Allergies    Patient has no known allergies.    Review of Systems   Review of Systems  Constitutional:  Negative for fever.  Eyes:  Negative for visual disturbance.  Respiratory:  Negative for shortness of breath.   Cardiovascular:  Positive for chest pain.  Gastrointestinal:  Positive for nausea and vomiting.  Neurological:  Positive for dizziness, syncope and headaches.    Physical Exam Updated Vital Signs BP 120/73   Pulse 60   Temp 98.2 F (36.8 C) (Oral)   Resp 14   Wt 96.6 kg   SpO2 97%   BMI 36.56 kg/m  Physical Exam Vitals and nursing note reviewed.  Constitutional:      General: He is not in acute distress.    Appearance: He is well-developed.  HENT:     Head: Normocephalic and atraumatic.  Eyes:     Conjunctiva/sclera: Conjunctivae normal.  Cardiovascular:     Rate and Rhythm: Normal rate and regular rhythm.     Heart sounds: Normal heart sounds. No murmur heard. Pulmonary:     Effort: Pulmonary effort is normal. No respiratory distress.     Breath sounds: Normal breath sounds.  Abdominal:     Palpations: Abdomen is soft.     Tenderness: There is no abdominal tenderness.  Musculoskeletal:        General: No swelling. Normal range of motion.     Cervical back:  Neck supple.     Right lower leg: No tenderness. No edema.     Left lower leg: No tenderness. No edema.  Skin:    General: Skin is warm and dry.     Capillary Refill: Capillary refill takes less than 2 seconds.  Neurological:     General: No focal deficit present.     Mental Status: He is alert.     ED Results / Procedures / Treatments   Labs (all labs ordered are listed, but only abnormal results are displayed) Labs Reviewed  BASIC METABOLIC PANEL - Abnormal; Notable for the following components:      Result Value   Sodium 134 (*)    Glucose, Bld 139 (*)    Calcium 8.2 (*)    All other components within normal limits  PROTIME-INR - Abnormal; Notable for the following components:    Prothrombin Time 27.2 (*)    INR 2.5 (*)    All other components within normal limits  TROPONIN I (HIGH SENSITIVITY) - Abnormal; Notable for the following components:   Troponin I (High Sensitivity) 23 (*)    All other components within normal limits  TROPONIN I (HIGH SENSITIVITY) - Abnormal; Notable for the following components:   Troponin I (High Sensitivity) 25 (*)    All other components within normal limits  CBC    EKG EKG Interpretation Date/Time:  Friday February 28 2023 13:21:06 EDT Ventricular Rate:  56 PR Interval:  172 QRS Duration:  88 QT Interval:  406 QTC Calculation: 391 R Axis:   -34  Text Interpretation: Sinus bradycardia Left axis deviation Minimal voltage criteria for LVH, may be normal variant ( R in aVL ) Abnormal ECG When compared with ECG of 03-Apr-2021 06:55, ST no longer elevated in Inferior leads ST no longer elevated in Anterior leads Nonspecific T wave abnormality now evident in Anterior leads Nonspecific T wave abnormality has replaced inverted T waves in Lateral leads QT has shortened Confirmed by Meridee Score [phone]) on 02/28/2023 2:10:37 PM  Radiology CT Head Wo Contrast  Result Date: 02/28/2023 CLINICAL DATA:  Recent syncopal episode.  Headache and dizziness. EXAM: CT HEAD WITHOUT CONTRAST TECHNIQUE: Contiguous axial images were obtained from the base of the skull through the vertex without intravenous contrast. RADIATION DOSE REDUCTION: This exam was performed according to the departmental dose-optimization program which includes automated exposure control, adjustment of the mA and/or kV according to patient size and/or use of iterative reconstruction technique. COMPARISON:  None Available. FINDINGS: Brain: No evidence of intracranial hemorrhage, acute infarction, hydrocephalus, extra-axial collection, or mass lesion/mass effect. Vascular:  No hyperdense vessel or other acute findings. Skull: No evidence of fracture or other significant bone abnormality.  Sinuses/Orbits:  No acute findings. Other: None. IMPRESSION: Negative noncontrast head CT. Electronically Signed   By: Danae Orleans M.D.   On: 02/28/2023 16:36   DG Chest 2 View  Result Date: 02/28/2023 CLINICAL DATA:  Chest pain. Chest pain and pressure for 2 days. 2 days ago passed out at work and woke up with emesis. EXAM: CHEST - 2 VIEW COMPARISON:  Chest radiographs 05/17/2021 and 04/06/2021 FINDINGS: The cardiac silhouette is again mildly enlarged. Mediastinal contours within normal limits. Status post median sternotomy. Mildly decreased lung volumes. Mild right basilar horizontal linear subsegmental atelectasis versus scarring is similar to 04/06/2021. Otherwise, the bilateral lungs are clear. No pleural effusion pneumothorax. Mild multilevel degenerative disc changes of the thoracic spine. IMPRESSION: Mild cardiomegaly. No acute cardiopulmonary process. Electronically Signed   By:  Neita Garnet M.D.   On: 02/28/2023 14:52    Procedures Procedures    Medications Ordered in ED Medications - No data to display  ED Course/ Medical Decision Making/ A&P Clinical Course as of 02/28/23 1704  Fri Feb 28, 2023  1420 Chest x-ray interpreted by me as no acute infiltrate.  Awaiting radiology reading. [MB]  1453 Reviewed with Dr. Diona Browner cardiology.  He thought with good heart sounds and therapeutic on his Coumadin that a valvular problem would be less likely and probably does not need an echo.  He is asking [place] to get some orthostatics and he said if his second troponin is not significantly rising he will try to get his nurse to get the patient set up with a ZIO to monitor in case he is having an intermittent arrhythmia. [MB]    Clinical Course User Index [MB] Terrilee Files, MD                                 Medical Decision Making Amount and/or Complexity of Data Reviewed Labs: ordered. Radiology: ordered.   This patient complains of dizziness and syncopal event; this involves an  extensive number of treatment Options and is a complaint that carries with it a high risk of complications and morbidity. The differential includes orthostatic, arrhythmia, dehydration, anemia, ACS, stroke, bleed  I ordered, reviewed and interpreted labs, which included CBC unremarkable, chemistries with mildly low sodium elevated glucose, INR therapeutic troponins mildly elevated need to be trended I ordered imaging studies which included chest x-ray and head CT and I independently    visualized and interpreted imaging which showed no acute findings Previous records obtained and reviewed in epic including recent cardiology note I consulted cardiology Dr. Diona Browner and discussed lab and imaging findings and discussed disposition.  Cardiac monitoring reviewed, sinus rhythm Social determinants considered, no significant barriers Critical Interventions: None  After the interventions stated above, I reevaluated the patient and found resting comfortably hemodynamically stable Admission and further testing considered, his care is signed out to Dr. Rubin Payor to follow-up on second troponin and results of head CT.  Likely can be discharged and outpatient follow-up with cardiology team and PCP.         Final Clinical Impression(s) / ED Diagnoses Final diagnoses:  Dizziness    Rx / DC Orders ED Discharge Orders     None         Terrilee Files, MD 02/28/23 1705

## 2023-03-12 ENCOUNTER — Ambulatory Visit: Payer: Self-pay | Attending: Cardiology | Admitting: *Deleted

## 2023-03-12 DIAGNOSIS — Z5181 Encounter for therapeutic drug level monitoring: Secondary | ICD-10-CM

## 2023-03-12 DIAGNOSIS — Z953 Presence of xenogenic heart valve: Secondary | ICD-10-CM

## 2023-03-12 LAB — POCT INR: INR: 3.2 — AB (ref 2.0–3.0)

## 2023-03-12 NOTE — Patient Instructions (Signed)
Goal for Onyx valve decreased per D/C summary since being place 3 months ago. Hold warfarin tonight then decrease dose to 1 tablet daily except 1 1/2 tablets on Mondays, Wednesdays and Fridays Continue greens or salad Recheck INR in 3 weeks

## 2023-03-27 ENCOUNTER — Ambulatory Visit: Payer: Self-pay | Attending: Cardiology | Admitting: Cardiology

## 2023-03-27 ENCOUNTER — Encounter: Payer: Self-pay | Admitting: Cardiology

## 2023-03-27 VITALS — BP 120/68 | HR 74 | Ht 62.0 in | Wt 215.0 lb

## 2023-03-27 DIAGNOSIS — R55 Syncope and collapse: Secondary | ICD-10-CM

## 2023-03-27 DIAGNOSIS — Z952 Presence of prosthetic heart valve: Secondary | ICD-10-CM

## 2023-03-27 NOTE — Progress Notes (Signed)
Cardiology Office Note  Date: 03/27/2023   ID: Kevin Barron, DOB Feb 12, 1966, MRN [id number]  History of Present Illness: Kevin Barron is a 57 y.o. male last seen in April.  He presents for a follow-up visit.  He is here today with his wife and a Spanish language interpreter.  He was seen in the ER in late August following an episode of lightheadedness and reportedly brief syncope.  Cardiac enzymes were not consistent with ACS, he was not found to be orthostatic at that time.  INR was therapeutic at 2.5.  He was discharged home with an outpatient cardiac monitor.  We discussed his symptoms today.  He states that he was at work, indoors and moving some things around at home, not straining.  He felt fine and then all of a sudden felt lightheaded and weak, dropped down to the ground briefly.  He felt weak for a few hours, nauseated and did have an episode of emesis at 1 point.  No chest pain.  No sense of palpitations.  Since that time he has had no recurrent symptoms.  His cardiac monitor was only able to stay on for about 4 days and did not demonstrate any specific arrhythmias to explain his event.  He remains on Coumadin with follow-up in the anticoagulation clinic.  INR was 3.2 on September 4.  Orthostatic measurements today were normal.  Physical Exam: VS:  BP 120/68 (BP Location: Left Arm, Patient Position: Sitting, Cuff Size: Normal)   Pulse 74   Ht 5\' 2"  (1.575 m)   Wt 215 lb (97.5 kg)   SpO2 93%   BMI 39.32 kg/m , BMI Body mass index is 39.32 kg/m.  Wt Readings from Last 3 Encounters:  03/27/23 215 lb (97.5 kg)  02/28/23 213 lb (96.6 kg)  10/31/22 214 lb (97.1 kg)    General: Patient appears comfortable at rest. HEENT: Conjunctiva and lids normal. Neck: Supple, no elevated JVP or carotid bruits. Lungs: Clear to auscultation, nonlabored breathing at rest. Cardiac: Regular rate and rhythm, no S3, 1/6 systolic murmur.  No gallop.  ECG:  An ECG dated  02/28/2023 was personally reviewed today and demonstrated:  Sinus bradycardia with leftward axis and increased voltage.  Labwork: 02/28/2023: BUN 16; Creatinine, Ser 0.66; Hemoglobin 14.2; Platelets 308; Potassium 3.5; Sodium 134   Other Studies Reviewed Today:  Cardiac monitor September 2024: ZIO monitor reviewed.  3 days, 23 hours analyzed.   Predominant rhythm is sinus with heart rate ranging from 51 bpm up to 156 bpm with average heart rate 81 bpm. There were rare PACs including atrial couplets representing less than 1% total beats.  Single 5 beat episode of SVT noted.  No sustained atrial events. There were rare PVCs representing less than 1% total beats with a single 4 beat run of NSVT.  No sustained ventricular events. No pauses or heart block. No patient triggered events.  Assessment and Plan:  1.  Episode of lightheadedness and syncope as discussed above.  Description does sound arrhythmogenic, although no specific arrhythmias were documented including by subsequent outpatient cardiac monitor.  Would be suspicious potentially of conduction system disease with history of aortic stenosis status post mechanical AVR.  He is not orthostatic today with normal blood pressure.  He is not on any AV nodal blockers.  At this point plan observation, if he continues to have unexplained events, an ILR could be considered.  2.  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  He remains on Coumadin with follow-up in anticoagulation clinic.  Plan to obtain a follow-up echocardiogram to ensure no change in LVEF or valvular status in light of the above symptoms.  Exam is reassuring however.   3.  Mixed hyperlipidemia.  He is on Lipitor with follow-up at the health department.  Disposition:  Follow up  6 months, sooner if needed.  Signed, Jonelle Sidle, M.D., F.A.C.C. [facility] at [facility]

## 2023-03-27 NOTE — Patient Instructions (Signed)
Medication Instructions:  Your physician recommends that you continue on your current medications as directed. Please refer to the Current Medication list given to you today.   Labwork: None today   Testing/Procedures: Your physician has requested that you have an echocardiogram. Echocardiography is a painless test that uses sound waves to create images of your heart. It provides your doctor with information about the size and shape of your heart and how well your heart's chambers and valves are working. This procedure takes approximately one hour. There are no restrictions for this procedure. Please do NOT wear cologne, perfume, aftershave, or lotions (deodorant is allowed). Please arrive 15 minutes prior to your appointment time.   Follow-Up: 6 months  Any Other Special Instructions Will Be Listed Below (If Applicable).  If you need a refill on your cardiac medications before your next appointment, please call your pharmacy.  

## 2023-04-02 ENCOUNTER — Ambulatory Visit: Payer: Self-pay | Attending: Cardiology | Admitting: *Deleted

## 2023-04-02 DIAGNOSIS — Z953 Presence of xenogenic heart valve: Secondary | ICD-10-CM

## 2023-04-02 DIAGNOSIS — Z5181 Encounter for therapeutic drug level monitoring: Secondary | ICD-10-CM

## 2023-04-02 LAB — POCT INR: INR: 2.8 (ref 2.0–3.0)

## 2023-04-02 NOTE — Patient Instructions (Signed)
Goal for Onyx valve decreased per D/C summary since being place 3 months ago. Decrease warfarin to 1 tablet daily except 1 1/2 tablets on Mondays and Fridays Continue greens or salad Recheck INR in 4 weeks

## 2023-04-29 ENCOUNTER — Ambulatory Visit (HOSPITAL_COMMUNITY)
Admission: RE | Admit: 2023-04-29 | Discharge: 2023-04-29 | Disposition: A | Discharge status: Home / Self Care | Payer: Self-pay | Source: Ambulatory Visit | Attending: Cardiology | Admitting: Cardiology

## 2023-04-29 DIAGNOSIS — R55 Syncope and collapse: Secondary | ICD-10-CM | POA: Insufficient documentation

## 2023-04-29 LAB — ECHOCARDIOGRAM COMPLETE
AV Mean grad: 7.3 mm[Hg]
AV Peak grad: 14 mm[Hg]
Ao pk vel: 1.87 m/s
Area-P 1/2: 3.85 cm2
S' Lateral: 2.8 cm

## 2023-04-29 NOTE — Progress Notes (Signed)
  Echocardiogram 2D Echocardiogram has been performed.  Kevin Barron 04/29/2023, 10:20 AM

## 2023-04-30 ENCOUNTER — Ambulatory Visit: Payer: Self-pay | Attending: Cardiology | Admitting: *Deleted

## 2023-04-30 DIAGNOSIS — Z5181 Encounter for therapeutic drug level monitoring: Secondary | ICD-10-CM

## 2023-04-30 DIAGNOSIS — Z953 Presence of xenogenic heart valve: Secondary | ICD-10-CM

## 2023-04-30 LAB — POCT INR: INR: 2.5 (ref 2.0–3.0)

## 2023-04-30 NOTE — Patient Instructions (Signed)
Goal for Onyx valve decreased per D/C summary since being place 3 months ago. Continue warfarin 1 tablet daily except 1 1/2 tablets on Mondays and Fridays Continue greens or salad Recheck INR in 4 weeks

## 2023-05-28 ENCOUNTER — Ambulatory Visit: Payer: Self-pay | Attending: Cardiology

## 2023-05-28 DIAGNOSIS — Z5181 Encounter for therapeutic drug level monitoring: Secondary | ICD-10-CM

## 2023-05-28 DIAGNOSIS — Z953 Presence of xenogenic heart valve: Secondary | ICD-10-CM

## 2023-05-28 DIAGNOSIS — Z952 Presence of prosthetic heart valve: Secondary | ICD-10-CM

## 2023-05-28 LAB — POCT INR: INR: 2.3 (ref 2.0–3.0)

## 2023-05-28 NOTE — Patient Instructions (Signed)
Description   Goal for Onyx valve decreased per D/C summary since being place 3 months ago. Continue warfarin 1 tablet daily except 1 1/2 tablets on Mondays and Fridays Continue greens or salad Recheck INR in 4 weeks

## 2023-06-25 ENCOUNTER — Ambulatory Visit: Payer: Self-pay | Attending: Cardiology | Admitting: *Deleted

## 2023-06-25 DIAGNOSIS — Z5181 Encounter for therapeutic drug level monitoring: Secondary | ICD-10-CM

## 2023-06-25 DIAGNOSIS — Z953 Presence of xenogenic heart valve: Secondary | ICD-10-CM

## 2023-06-25 LAB — POCT INR: INR: 1.6 — AB (ref 2.0–3.0)

## 2023-06-25 NOTE — Patient Instructions (Signed)
Goal for Onyx valve decreased per D/C summary since being place 3 months ago. Take warfarin 1 1/2 tablets tonight then continue 1 tablet daily except 1 1/2 tablets on Mondays and Fridays Continue greens or salad Recheck INR in 4 weeks

## 2023-07-23 ENCOUNTER — Ambulatory Visit: Payer: Self-pay | Attending: Cardiology | Admitting: *Deleted

## 2023-07-23 DIAGNOSIS — Z5181 Encounter for therapeutic drug level monitoring: Secondary | ICD-10-CM

## 2023-07-23 DIAGNOSIS — Z953 Presence of xenogenic heart valve: Secondary | ICD-10-CM

## 2023-07-23 LAB — POCT INR: INR: 2.1 (ref 2.0–3.0)

## 2023-07-23 NOTE — Patient Instructions (Signed)
 Goal for Onyx valve decreased per D/C summary since being place 3 months ago. Continue warfarin 1 tablet daily except 1 1/2 tablets on Mondays and Fridays Continue greens or salad Recheck INR in 4 weeks

## 2023-08-02 ENCOUNTER — Other Ambulatory Visit: Payer: Self-pay | Admitting: Cardiology

## 2023-08-04 NOTE — Telephone Encounter (Signed)
Refill request for warfarin:  Last INR was 2.1 on 07/23/23 Next INR due on 08/20/23 LOV was 03/27/23  Refill approved.

## 2023-08-20 ENCOUNTER — Ambulatory Visit: Payer: Self-pay | Attending: Cardiology | Admitting: *Deleted

## 2023-08-20 DIAGNOSIS — Z5181 Encounter for therapeutic drug level monitoring: Secondary | ICD-10-CM

## 2023-08-20 DIAGNOSIS — Z953 Presence of xenogenic heart valve: Secondary | ICD-10-CM

## 2023-08-20 LAB — POCT INR: INR: 2.4 (ref 2.0–3.0)

## 2023-08-20 NOTE — Patient Instructions (Signed)
Goal for Onyx valve decreased per D/C summary since being place 3 months ago. Continue warfarin 1 tablet daily except 1 1/2 tablets on Mondays and Fridays Continue greens or salad Recheck INR in 4 weeks

## 2023-09-17 ENCOUNTER — Ambulatory Visit: Payer: Self-pay | Attending: Cardiology | Admitting: *Deleted

## 2023-09-17 DIAGNOSIS — Z953 Presence of xenogenic heart valve: Secondary | ICD-10-CM

## 2023-09-17 DIAGNOSIS — Z5181 Encounter for therapeutic drug level monitoring: Secondary | ICD-10-CM

## 2023-09-17 LAB — POCT INR: INR: 1.3 — AB (ref 2.0–3.0)

## 2023-09-17 NOTE — Patient Instructions (Signed)
 Goal for Onyx valve decreased per D/C summary since being place 3 months ago. Take warfarin 2 tablets tonight, 1 1/2 tablets tomorrow night then resume 1 tablet daily except 1 1/2 tablets on Mondays and Fridays Continue greens or salad Recheck INR in 2 weeks

## 2023-10-06 ENCOUNTER — Ambulatory Visit: Payer: Self-pay | Attending: Cardiology | Admitting: *Deleted

## 2023-10-06 DIAGNOSIS — Z5181 Encounter for therapeutic drug level monitoring: Secondary | ICD-10-CM

## 2023-10-06 DIAGNOSIS — Z953 Presence of xenogenic heart valve: Secondary | ICD-10-CM

## 2023-10-06 LAB — POCT INR: INR: 2 (ref 2.0–3.0)

## 2023-10-06 NOTE — Patient Instructions (Signed)
 Goal for Onyx valve decreased per D/C summary since being place 3 months ago. Continue warfarin 1 tablet daily except 1 1/2 tablets on Mondays and Fridays Continue greens or salad Recheck INR in 3 weeks

## 2023-10-29 ENCOUNTER — Ambulatory Visit: Payer: Self-pay | Attending: Cardiology | Admitting: *Deleted

## 2023-10-29 DIAGNOSIS — Z953 Presence of xenogenic heart valve: Secondary | ICD-10-CM

## 2023-10-29 DIAGNOSIS — Z5181 Encounter for therapeutic drug level monitoring: Secondary | ICD-10-CM

## 2023-10-29 LAB — POCT INR: INR: 2 (ref 2.0–3.0)

## 2023-10-29 NOTE — Patient Instructions (Signed)
 Goal for Onyx valve decreased per D/C summary since being place 3 months ago. Continue warfarin 1 tablet daily except 1 1/2 tablets on Mondays and Fridays Continue greens or salad Recheck INR in 4 weeks

## 2023-11-13 ENCOUNTER — Ambulatory Visit: Payer: Self-pay | Admitting: Cardiology

## 2023-11-24 ENCOUNTER — Telehealth: Payer: Self-pay

## 2023-11-24 NOTE — Telephone Encounter (Signed)
 Called [facility] with interpreter # [phone] He was last seen at [facility] on 03/14/23. He was scheduled for 05/29/23 and missed that appointment. He is also being followed by [facility].  Assisted client on a 3 way call to [facility] and helped him schedule a follow up appointment with interpreter. He is scheduled for 11/26/23 at [facility] and to arrive at 1:45 PM He is reminded he also has an appointment that morning at 0930 at [facility] in [place] for Coumadin  follow up.   Client requested text message be sent for the health department appointment and address. Information texted to client at his request.  Reviewed medications: he states he is "not taking anything for his blood sugars, I do not need them and I am feeling fine" He was last prescribed Januvia at [facility]. Last A1C recorded was 7.2 02/28/23.  Will plan to follow up after appointments.   Kris Pester RN [employer]

## 2023-11-26 ENCOUNTER — Ambulatory Visit: Payer: Self-pay | Attending: Cardiology | Admitting: *Deleted

## 2023-11-26 DIAGNOSIS — Z953 Presence of xenogenic heart valve: Secondary | ICD-10-CM

## 2023-11-26 DIAGNOSIS — Z5181 Encounter for therapeutic drug level monitoring: Secondary | ICD-10-CM

## 2023-11-26 LAB — POCT INR: INR: 2.2 (ref 2.0–3.0)

## 2023-11-26 MED ORDER — WARFARIN SODIUM 2 MG PO TABS: ORAL_TABLET | ORAL | 1 refills | Status: AC

## 2023-11-26 NOTE — Patient Instructions (Signed)
 Goal for Onyx valve decreased per D/C summary since being place 3 months ago. Continue warfarin 1 tablet daily except 1 1/2 tablets on Mondays and Fridays Continue greens or salad Recheck INR in 6 weeks

## 2023-12-03 ENCOUNTER — Telehealth: Payer: Self-pay

## 2023-12-03 NOTE — Telephone Encounter (Signed)
 Attempted follow up with interpreter services, no answer, unable to leave message. Calling to follow up if client went to [facility] on 11/26/23 for follow up.   Will continue to attempt to reach.  Kris Pester RN [employer]

## 2024-01-07 ENCOUNTER — Ambulatory Visit: Payer: Self-pay | Attending: Cardiology | Admitting: *Deleted

## 2024-01-07 DIAGNOSIS — Z953 Presence of xenogenic heart valve: Secondary | ICD-10-CM

## 2024-01-07 DIAGNOSIS — Z5181 Encounter for therapeutic drug level monitoring: Secondary | ICD-10-CM

## 2024-01-07 LAB — POCT INR: INR: 1.7 — AB (ref 2.0–3.0)

## 2024-01-07 NOTE — Progress Notes (Signed)
Please see anticoagulation encounter.

## 2024-01-07 NOTE — Patient Instructions (Signed)
 Goal for Onyx valve decreased per D/C summary since being place 3 months ago. Take warfarin 1 1/2 tablets today then resume 1 tablet daily except 1 1/2 tablets on Mondays and Fridays Continue greens or salad Recheck INR in 6 weeks

## 2024-01-28 ENCOUNTER — Ambulatory Visit: Payer: Self-pay | Admitting: Cardiology

## 2024-01-28 ENCOUNTER — Ambulatory Visit: Payer: Self-pay | Attending: Cardiology | Admitting: Cardiology

## 2024-01-28 ENCOUNTER — Encounter: Payer: Self-pay | Admitting: Cardiology

## 2024-01-28 ENCOUNTER — Encounter: Payer: Self-pay | Admitting: Physician Assistant

## 2024-01-28 ENCOUNTER — Encounter: Payer: Self-pay | Admitting: *Deleted

## 2024-01-28 VITALS — BP 134/74 | HR 70 | Ht 63.0 in | Wt 218.4 lb

## 2024-01-28 DIAGNOSIS — Z953 Presence of xenogenic heart valve: Secondary | ICD-10-CM

## 2024-01-28 DIAGNOSIS — Z952 Presence of prosthetic heart valve: Secondary | ICD-10-CM

## 2024-01-28 DIAGNOSIS — E782 Mixed hyperlipidemia: Secondary | ICD-10-CM

## 2024-01-28 NOTE — Progress Notes (Signed)
    Cardiology Office Note  Date: 01/28/2024   ID: Kevin Barron, DOB 1966-06-09, MRN [id number]  History of Present Illness: Kevin Barron is a 58 y.o. male last seen in September 2024.  He is here with his wife for a follow-up visit.  Spanish language interpreter utilized throughout.  He indicates that he has been doing very well, no interval episodes of dizziness or syncope, no exertional chest pain.  We went over his medications.  He continues on Coumadin  with follow-up in the anticoagulation clinic.  He reports compliance.  No longer on Lipitor, we are requesting his interval lab work from the health department.  He had apparently been on metformin  as well, however stopped this due to not feeling well on the medication.  I reviewed his ECG today which shows normal sinus rhythm with leftward axis and nonspecific ST changes.  I also reviewed his echocardiogram from October 2024.  Physical Exam: VS:  BP 134/74 (BP Location: Left Arm)   Pulse 70   Ht 5' 3 (1.6 m)   Wt 218 lb 6.4 oz (99.1 kg)   SpO2 93%   BMI 38.69 kg/m , BMI Body mass index is 38.69 kg/m.  Wt Readings from Last 3 Encounters:  01/28/24 218 lb 6.4 oz (99.1 kg)  03/27/23 215 lb (97.5 kg)  02/28/23 213 lb (96.6 kg)    General: Patient appears comfortable at rest. HEENT: Conjunctiva and lids normal. Neck: Supple, no elevated JVP or carotid bruits. Lungs: Clear to auscultation, nonlabored breathing at rest. Cardiac: Regular rate and rhythm, no S3, 1/6 systolic murmur with prosthetic click in S2.  ECG:  An ECG dated 02/28/2023 was personally reviewed today and demonstrated:  Bradycardia with leftward axis and increased voltage.  Labwork: 02/28/2023: BUN 16; Creatinine, Ser 0.66; Hemoglobin 14.2; Platelets 308; Potassium 3.5; Sodium 134   Other Studies Reviewed Today:  Interval cardiac testing for review today.  Assessment and Plan:  1.  History of severe aortic stenosis with functionally  bicuspid aortic valve status post placement of 23 mm On-X mechanical AVR in September 2022.  He remains on Coumadin  with follow-up in anticoagulation clinic.  Follow-up echocardiogram in October 2024 revealed LVEF 60 to 65% with normally functioning prosthesis, mean AV gradient 7.3 mmHg and no aortic regurgitation.  Doing well with NYHA class I dyspnea.   2.  Mixed hyperlipidemia.  No longer on Lipitor.  Requesting interval lab work from the health department for review.  3.  No further spells of lightheadedness/syncope.  Cardiac monitor from September did not reveal any obvious correlating arrhythmias.  Will continue with observation at this time.  Disposition:  Follow up 6 months.  Signed, Jayson JUDITHANN Sierras, M.D., F.A.C.C. [facility] at [facility]

## 2024-01-28 NOTE — Patient Instructions (Addendum)

## 2024-02-03 MED ORDER — ATORVASTATIN CALCIUM 40 MG PO TABS: 40.0000 mg | ORAL_TABLET | Freq: Every day | ORAL | 3 refills | Status: AC

## 2024-02-18 ENCOUNTER — Ambulatory Visit: Payer: Self-pay | Attending: Cardiology | Admitting: *Deleted

## 2024-02-18 DIAGNOSIS — Z5181 Encounter for therapeutic drug level monitoring: Secondary | ICD-10-CM

## 2024-02-18 DIAGNOSIS — Z953 Presence of xenogenic heart valve: Secondary | ICD-10-CM

## 2024-02-18 LAB — POCT INR: INR: 2 (ref 2.0–3.0)

## 2024-02-18 NOTE — Patient Instructions (Signed)
 Goal for Onyx valve decreased per D/C summary since being place 3 months ago. Continue warfarin 1 tablet daily except 1 1/2 tablets on Mondays and Fridays Continue greens or salad Recheck INR in 6 weeks

## 2024-02-18 NOTE — Progress Notes (Signed)
 INR 2.0 Please see anticoagulation encounter.

## 2024-03-31 ENCOUNTER — Ambulatory Visit: Payer: Self-pay | Attending: Cardiology | Admitting: *Deleted

## 2024-03-31 DIAGNOSIS — Z5181 Encounter for therapeutic drug level monitoring: Secondary | ICD-10-CM

## 2024-03-31 DIAGNOSIS — Z953 Presence of xenogenic heart valve: Secondary | ICD-10-CM

## 2024-03-31 LAB — POCT INR: INR: 1.7 — AB (ref 2.0–3.0)

## 2024-03-31 NOTE — Patient Instructions (Signed)
 Goal for Onyx valve decreased per D/C summary since being place 3 months ago. Take warfarin 1 1/2 tablets tonight then resume 1 tablet daily except 1 1/2 tablets on Mondays and Fridays Continue greens or salad Recheck INR in 6 weeks

## 2024-03-31 NOTE — Progress Notes (Signed)
 INR 1.7; Please see anticoagulation encounter

## 2024-05-12 ENCOUNTER — Ambulatory Visit: Payer: Self-pay | Attending: Cardiology | Admitting: *Deleted

## 2024-05-12 DIAGNOSIS — Z953 Presence of xenogenic heart valve: Secondary | ICD-10-CM

## 2024-05-12 DIAGNOSIS — Z5181 Encounter for therapeutic drug level monitoring: Secondary | ICD-10-CM

## 2024-05-12 LAB — POCT INR: INR: 1.5 — AB (ref 2.0–3.0)

## 2024-05-12 NOTE — Progress Notes (Signed)
 INR-1.5; Please see anticoagulation encounter

## 2024-05-12 NOTE — Patient Instructions (Signed)
Goal for Onyx valve decreased per D/C summary since being place 3 months ago. ?Increase warfarin to 1 tablet daily except 1 1/2 tablets on Mondays, Wednesdays and Fridays ?Continue greens or salad ?Recheck INR in 3 weeks ?

## 2024-06-02 ENCOUNTER — Ambulatory Visit: Payer: Self-pay | Attending: Cardiology | Admitting: *Deleted

## 2024-06-02 DIAGNOSIS — Z5181 Encounter for therapeutic drug level monitoring: Secondary | ICD-10-CM

## 2024-06-02 DIAGNOSIS — Z953 Presence of xenogenic heart valve: Secondary | ICD-10-CM

## 2024-06-02 LAB — POCT INR: INR: 1.8 — AB (ref 2.0–3.0)

## 2024-06-02 NOTE — Progress Notes (Signed)
 INR 1.8. Please see anticoagulation encounter

## 2024-06-02 NOTE — Patient Instructions (Signed)
 Goal for Onyx valve decreased per D/C summary since being place 3 months ago. Continue warfarin 1 tablet daily except 1 1/2 tablets on Mondays, Wednesdays and Fridays Continue greens or salad Recheck INR in 5 weeks

## 2024-06-11 ENCOUNTER — Telehealth: Payer: Self-pay

## 2024-06-11 NOTE — Telephone Encounter (Signed)
 Attempted call for follow up with newly renewed [facility] client (new/old) and aptient of [facility]. No answer, was unable to leave a message today.  Client re-established care with [facility] and was last seen on 06/02/24. Noted that he had stopped taking his diabetes medications and admits to not following diet.  A1C increased to 10.6 Next visit scheduled for 09/01/24  Will mail client welcome [facility] letter as well as Diabetes education, MyPlate , Diet and Nutrition, Type 2 Diabetes and what are A1C numbers and foot care in Spanish to client.  Will continue to attempt to connect with client for support and education.  Avelina JONELLE Skeen RN [employer]

## 2024-07-07 ENCOUNTER — Ambulatory Visit: Payer: Self-pay | Attending: Cardiology | Admitting: *Deleted

## 2024-07-07 DIAGNOSIS — Z953 Presence of xenogenic heart valve: Secondary | ICD-10-CM

## 2024-07-07 DIAGNOSIS — Z5181 Encounter for therapeutic drug level monitoring: Secondary | ICD-10-CM

## 2024-07-07 LAB — POCT INR: INR: 2 (ref 2.0–3.0)

## 2024-07-07 NOTE — Patient Instructions (Signed)
 Goal for Onyx valve decreased per D/C summary since being place 3 months ago. Continue warfarin 1 tablet daily except 1 1/2 tablets on Mondays, Wednesdays and Fridays Continue greens or salad Recheck INR in 6 weeks

## 2024-07-07 NOTE — Progress Notes (Signed)
 INR 2.0 Please see anticoagulation encounter.

## 2024-08-18 ENCOUNTER — Ambulatory Visit: Payer: Self-pay
